# Patient Record
Sex: Female | Born: 1975 | Race: White | Hispanic: No | Marital: Married | Smoking: Never smoker
Health system: Southern US, Community
[De-identification: ages and names within clinical notes are randomized; demographics above are authoritative.]

## PROBLEM LIST (undated history)

## (undated) DIAGNOSIS — F419 Anxiety disorder, unspecified: Secondary | ICD-10-CM

## (undated) DIAGNOSIS — I10 Essential (primary) hypertension: Secondary | ICD-10-CM

## (undated) DIAGNOSIS — G4733 Obstructive sleep apnea (adult) (pediatric): Secondary | ICD-10-CM

## (undated) DIAGNOSIS — M1990 Unspecified osteoarthritis, unspecified site: Secondary | ICD-10-CM

## (undated) DIAGNOSIS — E119 Type 2 diabetes mellitus without complications: Secondary | ICD-10-CM

## (undated) DIAGNOSIS — K219 Gastro-esophageal reflux disease without esophagitis: Secondary | ICD-10-CM

## (undated) DIAGNOSIS — N920 Excessive and frequent menstruation with regular cycle: Secondary | ICD-10-CM

## (undated) DIAGNOSIS — Z9989 Dependence on other enabling machines and devices: Secondary | ICD-10-CM

## (undated) DIAGNOSIS — D509 Iron deficiency anemia, unspecified: Secondary | ICD-10-CM

## (undated) DIAGNOSIS — F32A Depression, unspecified: Secondary | ICD-10-CM

## (undated) DIAGNOSIS — J45909 Unspecified asthma, uncomplicated: Secondary | ICD-10-CM

## (undated) HISTORY — PX: CARPAL TUNNEL RELEASE: SHX101

## (undated) HISTORY — DX: Dependence on other enabling machines and devices: Z99.89

## (undated) HISTORY — PX: CHOLECYSTECTOMY: SHX55

## (undated) HISTORY — DX: Iron deficiency anemia, unspecified: D50.9

## (undated) HISTORY — PX: GASTROPLASTY DUODENAL SWITCH: SHX1699

## (undated) HISTORY — DX: Excessive and frequent menstruation with regular cycle: N92.0

## (undated) HISTORY — PX: BARIATRIC SURGERY: SHX1103

## (undated) HISTORY — DX: Obstructive sleep apnea (adult) (pediatric): G47.33

## (undated) HISTORY — PX: OTHER SURGICAL HISTORY: SHX169

## (undated) HISTORY — DX: Gastro-esophageal reflux disease without esophagitis: K21.9

## (undated) HISTORY — DX: Type 2 diabetes mellitus without complications: E11.9

---

## 2014-08-09 ENCOUNTER — Ambulatory Visit: Payer: Self-pay | Admitting: Podiatry

## 2014-09-09 ENCOUNTER — Other Ambulatory Visit: Payer: Self-pay | Admitting: Family Medicine

## 2014-09-09 DIAGNOSIS — M25561 Pain in right knee: Secondary | ICD-10-CM

## 2014-09-15 ENCOUNTER — Ambulatory Visit
Admission: RE | Admit: 2014-09-15 | Discharge: 2014-09-15 | Disposition: A | Discharge status: Home / Self Care | Payer: Commercial Indemnity | Source: Ambulatory Visit | Attending: Family Medicine | Admitting: Family Medicine

## 2014-09-15 DIAGNOSIS — M25461 Effusion, right knee: Secondary | ICD-10-CM | POA: Insufficient documentation

## 2014-09-15 DIAGNOSIS — M25561 Pain in right knee: Secondary | ICD-10-CM

## 2014-09-15 DIAGNOSIS — M76891 Other specified enthesopathies of right lower limb, excluding foot: Secondary | ICD-10-CM | POA: Insufficient documentation

## 2014-12-06 ENCOUNTER — Ambulatory Visit (INDEPENDENT_AMBULATORY_CARE_PROVIDER_SITE_OTHER): Payer: Commercial Indemnity

## 2014-12-06 ENCOUNTER — Ambulatory Visit (INDEPENDENT_AMBULATORY_CARE_PROVIDER_SITE_OTHER): Payer: Commercial Indemnity | Admitting: Podiatry

## 2014-12-06 VITALS — BP 137/73 | HR 75 | Resp 16

## 2014-12-06 DIAGNOSIS — M7661 Achilles tendinitis, right leg: Secondary | ICD-10-CM

## 2014-12-06 DIAGNOSIS — M722 Plantar fascial fibromatosis: Secondary | ICD-10-CM

## 2014-12-06 DIAGNOSIS — M7662 Achilles tendinitis, left leg: Secondary | ICD-10-CM | POA: Diagnosis not present

## 2014-12-06 DIAGNOSIS — M79673 Pain in unspecified foot: Secondary | ICD-10-CM

## 2014-12-06 MED ORDER — TRIAMCINOLONE ACETONIDE 10 MG/ML IJ SUSP
10.0000 mg | Freq: Once | INTRAMUSCULAR | Status: AC
  Administered 2014-12-06: 10 mg

## 2014-12-06 NOTE — Patient Instructions (Signed)

## 2014-12-06 NOTE — Progress Notes (Signed)
   Subjective:    Patient ID: Danielle Lin, female    DOB: [birth year], 39 y.o.   MRN: [id number]  HPI  Pt presents with bilateral heel pain that radiates upward toward achilles, pain is constant but worse after prolonged standing tried, inserts, injections, motrin when needed. Nothing seems to help  Review of Systems  All other systems reviewed and are negative.      Objective:   Physical Exam        Assessment & Plan:

## 2014-12-07 NOTE — Progress Notes (Signed)
Subjective:     Patient ID: Danielle Lin, female   DOB: 10-02-1975, 39 y.o.   MRN: [id number]  HPI patient presents with significant discomfort in both the posterior plantar aspects of both heels with the left posterior heel very tender in the plantar heel tender bilateral. She does have quite a bit of obesity and does appear to have significant leg lymphedema which puts stress against her heels. States it's been present for several years   Review of Systems  All other systems reviewed and are negative.      Objective:   Physical Exam  Constitutional: She is oriented to person, place, and time.  Cardiovascular: Intact distal pulses.   Musculoskeletal: Normal range of motion.  Neurological: She is oriented to person, place, and time.  Skin: Skin is warm.  Nursing note and vitals reviewed.  neurovascular status intact muscle strength adequate with range of motion within normal limits. Patient's found to have extreme lower leg and upper leg edema that's most likely lymphatic in nature and is noted to have quite a bit of discomfort in the posterior aspect of the heel left over right and the plantar aspect of the heel bilateral which is moderately tender. Patient does have good digital perfusion and is well oriented 3     Assessment:     Significant edema which may be part of which creates the stress on her posterior plantar heels with acute inflammation noted    Plan:     H&P and all conditions discussed. Today I did go ahead and injected the left plantar fascia 3 mg Kenalog 5 mg Xylocaine and went ahead and discussed long-term shockwave therapy as I do not recommend open surgery was necessary patient wants to undergo this and is scheduled for left posterior shockwave therapy and had injections of the heels of both.at this time

## 2014-12-16 ENCOUNTER — Ambulatory Visit (INDEPENDENT_AMBULATORY_CARE_PROVIDER_SITE_OTHER): Payer: Commercial Indemnity | Admitting: Podiatry

## 2014-12-16 DIAGNOSIS — M7662 Achilles tendinitis, left leg: Secondary | ICD-10-CM

## 2014-12-16 DIAGNOSIS — M7661 Achilles tendinitis, right leg: Secondary | ICD-10-CM

## 2014-12-16 DIAGNOSIS — M722 Plantar fascial fibromatosis: Secondary | ICD-10-CM

## 2014-12-16 NOTE — Progress Notes (Signed)
Subjective:     Patient ID: Danielle Lin, female   DOB: [birth year], 39 y.o.   MRN: [id number]  HPI patient presents with posterior heel pain left over right with obesity is complicating factors and states the bottom of the heel is doing better   Review of Systems     Objective:   Physical Exam Neurovascular status is intact with posterior lateral pain in the calcaneus at the insertion of the Achilles tendon    Assessment:     Achilles tendinitis left over right    Plan:     Shockwave therapy administered 3000 shocks at 4.2 intensity 17 frequency tolerated well

## 2014-12-23 ENCOUNTER — Ambulatory Visit (INDEPENDENT_AMBULATORY_CARE_PROVIDER_SITE_OTHER): Payer: Commercial Indemnity

## 2014-12-23 ENCOUNTER — Ambulatory Visit: Payer: Commercial Indemnity | Admitting: Podiatry

## 2014-12-23 DIAGNOSIS — M7662 Achilles tendinitis, left leg: Secondary | ICD-10-CM

## 2014-12-23 DIAGNOSIS — M722 Plantar fascial fibromatosis: Secondary | ICD-10-CM

## 2014-12-23 DIAGNOSIS — M7661 Achilles tendinitis, right leg: Secondary | ICD-10-CM

## 2014-12-23 NOTE — Progress Notes (Signed)
Patient ID: Danielle Lin, female   DOB: 10/29/1975, 39 y.o.   MRN: [id number] Pt presents with posterior heel pain left over right with obesity as complicating factor and states bottom of her heel is feeling better but the dorsal foot has started to hurt  Neurovascular status is intact with posterior lateral pain in the calcaneus at the insertion of the achilles tendon  Achilles tendonitis left over right  Shockwave therapy administered 3000 shocks at 4.6 intensity 17 frequency tolerated well  Advised to ice the top of her foot and keep retreat/follow up appt for next week

## 2014-12-29 ENCOUNTER — Ambulatory Visit (INDEPENDENT_AMBULATORY_CARE_PROVIDER_SITE_OTHER): Payer: Commercial Indemnity | Admitting: Podiatry

## 2014-12-29 ENCOUNTER — Encounter: Payer: Self-pay | Admitting: Podiatry

## 2014-12-29 DIAGNOSIS — M7662 Achilles tendinitis, left leg: Secondary | ICD-10-CM

## 2014-12-29 DIAGNOSIS — M7661 Achilles tendinitis, right leg: Secondary | ICD-10-CM

## 2014-12-29 DIAGNOSIS — M79673 Pain in unspecified foot: Secondary | ICD-10-CM

## 2014-12-29 NOTE — Progress Notes (Signed)
Subjective:     Patient ID: Danielle Lin, female   DOB: Aug 24, 1975, 39 y.o.   MRN: [id number]  HPI patient states I seem to be doing a little better with pain still noted   Review of Systems     Objective:   Physical Exam Neurovascular status intact with diminishment of discomfort in the posterior lateral aspect left heel insertion Achilles    Assessment:     Achilles tendinitis which is showing gradual improvement    Plan:     Shockwave administered 3000 shocks at 4.4 intensity tolerated well and reappoint one month

## 2015-01-19 ENCOUNTER — Other Ambulatory Visit: Payer: Self-pay | Admitting: Bariatrics

## 2015-01-20 ENCOUNTER — Other Ambulatory Visit: Payer: Self-pay | Admitting: Physician Assistant

## 2015-01-20 ENCOUNTER — Other Ambulatory Visit (HOSPITAL_COMMUNITY)
Admission: RE | Admit: 2015-01-20 | Discharge: 2015-01-20 | Disposition: A | Discharge status: Home / Self Care | Payer: Managed Care, Other (non HMO) | Source: Ambulatory Visit | Attending: Family Medicine | Admitting: Family Medicine

## 2015-01-20 DIAGNOSIS — Z124 Encounter for screening for malignant neoplasm of cervix: Secondary | ICD-10-CM | POA: Insufficient documentation

## 2015-01-24 LAB — CYTOLOGY - PAP

## 2015-01-26 ENCOUNTER — Ambulatory Visit (INDEPENDENT_AMBULATORY_CARE_PROVIDER_SITE_OTHER): Payer: Commercial Indemnity | Admitting: Podiatry

## 2015-01-26 ENCOUNTER — Encounter: Payer: Self-pay | Admitting: Podiatry

## 2015-01-26 DIAGNOSIS — M7661 Achilles tendinitis, right leg: Secondary | ICD-10-CM

## 2015-01-26 DIAGNOSIS — M7662 Achilles tendinitis, left leg: Secondary | ICD-10-CM

## 2015-01-26 NOTE — Progress Notes (Signed)
Subjective:     Patient ID: Danielle Lin, female   DOB: [birth year], 39 y.o.   MRN: [id number]  HPI patient states the heel seems to be a little better   Review of Systems     Objective:   Physical Exam Neurovascular status intact with diminished discomfort in the posterior lateral aspect left heel with continued discomfort and obesity is complicating factors    Assessment:     Achilles tendinitis left    Plan:     Shockwave administered 3000 shocks 4.5 intensity 17 frequency and reappoint to reevaluate

## 2015-02-02 ENCOUNTER — Other Ambulatory Visit: Payer: Self-pay | Admitting: Bariatrics

## 2015-02-07 ENCOUNTER — Ambulatory Visit
Admission: RE | Admit: 2015-02-07 | Discharge: 2015-02-07 | Disposition: A | Discharge status: Home / Self Care | Payer: Managed Care, Other (non HMO) | Source: Ambulatory Visit | Attending: Bariatrics | Admitting: Bariatrics

## 2015-02-07 DIAGNOSIS — Z01818 Encounter for other preprocedural examination: Secondary | ICD-10-CM | POA: Insufficient documentation

## 2015-02-07 DIAGNOSIS — Z01810 Encounter for preprocedural cardiovascular examination: Secondary | ICD-10-CM | POA: Diagnosis not present

## 2015-02-25 ENCOUNTER — Ambulatory Visit (INDEPENDENT_AMBULATORY_CARE_PROVIDER_SITE_OTHER): Payer: Commercial Indemnity | Admitting: Podiatry

## 2015-02-25 DIAGNOSIS — M722 Plantar fascial fibromatosis: Secondary | ICD-10-CM

## 2015-02-27 NOTE — Progress Notes (Signed)
Subjective:     Patient ID: Danielle Lin, female   DOB: [birth year], 39 y.o.   MRN: [id number]  HPI patient states I'm improved with discomfort still present   Review of Systems     Objective:   Physical Exam Neurovascular status intact with obese female with improvement around the left posterior heel that's present but better than it was    Assessment:     Doing better with laser therapy    Plan:     Shockwave administered 3000 shocks 17 frequency 4.2 intensity tolerated well and reappoint as needed

## 2015-04-28 ENCOUNTER — Ambulatory Visit: Payer: Commercial Indemnity | Admitting: Podiatry

## 2015-05-11 ENCOUNTER — Other Ambulatory Visit: Payer: Self-pay | Admitting: Bariatrics

## 2015-05-11 ENCOUNTER — Ambulatory Visit
Admission: RE | Admit: 2015-05-11 | Discharge: 2015-05-11 | Disposition: A | Discharge status: Home / Self Care | Payer: Managed Care, Other (non HMO) | Source: Ambulatory Visit | Attending: Bariatrics | Admitting: Bariatrics

## 2015-05-11 DIAGNOSIS — M25562 Pain in left knee: Secondary | ICD-10-CM | POA: Diagnosis not present

## 2015-05-11 DIAGNOSIS — M25561 Pain in right knee: Secondary | ICD-10-CM | POA: Insufficient documentation

## 2015-05-13 ENCOUNTER — Emergency Department
Admission: EM | Admit: 2015-05-13 | Discharge: 2015-05-14 | Disposition: A | Discharge status: Home / Self Care | Payer: Managed Care, Other (non HMO) | Attending: Emergency Medicine | Admitting: Emergency Medicine

## 2015-05-13 DIAGNOSIS — I10 Essential (primary) hypertension: Secondary | ICD-10-CM | POA: Diagnosis not present

## 2015-05-13 DIAGNOSIS — E119 Type 2 diabetes mellitus without complications: Secondary | ICD-10-CM | POA: Diagnosis not present

## 2015-05-13 DIAGNOSIS — Z79899 Other long term (current) drug therapy: Secondary | ICD-10-CM | POA: Insufficient documentation

## 2015-05-13 DIAGNOSIS — G8918 Other acute postprocedural pain: Secondary | ICD-10-CM | POA: Insufficient documentation

## 2015-05-13 DIAGNOSIS — R55 Syncope and collapse: Secondary | ICD-10-CM

## 2015-05-13 DIAGNOSIS — R1011 Right upper quadrant pain: Secondary | ICD-10-CM | POA: Insufficient documentation

## 2015-05-13 HISTORY — DX: Essential (primary) hypertension: I10

## 2015-05-13 LAB — CBC
HCT: 39.4 % (ref 35.0–47.0)
Hemoglobin: 12.9 g/dL (ref 12.0–16.0)
MCH: 30.4 pg (ref 26.0–34.0)
MCHC: 32.7 g/dL (ref 32.0–36.0)
MCV: 92.9 fL (ref 80.0–100.0)
PLATELETS: 230 10*3/uL (ref 150–440)
RBC: 4.24 MIL/uL (ref 3.80–5.20)
RDW: 14.9 % — ABNORMAL HIGH (ref 11.5–14.5)
WBC: 7 10*3/uL (ref 3.6–11.0)

## 2015-05-13 LAB — URINALYSIS COMPLETE WITH MICROSCOPIC (ARMC ONLY)
BILIRUBIN URINE: NEGATIVE
Bacteria, UA: NONE SEEN
Glucose, UA: NEGATIVE mg/dL
Hgb urine dipstick: NEGATIVE
KETONES UR: NEGATIVE mg/dL
Leukocytes, UA: NEGATIVE
Nitrite: NEGATIVE
PROTEIN: NEGATIVE mg/dL
Specific Gravity, Urine: 1.021 (ref 1.005–1.030)
pH: 6 (ref 5.0–8.0)

## 2015-05-13 LAB — BASIC METABOLIC PANEL
Anion gap: 6 (ref 5–15)
BUN: 8 mg/dL (ref 6–20)
CALCIUM: 9.3 mg/dL (ref 8.9–10.3)
CO2: 29 mmol/L (ref 22–32)
CREATININE: 0.59 mg/dL (ref 0.44–1.00)
Chloride: 105 mmol/L (ref 101–111)
GFR calc Af Amer: >60 mL/min (ref >60)
GFR calc non Af Amer: >60 mL/min (ref >60)
GLUCOSE: 108 mg/dL — AB (ref 65–99)
Potassium: 4.3 mmol/L (ref 3.5–5.1)
Sodium: 140 mmol/L (ref 135–145)

## 2015-05-13 MED ORDER — SODIUM CHLORIDE 0.9 % IV BOLUS (SEPSIS)
1000.0000 mL | Freq: Once | INTRAVENOUS | Status: AC
  Administered 2015-05-13: 1000 mL via INTRAVENOUS

## 2015-05-13 MED ORDER — OXYCODONE-ACETAMINOPHEN 5-325 MG PO TABS
1.0000 | ORAL_TABLET | Freq: Once | ORAL | Status: DC
  Filled 2015-05-13: qty 1

## 2015-05-13 MED ORDER — OXYCODONE HCL 5 MG/5ML PO SOLN
5.0000 mg | Freq: Once | ORAL | Status: AC
  Administered 2015-05-13: 5 mg via ORAL
  Filled 2015-05-13: qty 5

## 2015-05-13 NOTE — Discharge Instructions (Signed)
Syncope °Syncope means a person passes out (faints). The person usually wakes up in less than 5 minutes. It is important to seek medical care for syncope. °HOME CARE °· Have someone stay with you until you feel normal. °· Do not drive, use machines, or play sports until your doctor says it is okay. °· Keep all doctor visits as told. °· Lie down when you feel like you might pass out. Take deep breaths. Wait until you feel normal before standing up. °· Drink enough fluids to keep your pee (urine) clear or pale yellow. °· If you take blood pressure or heart medicine, get up slowly. Take several minutes to sit and then stand. °GET HELP RIGHT AWAY IF:  °· You have a severe headache. °· You have pain in the chest, belly (abdomen), or back. °· You are bleeding from the mouth or butt (rectum). °· You have black or tarry poop (stool). °· You have an irregular or very fast heartbeat. °· You have pain with breathing. °· You keep passing out, or you have shaking (seizures) when you pass out. °· You pass out when sitting or lying down. °· You feel confused. °· You have trouble walking. °· You have severe weakness. °· You have vision problems. °If you fainted, call for help (911 in [place] Do not drive yourself to the hospital. °  °This information is not intended to replace advice given to you by your health care provider. Make sure you discuss any questions you have with your health care provider. °  °Document Released: 10/03/2007 Document Revised: 08/31/2014 Document Reviewed: 06/15/2011 °Elsevier Interactive Patient Education ©2016 [employer] ° °

## 2015-05-13 NOTE — ED Provider Notes (Signed)
[facility] Emergency Department Provider Note  ____________________________________________  Time seen: Approximately [id number] PM  I have reviewed the triage vital signs and the nursing notes.   HISTORY  Chief Complaint Loss of Consciousness    HPI Danielle Lin is a 40 y.o. female with a recent history of a duodenal switch bariatric surgery who is presenting after syncopal episode 24 hours ago. The patient said that she was in the shower when she began to feel nauseous and lightheaded and then passed out. She denied any chest pain and was caught by her husband and did not sustain any trauma. She says that since her surgery 1 week ago that she has not been able to take her required 64 ounces of fluids per day and has been in contact with her surgeon. She said that she has also had persistent pain over her incision site especially on the right upper quadrant where she had her gallbladder taken out. She is not had any vomiting. Has not had any further episodes of syncope. Has never had an episode of syncope in the past.   Past Medical History  Diagnosis Date  . Hypertension   . Diabetes mellitus without complication ([place])     There are no active problems to display for this patient.   Past Surgical History  Procedure Laterality Date  . Cholecystectomy    . Carpal tunnel release Bilateral   . Gastroplasty duodenal switch      Current Outpatient Rx  Name  Route  Sig  Dispense  Refill  . benazepril-hydrochlorthiazide (LOTENSIN HCT) 10-12.5 MG per tablet   Oral   Take 1 tablet by mouth daily.         . citalopram (CELEXA) 40 MG tablet   Oral   Take 40 mg by mouth daily.         . ferrous sulfate 325 (65 FE) MG tablet   Oral   Take 325 mg by mouth daily with breakfast.         . omeprazole (PRILOSEC) 20 MG capsule   Oral   Take 20 mg by mouth daily.         . vitamin B-12 (CYANOCOBALAMIN) 100 MCG tablet   Oral   Take 100 mcg by mouth  daily.           Allergies Review of patient's allergies indicates no known allergies.  No family history on file.  Social History Social History  Substance Use Topics  . Smoking status: Never Smoker   . Smokeless tobacco: None  . Alcohol Use: No    Review of Systems Constitutional: No fever/chills Eyes: No visual changes. ENT: No sore throat. Cardiovascular: Denies chest pain. Respiratory: Denies shortness of breath. Gastrointestinal:  no vomiting.   No constipation. Genitourinary: Negative for dysuria. Musculoskeletal: Negative for back pain. Skin: Negative for rash. Neurological: Negative for headaches, focal weakness or numbness.  10-point ROS otherwise negative.  ____________________________________________   PHYSICAL EXAM:  VITAL SIGNS: ED Triage Vitals  Enc Vitals Group     BP 05/13/15 1753 138/71 mmHg     Pulse Rate 05/13/15 1753 58     Resp 05/13/15 1753 17     Temp 05/13/15 1753 98.5 F (36.9 C)     Temp Source 05/13/15 1753 Oral     SpO2 05/13/15 1753 99 %     Weight 05/13/15 1753 281 lb (127.461 kg)     Height 05/13/15 1753 4\' 11"  (1.499 m)     Head  Cir --      Peak Flow --      Pain Score 05/13/15 1753 10     Pain Loc --      Pain Edu? --      Excl. in [place]? --     Constitutional: Alert and oriented. Well appearing and in no acute distress. Eyes: Conjunctivae are normal. PERRL. EOMI. Head: Atraumatic. Nose: No congestion/rhinnorhea. Mouth/Throat: Mucous membranes are moist.  Oropharynx non-erythematous. Neck: No stridor.   Cardiovascular: Normal rate, regular rhythm. Grossly normal heart sounds.  Good peripheral circulation. Respiratory: Normal respiratory effort.  No retractions. Lungs CTAB. Gastrointestinal: Soft with mild tenderness to the right upper quadrant over her incision site which appears well-healing and without any surrounding induration, dehiscence, pus. No distention. No abdominal bruits. No CVA tenderness. Musculoskeletal:  No lower extremity tenderness nor edema.  No joint effusions. Neurologic:  Normal speech and language. No gross focal neurologic deficits are appreciated. No gait instability. Skin:  Skin is warm, dry and intact. No rash noted. Psychiatric: Mood and affect are normal. Speech and behavior are normal.  ____________________________________________   LABS (all labs ordered are listed, but only abnormal results are displayed)  Labs Reviewed  BASIC METABOLIC PANEL - Abnormal; Notable for the following:    Glucose, Bld 108 (*)    All other components within normal limits  CBC - Abnormal; Notable for the following:    RDW 14.9 (*)    All other components within normal limits  URINALYSIS COMPLETEWITH MICROSCOPIC ([facility] ONLY) - Abnormal; Notable for the following:    Color, Urine AMBER (*)    APPearance HAZY (*)    Squamous Epithelial / LPF 0-5 (*)    All other components within normal limits  CBG MONITORING, ED   ____________________________________________  EKG  ED ECG REPORT I, Doran Stabler, the attending physician, personally viewed and interpreted this ECG.   Date: 05/13/2015  EKG Time: 1811  Rate: 56  Rhythm: sinus bradycardia  Axis: Normal axis  Intervals:none  ST&T Change: No ST segment elevation or depression. No abnormal T-wave inversion.  ____________________________________________  RADIOLOGY   ____________________________________________   PROCEDURES   ____________________________________________   INITIAL IMPRESSION / ASSESSMENT AND PLAN / ED COURSE  Pertinent labs & imaging results that were available during my care of the patient were reviewed by me and considered in my medical decision making (see chart for details).  ----------------------------------------- 11:06 PM on 05/13/2015 -----------------------------------------  Patient feeling at baseline now without any lightheadedness. Pain improved after oxycodone on the right side of her  abdomen. Discussed in detail making sure to sit if she begins to feel lightheaded and especially if she is in the shower begins to feel lightheaded. She knows to try to meet her goals as far as her fluid and protein intake scope. She will call her surgeon for follow-up appointment to see within the next week. ____________________________________________   FINAL CLINICAL IMPRESSION(S) / ED DIAGNOSES  Vasovagal syncope.    Orbie Pyo, MD 05/13/15 [phone]

## 2015-05-13 NOTE — ED Notes (Signed)
Pt states she had duodenal switch 2 weeks ago and had a syncopal episode last night and was told to come to the ED for eval.. States she has not been able to take in as much fluid as she is suppose to.. Denies vomiting but has a lot of nausea and tenderness to the surgical site. States she was in the shower when this occurred with her husband there who assisted her to the floor.Marland Kitchen

## 2016-02-06 ENCOUNTER — Ambulatory Visit: Payer: Managed Care, Other (non HMO) | Attending: Family Medicine | Admitting: Occupational Therapy

## 2016-02-06 ENCOUNTER — Encounter: Payer: Self-pay | Admitting: Occupational Therapy

## 2016-02-06 VITALS — Ht 59.0 in

## 2016-02-06 DIAGNOSIS — I890 Lymphedema, not elsewhere classified: Secondary | ICD-10-CM | POA: Insufficient documentation

## 2016-02-13 ENCOUNTER — Ambulatory Visit: Payer: Managed Care, Other (non HMO) | Admitting: Occupational Therapy

## 2016-02-13 DIAGNOSIS — I890 Lymphedema, not elsewhere classified: Secondary | ICD-10-CM

## 2016-02-13 NOTE — Patient Instructions (Signed)

## 2016-02-15 ENCOUNTER — Ambulatory Visit: Payer: Managed Care, Other (non HMO) | Admitting: Occupational Therapy

## 2016-02-15 DIAGNOSIS — I890 Lymphedema, not elsewhere classified: Secondary | ICD-10-CM

## 2016-02-15 NOTE — Patient Instructions (Signed)
LE instructions and precautions as established- see initial eval.   

## 2016-02-15 NOTE — Therapy (Signed)
[place] MAIN [facility] SERVICES [place], [place], [address] Phone: [phone]   Fax:  [phone]  Occupational Therapy Evaluation  Patient Details  Name: Danielle Lin MRN: [id number] Date of Birth: 12/18/1975 No Data Recorded  Encounter Date: 02/06/2016    Past Medical History:  Diagnosis Date  . Hypertension     Past Surgical History:  Procedure Laterality Date  . CARPAL TUNNEL RELEASE Bilateral   . CHOLECYSTECTOMY    . GASTROPLASTY DUODENAL SWITCH      Vitals:   02/06/16 1521  Height: 4\' 11"  (1.499 m)           [facility] OT Assessment - 02/15/16 0001      Sensation   Additional Comments WNL     Coordination   Gross Motor Movements are Fluid and Coordinated Yes   Fine Motor Movements are Fluid and Coordinated Yes          LYMPHEDEMA/ONCOLOGY QUESTIONNAIRE - 02/15/16 1217      What other symptoms do you have   Are you Having Heaviness or Tightness Yes   Are you having Pain Yes   Are you having pitting edema No   Body Site BLE/ BLQ  ankles to buttocks   Is it Hard or Difficult finding clothes that fit Yes   Do you have infections No   [place]mer Sign Yes     Lymphedema Assessments   Lymphedema Assessments Lower extremities     Right Lower Extremity Lymphedema   Other BLE Comparative limb volumetrics TBA at first CDT treatment visit                OT Treatments/Exercises (OP) - 02/15/16 0001      Transfers   Transfers --  difficulty w/ car transfers 2/2 LE pain and swelling     ADLs   LB Dressing difficulty fitting LB clothing 2/2 LB swelling and body habitus associated w/ Lipo-lymphedema   Bathing Difficulty reaching feet to bathe and perform skin care due to body habitus associated w/ lipo-lymphedema   Functional Mobility Standing and walking limited by LE pain and swelling   ADL Comments Progressive discomfort and body disfigurement contributes to poor body image, self consciousness,  low self esteme,  and frustration with body shape despite bariatric surgery     Manual Therapy   Manual Therapy Edema management                    OT Long Term Goals - 02/15/16 1222      OT LONG TERM GOAL #1   Title Lymphedema (LE) management/ self-care: Pt able to apply multi layered, gradient compression wraps with min caregiver assistance using proper techniques within 2 weeks to achieve optimal limb volume reduction.   Baseline dependent for LE self care   Time 2   Period Weeks   Status New     OT LONG TERM GOAL #2   Title Lymphedema (LE) management/ self-care:  Pt to achieve at least 10% LLE limb volume reductions bilaterally during Intensive CDT to limit LE progression, decrease infection and falls risk, to reduce pain/, and to improve safe ambulation and functional mobility.   Baseline dependent for LE self care   Time 12   Period Weeks   Status New     OT LONG TERM GOAL #3   Title Lymphedema (LE) management/ self-care:  Pt >/= 85 % compliant with all daily, LE self-care protocols for home program w/ needed level  of caregiver assistance , including simple self-manual lymphatic drainage (MLD), skin care, lymphatic pumping the ex, skin care, and donning/ doffing compression wraps and garments o limit LE progression and further functional decline.     Baseline dependent for LE self care   Time 12   Period Weeks   Status New     OT LONG TERM GOAL #4   Title Lymphedema (LE) management/ self-care:  Pt to tolerate daily compression wraps, garments and devices in keeping w/ prescribed wear regime within 1 week of issue date to progress and retain clinical and functional gains and to limit LE progression.   Baseline dependent for LE self care   Time 12   Period Weeks   Status New     OT LONG TERM GOAL #5   Title Lymphedema (LE) self-care:  During Management Phase CDT Pt to sustain limb volume reductions achieved during Intensive Phase CDT within 5% utilizing LE  self-care protocols, appropriate compression garments/ devices, and needed level of caregiver assistance.   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 Painful BLE leg swelling and pain limits basic and instrumental ADLs performance, participation in productive and leisure activities, limits social participation. Associated body disfigurment and discomfort contributes to psycological distress, depression, and  low self esteme.Pt will benefit from skilled Occupational Therapy for Complete Decongestive Therapy (CDT) to decrease BLE/ BLQ swelling, to decrease doiscomfort , and to improve functional performance, and to limit progression  of this chronic   disease. Without skilled OT, Pt's condition will worsen and further functional decline is expected.   Rehab Potential Good   OT Frequency 3x / week   OT Duration 12 weeks   OT Treatment/Interventions Self-care/ADL training;Therapeutic exercise;Patient/family education;Manual Therapy;Manual lymph drainage;Other (comment);DME and/or AE instruction;Compression bandaging;Therapeutic activities  skin care   Plan Treat RLE first, then L in succession. Fit w/ custom daytime compression garments and HOS devices PRN. Consider ccl 2 Custom Elvarex capri panty hose ending at B1 and BLE ccl 2 A-D knee highs.  For HOS consider [employer]fort A-D- donated if possible      Patient will benefit from skilled therapeutic intervention in order to improve the following deficits and impairments:  Abnormal gait, Decreased skin integrity, Decreased activity tolerance, Decreased knowledge of use of DME, Impaired flexibility, Decreased balance, Decreased mobility, Difficulty walking, Obesity, Increased edema, Pain  Visit Diagnosis: Lymphedema, not elsewhere classified - Plan: Ot plan of  care cert/re-cert    Problem List There are no active problems to display for this patient.   Andrey Spearman, MS, OTR/L, [facility] 02/15/16 12:34 PM  [place] MAIN [facility] SERVICES [address] [place], [place], [address] Phone: [phone]   Fax:  [phone]  Name: Sirenity Gilsdorf MRN: [id number] Date of Birth: 1976/04/21

## 2016-02-15 NOTE — Therapy (Signed)
[place] MAIN [facility] SERVICES [address] [place], [place], [address] Phone: [phone]   Fax:  [phone]  Occupational Therapy Treatment  Patient Details  Name: Danielle Lin MRN: [id number] Date of Birth: March 31, 1976 No Data Recorded  Encounter Date: 02/13/2016    Past Medical History:  Diagnosis Date  . Hypertension     Past Surgical History:  Procedure Laterality Date  . CARPAL TUNNEL RELEASE Bilateral   . CHOLECYSTECTOMY    . GASTROPLASTY DUODENAL SWITCH      There were no vitals filed for this visit.      Subjective Assessment - 02/15/16 1514    Subjective  Pt presents for OT treatment visit 2 to address moderate, Stage II, BLE/BLQ Lipo-lymphedema. Pt is unaccompanied today.   Pertinent History SIPPS sx 04/25/15 w/ weight at 306 lbs. Current weight 212.6 lbs. HTN, Asthma, OSA ( uses CPAP) B CTR   Limitations BLE chronic leg pain and swelling, L>R, difficulty walking, decreased standing tolerance, decreased activity tolerance, difficulty w/ functional mobility/ transfers   Patient Stated Goals decrease leg swelling and pain and keep it from getting worse.   Currently in Pain? Yes  not rated numerically   Pain Location Leg   Pain Descriptors / Indicators Sore;Heaviness;Aching   Pain Type Chronic pain   Pain Onset More than a month ago            [facility] OT Assessment - 02/15/16 0001      Sensation   Additional Comments WNL     Coordination   Gross Motor Movements are Fluid and Coordinated Yes   Fine Motor Movements are Fluid and Coordinated Yes         LYMPHEDEMA/ONCOLOGY QUESTIONNAIRE - 02/15/16 1312      Right Lower Extremity Lymphedema   Other RLE A-D ( below the knee) limb volume=6960.73 ml   Other RLE A-G ( ankle to groin) limb volume= 9018.93 ml     Left Lower Extremity Lymphedema   Other LLE A-D ( below the knee) limb volume=7684.94 ml   Other LLE A-G ( ankle to groin) limb volume= 9493.46 ml   Other A-D Limb volume differential -= 9.4%, L>R;  A-G LVD= 5.0%                 OT Treatments/Exercises (OP) - 02/15/16 0001      Transfers   Transfers --  difficulty w/ car transfers 2/2 LE pain and swelling     ADLs   LB Dressing difficulty fitting LB clothing 2/2 LB swelling and body habitus associated w/ Lipo-lymphedema   Bathing Difficulty reaching feet to bathe and perform skin care due to body habitus associated w/ lipo-lymphedema   Functional Mobility Standing and walking limited by LE pain and swelling   ADL Comments Progressive discomfort and body disfigurement contributes to poor body image, self consciousness, low self esteme,  and frustration with body shape despite bariatric surgery     Manual Therapy   Manual Therapy Edema management                OT Education - 02/15/16 1517    Education provided Yes   Education Details Emphasis of Pt edu for LE self care focused on gradient compression wrapping from ankle to groin   Person(s) Educated Patient   Methods Explanation;Demonstration;Tactile cues;Verbal cues;Handout   Comprehension Verbalized understanding             OT Long Term Goals - 02/15/16 1222  OT LONG TERM GOAL #1   Title Lymphedema (LE) management/ self-care: Pt able to apply multi layered, gradient compression wraps with min caregiver assistance using proper techniques within 2 weeks to achieve optimal limb volume reduction.   Baseline dependent for LE self care   Time 2   Period Weeks   Status New     OT LONG TERM GOAL #2   Title Lymphedema (LE) management/ self-care:  Pt to achieve at least 10% LLE limb volume reductions bilaterally during Intensive CDT to limit LE progression, decrease infection and falls risk, to reduce pain/, and to improve safe ambulation and functional mobility.   Baseline dependent for LE self care   Time 12   Period Weeks   Status New     OT LONG TERM GOAL #3   Title Lymphedema (LE) management/  self-care:  Pt >/= 85 % compliant with all daily, LE self-care protocols for home program w/ needed level of caregiver assistance , including simple self-manual lymphatic drainage (MLD), skin care, lymphatic pumping the ex, skin care, and donning/ doffing compression wraps and garments o limit LE progression and further functional decline.     Baseline dependent for LE self care   Time 12   Period Weeks   Status New     OT LONG TERM GOAL #4   Title Lymphedema (LE) management/ self-care:  Pt to tolerate daily compression wraps, garments and devices in keeping w/ prescribed wear regime within 1 week of issue date to progress and retain clinical and functional gains and to limit LE progression.   Baseline dependent for LE self care   Time 12   Period Weeks   Status New     OT LONG TERM GOAL #5   Title Lymphedema (LE) self-care:  During Management Phase CDT Pt to sustain limb volume reductions achieved during Intensive Phase CDT within 5% utilizing LE self-care protocols, appropriate compression garments/ devices, and needed level of caregiver assistance.   Baseline dependent for LE self care   Time 12   Period Weeks   Status New               Plan - 02/15/16 1330    Clinical Impression Statement Pt tolerated compression wraps for 24 + hours, despite her report of increased leg pain in wraps when on her feet.    Rehab Potential Good   OT Frequency 3x / week   OT Duration 12 weeks   OT Treatment/Interventions Self-care/ADL training;Therapeutic exercise;Patient/family education;Manual Therapy;Manual lymph drainage;Other (comment);DME and/or AE instruction;Compression bandaging;Therapeutic activities  skin care      Patient will benefit from skilled therapeutic intervention in order to improve the following deficits and impairments:  Abnormal gait, Decreased skin integrity, Decreased activity tolerance, Decreased knowledge of use of DME, Impaired flexibility, Decreased balance,  Decreased mobility, Difficulty walking, Obesity, Increased edema, Pain  Visit Diagnosis: Lymphedema, not elsewhere classified    Problem List There are no active problems to display for this patient.  Andrey Spearman, MS, OTR/L, [facility] 02/15/16 3:43 PM  [place] MAIN [facility] SERVICES [address] [place], [place], [address] Phone: [phone]   Fax:  [phone]  Name: Danielle Lin MRN: [id number] Date of Birth: 1975/05/20

## 2016-02-15 NOTE — Therapy (Signed)
[place] MAIN [facility] SERVICES [address] [place], [place], [address] Phone: [phone]   Fax:  [phone]  Occupational Therapy Treatment  Patient Details  Name: Danielle Lin MRN: [id number] Date of Birth: 1976/01/21 No Data Recorded  Encounter Date: 02/15/2016      OT End of Session - 02/15/16 1548    Visit Number 3   Number of Visits 36   Date for OT Re-Evaluation 05/06/16   OT Start Time 0800   OT Stop Time 0900   OT Time Calculation (min) 60 min   Activity Tolerance Patient tolerated treatment well;No increased pain   Behavior During Therapy WFL for tasks assessed/performed      Past Medical History:  Diagnosis Date  . Hypertension     Past Surgical History:  Procedure Laterality Date  . CARPAL TUNNEL RELEASE Bilateral   . CHOLECYSTECTOMY    . GASTROPLASTY DUODENAL SWITCH      There were no vitals filed for this visit.      Subjective Assessment - 02/15/16 1544    Subjective  Pt presents for OT treatment visit 2 to address moderate, Stage II, BLE/BLQ Lipo-lymphedema. Pt is unaccompanied today. Pt has no new complaints.   Pertinent History SIPPS sx 04/25/15 w/ weight at 306 lbs. Current weight 212.6 lbs. HTN, Asthma, OSA ( uses CPAP) B CTR   Limitations BLE chronic leg pain and swelling, L>R, difficulty walking, decreased standing tolerance, decreased activity tolerance, difficulty w/ functional mobility/ transfers   Patient Stated Goals decrease leg swelling and pain and keep it from getting worse.   Currently in Pain? No/denies   Pain Onset More than a month ago            [facility] OT Assessment - 02/15/16 0001      Sensation   Additional Comments WNL     Coordination   Gross Motor Movements are Fluid and Coordinated Yes   Fine Motor Movements are Fluid and Coordinated Yes         LYMPHEDEMA/ONCOLOGY QUESTIONNAIRE - 02/15/16 1312      Right Lower Extremity Lymphedema   Other RLE A-D ( below the  knee) limb volume=6960.73 ml   Other RLE A-G ( ankle to groin) limb volume= 9018.93 ml     Left Lower Extremity Lymphedema   Other LLE A-D ( below the knee) limb volume=7684.94 ml   Other LLE A-G ( ankle to groin) limb volume= 9493.46 ml   Other A-D Limb volume differential -= 9.4%, L>R;  A-G LVD= 5.0%                 OT Treatments/Exercises (OP) - 02/15/16 1201      Manual Therapy   Manual therapy comments introductory MLD to RLE today   Compression Bandaging LLE gradient compression wraps applied from base of toes to below knee as follows:                OT Education - 02/15/16 1547    Education provided Yes   Education Details Intro level edu for simple self MLD. Cont edu for compression wrapping.   Person(s) Educated Patient   Methods Explanation;Demonstration;Tactile cues;Verbal cues;Handout   Comprehension Verbalized understanding;Need further instruction             OT Long Term Goals - 02/15/16 1222      OT LONG TERM GOAL #1   Title Lymphedema (LE) management/ self-care: Pt able to apply multi layered, gradient compression wraps with min  caregiver assistance using proper techniques within 2 weeks to achieve optimal limb volume reduction.   Baseline dependent for LE self care   Time 2   Period Weeks   Status New     OT LONG TERM GOAL #2   Title Lymphedema (LE) management/ self-care:  Pt to achieve at least 10% LLE limb volume reductions bilaterally during Intensive CDT to limit LE progression, decrease infection and falls risk, to reduce pain/, and to improve safe ambulation and functional mobility.   Baseline dependent for LE self care   Time 12   Period Weeks   Status New     OT LONG TERM GOAL #3   Title Lymphedema (LE) management/ self-care:  Pt >/= 85 % compliant with all daily, LE self-care protocols for home program w/ needed level of caregiver assistance , including simple self-manual lymphatic drainage (MLD), skin care, lymphatic  pumping the ex, skin care, and donning/ doffing compression wraps and garments o limit LE progression and further functional decline.     Baseline dependent for LE self care   Time 12   Period Weeks   Status New     OT LONG TERM GOAL #4   Title Lymphedema (LE) management/ self-care:  Pt to tolerate daily compression wraps, garments and devices in keeping w/ prescribed wear regime within 1 week of issue date to progress and retain clinical and functional gains and to limit LE progression.   Baseline dependent for LE self care   Time 12   Period Weeks   Status New     OT LONG TERM GOAL #5   Title Lymphedema (LE) self-care:  During Management Phase CDT Pt to sustain limb volume reductions achieved during Intensive Phase CDT within 5% utilizing LE self-care protocols, appropriate compression garments/ devices, and needed level of caregiver assistance.   Baseline dependent for LE self care   Time 12   Period Weeks   Status New               Plan - 02/15/16 1330    Clinical Impression Statement Pt tolerated compression wraps for 24 + hours, despite her report of increased leg pain in wraps when on her feet. Pt tolerated MLD today and seemed less anxious about compression wraps by end of session. No visible change in limb volume or tissue density  observed today.   Rehab Potential Good   OT Frequency 3x / week   OT Duration 12 weeks   OT Treatment/Interventions Self-care/ADL training;Therapeutic exercise;Patient/family education;Manual Therapy;Manual lymph drainage;Other (comment);DME and/or AE instruction;Compression bandaging;Therapeutic activities  skin care      Patient will benefit from skilled therapeutic intervention in order to improve the following deficits and impairments:  Abnormal gait, Decreased skin integrity, Decreased activity tolerance, Decreased knowledge of use of DME, Impaired flexibility, Decreased balance, Decreased mobility, Difficulty walking, Obesity,  Increased edema, Pain  Visit Diagnosis: Lymphedema, not elsewhere classified    Problem List There are no active problems to display for this patient.   Andrey Spearman, MS, OTR/L, [facility] 02/15/16 3:51 PM   [place] MAIN [facility] SERVICES [address] [place], [place], [address] Phone: [phone]   Fax:  [phone]  Name: Tiffanie Wentling MRN: [id number] Date of Birth: August 01, 1975

## 2016-02-20 ENCOUNTER — Ambulatory Visit: Payer: Managed Care, Other (non HMO) | Admitting: Occupational Therapy

## 2016-02-20 DIAGNOSIS — I890 Lymphedema, not elsewhere classified: Secondary | ICD-10-CM | POA: Diagnosis not present

## 2016-02-20 NOTE — Therapy (Signed)
[place] MAIN [facility] SERVICES [address] [place], [place], [address] Phone: [phone]   Fax:  [phone]  Occupational Therapy Treatment  Patient Details  Name: Danielle Lin MRN: [id number] Date of Birth: February 16, 1976 No Data Recorded  Encounter Date: 02/20/2016      OT End of Session - 02/20/16 1659    Visit Number 4   Number of Visits 36   Date for OT Re-Evaluation 05/06/16   OT Start Time 1102   OT Stop Time 1202   OT Time Calculation (min) 60 min   Activity Tolerance Patient tolerated treatment well;No increased pain   Behavior During Therapy WFL for tasks assessed/performed      Past Medical History:  Diagnosis Date  . Hypertension     Past Surgical History:  Procedure Laterality Date  . CARPAL TUNNEL RELEASE Bilateral   . CHOLECYSTECTOMY    . GASTROPLASTY DUODENAL SWITCH      There were no vitals filed for this visit.      Subjective Assessment - 02/20/16 1653    Subjective  Pt presents for OT treatment visit 3 to address moderate, Stage II, BLE/BLQ Lipo-lymphedema. Pt is unaccompanied today. Pt presents without compression wraps in place. She tells me that she did not wrap legs  over the weekend because she was feeling unwell with a cold and layed around all weekend.   Patient is accompained by: Family member   Pertinent History SIPPS sx 04/25/15 w/ weight at 306 lbs. Current weight 212.6 lbs. HTN, Asthma, OSA ( uses CPAP) B CTR   Limitations BLE chronic leg pain and swelling, L>R, difficulty walking, decreased standing tolerance, decreased activity tolerance, difficulty w/ functional mobility/ transfers   Patient Stated Goals decrease leg swelling and pain and keep it from getting worse.   Currently in Pain? Yes  no change since inititial eval. not rated numerically today.   Pain Onset More than a month ago                      OT Treatments/Exercises (OP) - 02/20/16 0001      ADLs   ADL  Education Given Yes     Manual Therapy   Manual Therapy Edema management;Manual Lymphatic Drainage (MLD);Compression Bandaging   Edema Management skin care to RLE during MLD using low ph cold pressed, skin grade castor oil.   Manual Lymphatic Drainage (MLD) Manual lymph drainage (MLD) to RLE in supine utilizing functional inguinal lymph nodes and deep abdominal lymphatics as is customary for non-cancer related lower extremity LE, including bilateral "short neck" sequence, J strokes to sub and supraclavicular LN, deep abdominal pathways, functional inguinal LN, lower extremity proximal to distal w/ emphasis on medial knee bottleneck and politeal LN. Performed fibrosis technique to B maleoli and distal  legs to address fatty fibrosis. Good tolerance.   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 Discussed progress towards goals.   Person(s) Educated Patient   Methods Explanation;Demonstration;Tactile cues;Verbal cues   Comprehension Verbalized understanding;Need further instruction  OT Long Term Goals - 02/15/16 1222      OT LONG TERM GOAL #1   Title Lymphedema (LE) management/ self-care: Pt able to apply multi layered, gradient compression wraps with min caregiver assistance using proper techniques within 2 weeks to achieve optimal limb volume reduction.   Baseline dependent for LE self care   Time 2   Period Weeks   Status New     OT LONG TERM GOAL #2   Title Lymphedema (LE) management/ self-care:  Pt to achieve at least 10% LLE limb volume reductions bilaterally during Intensive  CDT to limit LE progression, decrease infection and falls risk, to reduce pain/, and to improve safe ambulation and functional mobility.   Baseline dependent for LE self care   Time 12   Period Weeks   Status New     OT LONG TERM GOAL #3   Title Lymphedema (LE) management/ self-care:  Pt >/= 85 % compliant with all daily, LE self-care protocols for home program w/ needed level of caregiver assistance , including simple self-manual lymphatic drainage (MLD), skin care, lymphatic pumping the ex, skin care, and donning/ doffing compression wraps and garments o limit LE progression and further functional decline.     Baseline dependent for LE self care   Time 12   Period Weeks   Status New     OT LONG TERM GOAL #4   Title Lymphedema (LE) management/ self-care:  Pt to tolerate daily compression wraps, garments and devices in keeping w/ prescribed wear regime within 1 week of issue date to progress and retain clinical and functional gains and to limit LE progression.   Baseline dependent for LE self care   Time 12   Period Weeks   Status New     OT LONG TERM GOAL #5   Title Lymphedema (LE) self-care:  During Management Phase CDT Pt to sustain limb volume reductions achieved during Intensive Phase CDT within 5% utilizing LE self-care protocols, appropriate compression garments/ devices, and needed level of caregiver assistance.   Baseline dependent for LE self care   Time 12   Period Weeks   Status New               Plan - 02/20/16 1659    Clinical Impression Statement Pt becoming more accustomed to thigh length compression wraps, despite not utilizing them since last seen on Friday, 10/20. Pt reporting analgesic effect from MLD dueing Rx. Skin condition and swelling reduction appears unchanged since last visit. Cont to emphasise learning all aspects of LE self care each session to ensure optical clinical outcome and best long term self management possible to limit profgression.   Rehab  Potential Good   OT Frequency 3x / week   OT Duration 12 weeks   OT Treatment/Interventions Self-care/ADL training;Therapeutic exercise;Patient/family education;Manual Therapy;Manual lymph drainage;Other (comment);DME and/or AE instruction;Compression bandaging;Therapeutic activities  skin care      Patient will benefit from skilled therapeutic intervention in order to improve the following deficits and impairments:  Abnormal gait, Decreased skin integrity, Decreased activity tolerance, Decreased knowledge of use of DME, Impaired flexibility, Decreased balance, Decreased mobility, Difficulty walking, Obesity, Increased edema, Pain  Visit Diagnosis: Lymphedema, not elsewhere classified    Problem List There are no active problems to display for this patient.   Andrey Spearman, MS, OTR/L, [facility] 02/20/16 5:02 PM  [place] MAIN [facility] SERVICES [address] [place], [place], [address] Phone: [phone]   Fax:  [phone]  Name: Delorice Lodwick MRN: [id number] Date of Birth: 17-Jan-1976

## 2016-02-27 ENCOUNTER — Ambulatory Visit: Payer: Managed Care, Other (non HMO) | Admitting: Occupational Therapy

## 2016-02-27 DIAGNOSIS — I890 Lymphedema, not elsewhere classified: Secondary | ICD-10-CM | POA: Diagnosis not present

## 2016-02-27 NOTE — Therapy (Signed)
[place] MAIN [facility] SERVICES [address] [place], [place], [address] Phone: [phone]   Fax:  [phone]  Occupational Therapy Treatment  Patient Details  Name: Danielle Lin MRN: [id number] Date of Birth: 1975-08-23 No Data Recorded  Encounter Date: 02/27/2016      OT End of Session - 02/27/16 1211    Visit Number 5   Number of Visits 36   Date for OT Re-Evaluation 05/06/16   OT Start Time 1110   OT Stop Time 1205   OT Time Calculation (min) 55 min   Activity Tolerance Patient tolerated treatment well;No increased pain   Behavior During Therapy WFL for tasks assessed/performed      Past Medical History:  Diagnosis Date  . Hypertension     Past Surgical History:  Procedure Laterality Date  . CARPAL TUNNEL RELEASE Bilateral   . CHOLECYSTECTOMY    . GASTROPLASTY DUODENAL SWITCH      There were no vitals filed for this visit.      Subjective Assessment - 02/27/16 1205    Subjective  Pt presents for OT treatment visit 4  to address moderate, Stage II, BLE/BLQ Lipo-lymphedema. Pt is unaccompanied today. Pt presents without compression wraps in place. She tells me that she wore wraps 1 time only since last seen.   Patient is accompained by: Family member   Pertinent History SIPPS sx 04/25/15 w/ weight at 306 lbs. Current weight 212.6 lbs. HTN, Asthma, OSA ( uses CPAP) B CTR   Limitations BLE chronic leg pain and swelling, L>R, difficulty walking, decreased standing tolerance, decreased activity tolerance, difficulty w/ functional mobility/ transfers   Patient Stated Goals decrease leg swelling and pain and keep it from getting worse.   Currently in Pain? Yes  not rated numerically- no change from eval   Pain Location Leg   Pain Orientation Right;Left   Pain Descriptors / Indicators Aching   Pain Type Chronic pain   Pain Onset More than a month ago                      OT Treatments/Exercises (OP) -  02/27/16 0001      ADLs   ADL Education Given Yes     Manual Therapy   Manual Therapy Edema management;Manual Lymphatic Drainage (MLD);Compression Bandaging   Edema Management skin care to RLE during MLD using low ph cold pressed, skin grade castor oil.   Manual Lymphatic Drainage (MLD) Manual lymph drainage (MLD) to RLE in supine utilizing functional inguinal lymph nodes and deep abdominal lymphatics as is customary for non-cancer related lower extremity LE, including bilateral "short neck" sequence, J strokes to sub and supraclavicular LN, deep abdominal pathways, functional inguinal LN, lower extremity proximal to distal w/ emphasis on medial knee bottleneck and politeal LN. Performed fibrosis technique to B maleoli and distal  legs to address fatty fibrosis. Good tolerance.                OT Education - 02/27/16 1208    Education provided Yes   Education Details Emphasis of LE self-care training today on selection and fitting process of appropriate compression garments and devices, including including rational, options, and specification ecommendations in this case.             OT Long Term Goals - 02/15/16 1222      OT LONG TERM GOAL #1   Title Lymphedema (LE) management/ self-care: Pt able to apply multi layered, gradient compression wraps  with min caregiver assistance using proper techniques within 2 weeks to achieve optimal limb volume reduction.   Baseline dependent for LE self care   Time 2   Period Weeks   Status New     OT LONG TERM GOAL #2   Title Lymphedema (LE) management/ self-care:  Pt to achieve at least 10% LLE limb volume reductions bilaterally during Intensive CDT to limit LE progression, decrease infection and falls risk, to reduce pain/, and to improve safe ambulation and functional mobility.   Baseline dependent for LE self care   Time 12   Period Weeks   Status New     OT LONG TERM GOAL #3   Title Lymphedema (LE) management/ self-care:  Pt >/=  85 % compliant with all daily, LE self-care protocols for home program w/ needed level of caregiver assistance , including simple self-manual lymphatic drainage (MLD), skin care, lymphatic pumping the ex, skin care, and donning/ doffing compression wraps and garments o limit LE progression and further functional decline.     Baseline dependent for LE self care   Time 12   Period Weeks   Status New     OT LONG TERM GOAL #4   Title Lymphedema (LE) management/ self-care:  Pt to tolerate daily compression wraps, garments and devices in keeping w/ prescribed wear regime within 1 week of issue date to progress and retain clinical and functional gains and to limit LE progression.   Baseline dependent for LE self care   Time 12   Period Weeks   Status New     OT LONG TERM GOAL #5   Title Lymphedema (LE) self-care:  During Management Phase CDT Pt to sustain limb volume reductions achieved during Intensive Phase CDT within 5% utilizing LE self-care protocols, appropriate compression garments/ devices, and needed level of caregiver assistance.   Baseline dependent for LE self care   Time 12   Period Weeks   Status New               Plan - 02/27/16 1212    Clinical Impression Statement Pt not utilizing compression wraps during visit intervals as directed. Pt has difficulty wrapping herself, and she explains that her spouse, who assists her, worked away from home all weekend. Pt continues to c/o mild leg pain intermittently- essentially unchanged since initial eval. No visible reduction in leg swelling or palpable change in tissue density observed today. Discussed garment options and recommendations throughout MLD today. Pt agrees w/ plan to get "ball park estimate" from DME vendor.   Rehab Potential Good   OT Frequency 3x / week   OT Duration 12 weeks   OT Treatment/Interventions Self-care/ADL training;Therapeutic exercise;Patient/family education;Manual Therapy;Manual lymph drainage;Other  (comment);DME and/or AE instruction;Compression bandaging;Therapeutic activities  skin care      Patient will benefit from skilled therapeutic intervention in order to improve the following deficits and impairments:  Abnormal gait, Decreased skin integrity, Decreased activity tolerance, Decreased knowledge of use of DME, Impaired flexibility, Decreased balance, Decreased mobility, Difficulty walking, Obesity, Increased edema, Pain  Visit Diagnosis: Lymphedema, not elsewhere classified    Problem List There are no active problems to display for this patient.   Andrey Spearman, MS, OTR/L, [facility] 02/27/16 12:16 PM  [place] MAIN [facility] SERVICES [address] [place], [place], [address] Phone: [phone]   Fax:  [phone]  Name: Danielle Lin MRN: [id number] Date of Birth: [birth year]

## 2016-02-27 NOTE — Patient Instructions (Signed)
LE instructions and precautions as established- see initial eval.   

## 2016-02-29 ENCOUNTER — Ambulatory Visit: Payer: Managed Care, Other (non HMO) | Attending: Physician Assistant | Admitting: Occupational Therapy

## 2016-02-29 ENCOUNTER — Encounter: Payer: Managed Care, Other (non HMO) | Admitting: Occupational Therapy

## 2016-02-29 DIAGNOSIS — I890 Lymphedema, not elsewhere classified: Secondary | ICD-10-CM | POA: Diagnosis present

## 2016-02-29 NOTE — Patient Instructions (Signed)
LE instructions and precautions as established- see initial eval.   

## 2016-02-29 NOTE — Therapy (Signed)
[place] MAIN [facility] SERVICES [address] [place], [place], [address] Phone: [phone]   Fax:  [phone]  Occupational Therapy Treatment  Patient Details  Name: Danielle Lin MRN: [id number] Date of Birth: 1975/08/03 No Data Recorded  Encounter Date: 02/29/2016      OT End of Session - 02/29/16 1233    Visit Number 6   Number of Visits 36   Date for OT Re-Evaluation 05/06/16   OT Start Time 0804   OT Stop Time 0906   OT Time Calculation (min) 62 min   Activity Tolerance Patient tolerated treatment well;No increased pain   Behavior During Therapy WFL for tasks assessed/performed      Past Medical History:  Diagnosis Date  . Hypertension     Past Surgical History:  Procedure Laterality Date  . CARPAL TUNNEL RELEASE Bilateral   . CHOLECYSTECTOMY    . GASTROPLASTY DUODENAL SWITCH      There were no vitals filed for this visit.      Subjective Assessment - 02/29/16 1228    Subjective  Pt presents for OT treatment visit 5 to address moderate, Stage II, BLE/BLQ Lipo-lymphedema. Pt is unaccompanied today. Pt presents without compression wraps in place. Pt states she feels like CDT is helping her.   Patient is accompained by: Family member   Pertinent History SIPPS sx 04/25/15 w/ weight at 306 lbs. Current weight 212.6 lbs. HTN, Asthma, OSA ( uses CPAP) B CTR   Limitations BLE chronic leg pain and swelling, L>R, difficulty walking, decreased standing tolerance, decreased activity tolerance, difficulty w/ functional mobility/ transfers   Patient Stated Goals decrease leg swelling and pain and keep it from getting worse.   Currently in Pain? No/denies   Pain Onset More than a month ago                      OT Treatments/Exercises (OP) - 02/29/16 0001      ADLs   ADL Education Given Yes     Manual Therapy   Manual Therapy Edema management;Manual Lymphatic Drainage (MLD);Compression Bandaging   Edema  Management skin care to RLE during MLD using low ph cold pressed, skin grade castor oil.   Manual Lymphatic Drainage (MLD) Manual lymph drainage (MLD) to RLE in supine utilizing functional inguinal lymph nodes and deep abdominal lymphatics as is customary for non-cancer related lower extremity LE, including bilateral "short neck" sequence, J strokes to sub and supraclavicular LN, deep abdominal pathways, functional inguinal LN, lower extremity proximal to distal w/ emphasis on medial knee bottleneck and politeal LN. Performed fibrosis technique to B maleoli and distal  legs to address fatty fibrosis. Good tolerance.   Compression Bandaging RLE gradient compression wraps applied from base of toes to below knee as follows:                OT Education - 02/29/16 1232    Education provided Yes   Education Details Emphasis of skilled self care training on simple-self MLD today. Trained Pt on rational, anatomy and physiology, and BLE non-cancer related MLD.   Person(s) Educated Patient   Methods Explanation;Demonstration;Tactile cues;Verbal cues;Handout             OT Long Term Goals - 02/15/16 1222      OT LONG TERM GOAL #1   Title Lymphedema (LE) management/ self-care: Pt able to apply multi layered, gradient compression wraps with min caregiver assistance using proper techniques within 2 weeks to achieve  optimal limb volume reduction.   Baseline dependent for LE self care   Time 2   Period Weeks   Status New     OT LONG TERM GOAL #2   Title Lymphedema (LE) management/ self-care:  Pt to achieve at least 10% LLE limb volume reductions bilaterally during Intensive CDT to limit LE progression, decrease infection and falls risk, to reduce pain/, and to improve safe ambulation and functional mobility.   Baseline dependent for LE self care   Time 12   Period Weeks   Status New     OT LONG TERM GOAL #3   Title Lymphedema (LE) management/ self-care:  Pt >/= 85 % compliant with all  daily, LE self-care protocols for home program w/ needed level of caregiver assistance , including simple self-manual lymphatic drainage (MLD), skin care, lymphatic pumping the ex, skin care, and donning/ doffing compression wraps and garments o limit LE progression and further functional decline.     Baseline dependent for LE self care   Time 12   Period Weeks   Status New     OT LONG TERM GOAL #4   Title Lymphedema (LE) management/ self-care:  Pt to tolerate daily compression wraps, garments and devices in keeping w/ prescribed wear regime within 1 week of issue date to progress and retain clinical and functional gains and to limit LE progression.   Baseline dependent for LE self care   Time 12   Period Weeks   Status New     OT LONG TERM GOAL #5   Title Lymphedema (LE) self-care:  During Management Phase CDT Pt to sustain limb volume reductions achieved during Intensive Phase CDT within 5% utilizing LE self-care protocols, appropriate compression garments/ devices, and needed level of caregiver assistance.   Baseline dependent for LE self care   Time 12   Period Weeks   Status New               Plan - 02/29/16 1620    Clinical Impression Statement Pt reports she wrapped one time during OT Rx interval. We reviewed importance of 23/7 compression to achieve optimal limb volume reduction, but this is difficult for her to do on her own as her spouse is not always available to assist. Pt tolerated MLD well today without difficulty. Limb volume at distal   leg appears to be slightly decreased in volume by visual assessment. Congestion in R lower leg is definitely decreased by palpation. Pt interested in obtaining compression garments by end of the year. OT to email vendor to request assistance in this case due to complexity of    measurements for lipo-lymphedema containment.   Rehab Potential Good   OT Frequency 3x / week   OT Duration 12 weeks   OT Treatment/Interventions Self-care/ADL  training;Therapeutic exercise;Patient/family education;Manual Therapy;Manual lymph drainage;Other (comment);DME and/or AE instruction;Compression bandaging;Therapeutic activities  skin care      Patient will benefit from skilled therapeutic intervention in order to improve the following deficits and impairments:  Abnormal gait, Decreased skin integrity, Decreased activity tolerance, Decreased knowledge of use of DME, Impaired flexibility, Decreased balance, Decreased mobility, Difficulty walking, Obesity, Increased edema, Pain  Visit Diagnosis: Lymphedema, not elsewhere classified    Problem List There are no active problems to display for this patient.   Andrey Spearman, MS, OTR/L, [facility] 02/29/16 4:25 PM   [place] MAIN [facility] SERVICES [address] [place], [place], [address] Phone: [phone]   Fax:  [phone]  Name: Danielle Lin MRN: [id number] Date of Birth: [birth year]

## 2016-03-02 ENCOUNTER — Ambulatory Visit: Payer: Managed Care, Other (non HMO) | Admitting: Occupational Therapy

## 2016-03-02 DIAGNOSIS — I890 Lymphedema, not elsewhere classified: Secondary | ICD-10-CM

## 2016-03-02 NOTE — Patient Instructions (Signed)
LE instructions and precautions as established- see initial eval.   

## 2016-03-02 NOTE — Therapy (Signed)
[place] MAIN [facility] SERVICES [address] [place], [place], [address] Phone: [phone]   Fax:  [phone]  Occupational Therapy Treatment  Patient Details  Name: Danielle Lin MRN: [id number] Date of Birth: 1975-05-20 No Data Recorded  Encounter Date: 03/02/2016      OT End of Session - 03/02/16 1508    Visit Number 7   Number of Visits 36   Date for OT Re-Evaluation 05/06/16   OT Start Time 1106   OT Stop Time 1216   OT Time Calculation (min) 70 min   Activity Tolerance Patient tolerated treatment well;No increased pain   Behavior During Therapy WFL for tasks assessed/performed      Past Medical History:  Diagnosis Date  . Hypertension     Past Surgical History:  Procedure Laterality Date  . CARPAL TUNNEL RELEASE Bilateral   . CHOLECYSTECTOMY    . GASTROPLASTY DUODENAL SWITCH      There were no vitals filed for this visit.      Subjective Assessment - 03/02/16 1402    Subjective  Pt presents for OT treatment visit 6 to address moderate, Stage II, BLE/BLQ Lipo-lymphedema. Pt is unaccompanied today. Pt presents without compression wraps in place. Pt has no new complaints. She is planning to explore liposuction. She is pleased w/ volumetric reduction achieved in thery so far.   Patient is accompained by: Family member   Pertinent History SIPPS sx 04/25/15 w/ weight at 306 lbs. Current weight 212.6 lbs. HTN, Asthma, OSA ( uses CPAP) B CTR   Limitations BLE chronic leg pain and swelling, L>R, difficulty walking, decreased standing tolerance, decreased activity tolerance, difficulty w/ functional mobility/ transfers   Patient Stated Goals decrease leg swelling and pain and keep it from getting worse.   Currently in Pain? No/denies   Pain Onset More than a month ago             LYMPHEDEMA/ONCOLOGY QUESTIONNAIRE - 03/02/16 1404      Right Lower Extremity Lymphedema   Other RLE A-D ( ankle tobelow knee) limb volume=  8675.60 ml. A-D Limb volume is decreased by 4.05% since last measured on 02/13/16   Other RLE A-G ( ankle to groin) limb volume= 15354.27 ml. A-D Limb volume is decreased by 3.91% since last measured on 02/13/16                 OT Treatments/Exercises (OP) - 03/02/16 0001      ADLs   ADL Education Given Yes     Manual Therapy   Manual Therapy Edema management;Manual Lymphatic Drainage (MLD);Compression Bandaging   Edema Management skin care to RLE during MLD using low ph cold pressed, skin grade castor oil.   Manual Lymphatic Drainage (MLD) Manual lymph drainage (MLD) to RLE in supine utilizing functional inguinal lymph nodes and deep abdominal lymphatics as is customary for non-cancer related lower extremity LE, including bilateral "short neck" sequence, J strokes to sub and supraclavicular LN, deep abdominal pathways, functional inguinal LN, lower extremity proximal to distal w/ emphasis on medial knee bottleneck and politeal LN. Performed fibrosis technique to B maleoli and distal  legs to address fatty fibrosis. Good tolerance.   Compression Bandaging RLE gradient compression wraps applied from base of toes to below knee as follows:                OT Education - 03/02/16 1401    Education provided Yes   Education Details Con tinued Sports administrator  And LE ADL training throughout visit for lymphedema self care, including compression wrapping, compression garment and device wear/care, lymphatic pumping ther ex, simple self-MLD, and skin care. Discussed progress towards goals.   Person(s) Educated Patient   Methods Explanation;Demonstration;Handout   Comprehension Verbalized understanding;Need further instruction             OT Long Term Goals - 02/15/16 1222      OT LONG TERM GOAL #1   Title Lymphedema (LE) management/ self-care: Pt able to apply multi layered, gradient compression wraps with min caregiver assistance using proper techniques within  2 weeks to achieve optimal limb volume reduction.   Baseline dependent for LE self care   Time 2   Period Weeks   Status New     OT LONG TERM GOAL #2   Title Lymphedema (LE) management/ self-care:  Pt to achieve at least 10% LLE limb volume reductions bilaterally during Intensive CDT to limit LE progression, decrease infection and falls risk, to reduce pain/, and to improve safe ambulation and functional mobility.   Baseline dependent for LE self care   Time 12   Period Weeks   Status New     OT LONG TERM GOAL #3   Title Lymphedema (LE) management/ self-care:  Pt >/= 85 % compliant with all daily, LE self-care protocols for home program w/ needed level of caregiver assistance , including simple self-manual lymphatic drainage (MLD), skin care, lymphatic pumping the ex, skin care, and donning/ doffing compression wraps and garments o limit LE progression and further functional decline.     Baseline dependent for LE self care   Time 12   Period Weeks   Status New     OT LONG TERM GOAL #4   Title Lymphedema (LE) management/ self-care:  Pt to tolerate daily compression wraps, garments and devices in keeping w/ prescribed wear regime within 1 week of issue date to progress and retain clinical and functional gains and to limit LE progression.   Baseline dependent for LE self care   Time 12   Period Weeks   Status New     OT LONG TERM GOAL #5   Title Lymphedema (LE) self-care:  During Management Phase CDT Pt to sustain limb volume reductions achieved during Intensive Phase CDT within 5% utilizing LE self-care protocols, appropriate compression garments/ devices, and needed level of caregiver assistance.   Baseline dependent for LE self care   Time 12   Period Weeks   Status New               Plan - 03/02/16 1509    Clinical Impression Statement RLE comparative limb volumetrics reveals A-D ( ankle to below knee) limb volume is decreased by 4.05% since initially measured on  02-13-2016, and A-G volume (ankle to groin) is decreased overall by 3.91%. Both A-D and knee to groin secrtions are decreased, which indicated lymphatic movement out of leg into inguinal           pathways.Pt is increasing tolerance to compression wraps. She is learning simple self-MLD on par with goal expectation. Cont as per POC.   Rehab Potential Good   OT Frequency 3x / week   OT Duration 12 weeks   OT Treatment/Interventions Self-care/ADL training;Therapeutic exercise;Patient/family education;Manual Therapy;Manual lymph drainage;Other (comment);DME and/or AE instruction;Compression bandaging;Therapeutic activities  skin care      Patient will benefit from skilled therapeutic intervention in order to improve the following deficits and impairments:  Abnormal gait, Decreased skin integrity, Decreased  activity tolerance, Decreased knowledge of use of DME, Impaired flexibility, Decreased balance, Decreased mobility, Difficulty walking, Obesity, Increased edema, Pain  Visit Diagnosis: Lymphedema, not elsewhere classified    Problem List There are no active problems to display for this patient.  Andrey Spearman, MS, OTR/L, [facility] 03/02/16 3:14 PM   [place] MAIN [facility] SERVICES [address] [place], [place], [address] Phone: [phone]   Fax:  [phone]  Name: Danielle Lin MRN: [id number] Date of Birth: 25-Jan-1976

## 2016-03-05 ENCOUNTER — Ambulatory Visit: Payer: Managed Care, Other (non HMO) | Admitting: Occupational Therapy

## 2016-03-05 DIAGNOSIS — I890 Lymphedema, not elsewhere classified: Secondary | ICD-10-CM

## 2016-03-05 NOTE — Patient Instructions (Signed)
LE instructions and precautions as established- see initial eval.   

## 2016-03-05 NOTE — Therapy (Signed)
[place] MAIN [facility] SERVICES [address] [place], [place], [address] Phone: [phone]   Fax:  [phone]  Occupational Therapy Treatment  Patient Details  Name: Danielle Lin MRN: [id number] Date of Birth: 11/05/1975 No Data Recorded  Encounter Date: 03/05/2016      OT End of Session - 03/05/16 1229    Visit Number 8   Number of Visits 36   Date for OT Re-Evaluation 05/06/16   OT Start Time 0805   OT Stop Time 0903   OT Time Calculation (min) 58 min   Activity Tolerance Patient tolerated treatment well;No increased pain   Behavior During Therapy WFL for tasks assessed/performed      Past Medical History:  Diagnosis Date  . Hypertension     Past Surgical History:  Procedure Laterality Date  . CARPAL TUNNEL RELEASE Bilateral   . CHOLECYSTECTOMY    . GASTROPLASTY DUODENAL SWITCH      There were no vitals filed for this visit.      Subjective Assessment - 03/05/16 1226    Subjective  Pt presents for OT treatment visit 7 to address moderate, Stage II, BLE/BLQ Lipo-lymphedema. Pt is unaccompanied today. Pt tells me she didnt use RLE compression wraps over the weekend because her L leg hurt.   Patient is accompained by: Family member   Pertinent History SIPPS sx 04/25/15 w/ weight at 306 lbs. Current weight 212.6 lbs. HTN, Asthma, OSA ( uses CPAP) B CTR   Limitations BLE chronic leg pain and swelling, L>R, difficulty walking, decreased standing tolerance, decreased activity tolerance, difficulty w/ functional mobility/ transfers   Patient Stated Goals decrease leg swelling and pain and keep it from getting worse.   Currently in Pain? No/denies   Pain Onset More than a month ago                      OT Treatments/Exercises (OP) - 03/05/16 0001      ADLs   ADL Education Given Yes     Manual Therapy   Manual Therapy Edema management;Manual Lymphatic Drainage (MLD);Compression Bandaging   Edema Management  skin care to RLE during MLD using low ph cold pressed, skin grade castor oil.   Manual Lymphatic Drainage (MLD) Manual lymph drainage (MLD) to RLE in supine utilizing functional inguinal lymph nodes and deep abdominal lymphatics as is customary for non-cancer related lower extremity LE, including bilateral "short neck" sequence, J strokes to sub and supraclavicular LN, deep abdominal pathways, functional inguinal LN, lower extremity proximal to distal w/ emphasis on medial knee bottleneck and politeal LN. Performed fibrosis technique to B maleoli and distal  legs to address fatty fibrosis. Good tolerance.   Compression Bandaging RLE gradient compression wraps applied from base of toes to below knee as follows:                OT Education - 03/05/16 1228    Education provided Yes   Education Details Con tinued skilled Pt/caregiver Education  And LE ADL training throughout visit for lymphedema self care, including compression wrapping, compression garment and device wear/care, lymphatic pumping ther ex, simple self-MLD, and skin care. Discussed progress towards goals.   Person(s) Educated Patient   Methods Explanation   Comprehension Verbalized understanding             OT Long Term Goals - 02/15/16 1222      OT LONG TERM GOAL #1   Title Lymphedema (LE) management/ self-care: Pt able  to apply multi layered, gradient compression wraps with min caregiver assistance using proper techniques within 2 weeks to achieve optimal limb volume reduction.   Baseline dependent for LE self care   Time 2   Period Weeks   Status New     OT LONG TERM GOAL #2   Title Lymphedema (LE) management/ self-care:  Pt to achieve at least 10% LLE limb volume reductions bilaterally during Intensive CDT to limit LE progression, decrease infection and falls risk, to reduce pain/, and to improve safe ambulation and functional mobility.   Baseline dependent for LE self care   Time 12   Period Weeks   Status  New     OT LONG TERM GOAL #3   Title Lymphedema (LE) management/ self-care:  Pt >/= 85 % compliant with all daily, LE self-care protocols for home program w/ needed level of caregiver assistance , including simple self-manual lymphatic drainage (MLD), skin care, lymphatic pumping the ex, skin care, and donning/ doffing compression wraps and garments o limit LE progression and further functional decline.     Baseline dependent for LE self care   Time 12   Period Weeks   Status New     OT LONG TERM GOAL #4   Title Lymphedema (LE) management/ self-care:  Pt to tolerate daily compression wraps, garments and devices in keeping w/ prescribed wear regime within 1 week of issue date to progress and retain clinical and functional gains and to limit LE progression.   Baseline dependent for LE self care   Time 12   Period Weeks   Status New     OT LONG TERM GOAL #5   Title Lymphedema (LE) self-care:  During Management Phase CDT Pt to sustain limb volume reductions achieved during Intensive Phase CDT within 5% utilizing LE self-care protocols, appropriate compression garments/ devices, and needed level of caregiver assistance.   Baseline dependent for LE self care   Time 12   Period Weeks   Status New               Plan - 03/05/16 1229    Clinical Impression Statement Pt has not  habituated compression wrapping to date, so dramatic limb volume reduction is not expected in this case if this pattern continues. Pt will benefit from fitting w/ compression garments ASAP instead. OT working on finding in-network DME vedor who can measure for and fit compression garments by the end of the year so Pt is able to access her insurance benefits. Regular DME vendor is not in network w/ [employer].   Rehab Potential Good   OT Frequency 3x / week   OT Duration 12 weeks   OT Treatment/Interventions Self-care/ADL training;Therapeutic exercise;Patient/family education;Manual Therapy;Manual lymph drainage;Other  (comment);DME and/or AE instruction;Compression bandaging;Therapeutic activities  skin care      Patient will benefit from skilled therapeutic intervention in order to improve the following deficits and impairments:  Decreased skin integrity, Decreased activity tolerance, Decreased knowledge of use of DME, Impaired flexibility, Decreased balance, Decreased mobility, Difficulty walking, Obesity, Increased edema, Pain  Visit Diagnosis: Lymphedema, not elsewhere classified    Problem List There are no active problems to display for this patient.   Andrey Spearman, MS, OTR/L, [facility] 03/05/16 12:34 PM  [place] MAIN [facility] SERVICES [address] [place], [place], [address] Phone: [phone]   Fax:  [phone]  Name: Danielle Lin MRN: [id number] Date of Birth: [birth year]

## 2016-03-07 ENCOUNTER — Ambulatory Visit: Payer: Managed Care, Other (non HMO) | Admitting: Occupational Therapy

## 2016-03-07 DIAGNOSIS — I890 Lymphedema, not elsewhere classified: Secondary | ICD-10-CM | POA: Diagnosis not present

## 2016-03-07 NOTE — Therapy (Signed)
[facility] SERVICES [address] [place], [place], [address] Phone: [phone]   Fax:  [phone]  Occupational Therapy Treatment  Patient Details  Name: Danielle Lin MRN: [id number] Date of Birth: 1975-08-20 No Data Recorded  Encounter Date: 03/07/2016      OT End of Session - 03/07/16 1109    Visit Number 9   Number of Visits 36   Date for OT Re-Evaluation 05/06/16   OT Start Time 0804   OT Stop Time [id number]   OT Time Calculation (min) 53 min   Activity Tolerance Patient tolerated treatment well;No increased pain   Behavior During Therapy WFL for tasks assessed/performed      Past Medical History:  Diagnosis Date  . Hypertension     Past Surgical History:  Procedure Laterality Date  . CARPAL TUNNEL RELEASE Bilateral   . CHOLECYSTECTOMY    . GASTROPLASTY DUODENAL SWITCH      There were no vitals filed for this visit.      Subjective Assessment - 03/07/16 1106    Subjective  Pt presents without compression wraps in place as directed for OT treatment visit 9 to address moderate, Stage II, BLE/BLQ Lipo-lymphedema. Pt is unaccompanied today. Pt and OT discuss garment measring and fitting process throughout session so Pt has realistic expectations and understands remake process to refine fit for optimal outcome.   Patient is accompained by: Family member   Pertinent History SIPPS sx 04/25/15 w/ weight at 306 lbs. Current weight 212.6 lbs. HTN, Asthma, OSA ( uses CPAP) B CTR   Limitations BLE chronic leg pain and swelling, L>R, difficulty walking, decreased standing tolerance, decreased activity tolerance, difficulty w/ functional mobility/ transfers   Patient Stated Goals decrease leg swelling and pain and keep it from getting worse.   Currently in Pain? No/denies   Pain Onset More than a month ago                              OT Education - 03/07/16 1109    Education provided Yes   Education  Details Con tinued Sports administrator  And LE ADL training throughout visit for lymphedema self care, including compression wrapping, compression garment and device wear/care, lymphatic pumping ther ex, simple self-MLD, and skin care. Discussed progress towards goals.   Person(s) Educated Patient   Methods Explanation   Comprehension Verbalized understanding             OT Long Term Goals - 02/15/16 1222      OT LONG TERM GOAL #1   Title Lymphedema (LE) management/ self-care: Pt able to apply multi layered, gradient compression wraps with min caregiver assistance using proper techniques within 2 weeks to achieve optimal limb volume reduction.   Baseline dependent for LE self care   Time 2   Period Weeks   Status New     OT LONG TERM GOAL #2   Title Lymphedema (LE) management/ self-care:  Pt to achieve at least 10% LLE limb volume reductions bilaterally during Intensive CDT to limit LE progression, decrease infection and falls risk, to reduce pain/, and to improve safe ambulation and functional mobility.   Baseline dependent for LE self care   Time 12   Period Weeks   Status New     OT LONG TERM GOAL #3   Title Lymphedema (LE) management/ self-care:  Pt >/= 85 % compliant with all daily, LE self-care protocols  for home program w/ needed level of caregiver assistance , including simple self-manual lymphatic drainage (MLD), skin care, lymphatic pumping the ex, skin care, and donning/ doffing compression wraps and garments o limit LE progression and further functional decline.     Baseline dependent for LE self care   Time 12   Period Weeks   Status New     OT LONG TERM GOAL #4   Title Lymphedema (LE) management/ self-care:  Pt to tolerate daily compression wraps, garments and devices in keeping w/ prescribed wear regime within 1 week of issue date to progress and retain clinical and functional gains and to limit LE progression.   Baseline dependent for LE self care    Time 12   Period Weeks   Status New     OT LONG TERM GOAL #5   Title Lymphedema (LE) self-care:  During Management Phase CDT Pt to sustain limb volume reductions achieved during Intensive Phase CDT within 5% utilizing LE self-care protocols, appropriate compression garments/ devices, and needed level of caregiver assistance.   Baseline dependent for LE self care   Time 12   Period Weeks   Status New               Plan - 03/07/16 1343    Clinical Impression Statement RLE limb volume below the knee continues to decrease slowly by visual assessment. Skin fold at R ankle is decreased and distal swelling is obviously decreased since commencing Rx. Tissue density at these landmarks is alsy decreased by palpation. Whjen last measured limb volume above the knee was also decreased, but this is a more subtle change and difficult to see and feel at this juncture. Pt tolerated MLD without difficulty. Continues to have intermittent pain in legs 2/2 lipo-lymphedema constellation of symptoms, and Pt continues to have difficulty with LE self care compliance during visit intervals.    Rehab Potential Good   OT Frequency 3x / week   OT Duration 12 weeks   OT Treatment/Interventions Self-care/ADL training;Therapeutic exercise;Patient/family education;Manual Therapy;Manual lymph drainage;Other (comment);DME and/or AE instruction;Compression bandaging;Therapeutic activities  skin care      Patient will benefit from skilled therapeutic intervention in order to improve the following deficits and impairments:  Decreased skin integrity, Decreased activity tolerance, Decreased knowledge of use of DME, Impaired flexibility, Decreased balance, Decreased mobility, Difficulty walking, Obesity, Increased edema, Pain  Visit Diagnosis: Lymphedema, not elsewhere classified    Problem List There are no active problems to display for this patient.   Andrey Spearman, MS, OTR/L, [facility] 03/07/16 1:48 PM  [place] MAIN [facility] SERVICES [address] [place], [place], [address] Phone: [phone]   Fax:  [phone]  Name: Danielle Lin MRN: [id number] Date of Birth: 1976/03/30

## 2016-03-07 NOTE — Patient Instructions (Signed)
LE instructions and precautions as established- see initial eval.   

## 2016-03-09 ENCOUNTER — Ambulatory Visit: Payer: Managed Care, Other (non HMO) | Admitting: Occupational Therapy

## 2016-03-09 DIAGNOSIS — I890 Lymphedema, not elsewhere classified: Secondary | ICD-10-CM | POA: Diagnosis not present

## 2016-03-09 NOTE — Therapy (Signed)
[place] MAIN [facility] SERVICES [address] [place], [place], [address] Phone: [phone]   Fax:  [phone]  Occupational Therapy Treatment & Progress Note  Patient Details  Name: Danielle Lin MRN: [id number] Date of Birth: [birth year] No Data Recorded  Encounter Date: 03/09/2016      OT End of Session - 03/09/16 0811    Visit Number 10   Number of Visits 36   Date for OT Re-Evaluation 05/06/16   OT Start Time 0800   OT Stop Time 0908   OT Time Calculation (min) 68 min   Activity Tolerance Patient tolerated treatment well;No increased pain   Behavior During Therapy WFL for tasks assessed/performed      Past Medical History:  Diagnosis Date  . Hypertension     Past Surgical History:  Procedure Laterality Date  . CARPAL TUNNEL RELEASE Bilateral   . CHOLECYSTECTOMY    . GASTROPLASTY DUODENAL SWITCH      There were no vitals filed for this visit.      Subjective Assessment - 03/09/16 1036    Subjective  Pt presents without compression wraps in place as directed for OT treatment visit 10 to address moderate, Stage II, BLE/BLQ Lipo-lymphedema. Pt is unaccompanied today. No new complaints.   Patient is accompained by: Family member   Pertinent History SIPPS sx 04/25/15 w/ weight at 306 lbs. Current weight 212.6 lbs. HTN, Asthma, OSA ( uses CPAP) B CTR   Limitations BLE chronic leg pain and swelling, L>R, difficulty walking, decreased standing tolerance, decreased activity tolerance, difficulty w/ functional mobility/ transfers   Patient Stated Goals decrease leg swelling and pain and keep it from getting worse.   Currently in Pain? No/denies   Pain Onset More than a month ago                      OT Treatments/Exercises (OP) - 03/09/16 0001      ADLs   ADL Education Given Yes     Manual Therapy   Manual Therapy Edema management;Manual Lymphatic Drainage (MLD);Compression Bandaging   Edema Management skin  care to RLE during MLD using low ph cold pressed, skin grade castor oil.   Manual Lymphatic Drainage (MLD) Manual lymph drainage (MLD) to RLE in supine utilizing functional inguinal lymph nodes and deep abdominal lymphatics as is customary for non-cancer related lower extremity LE, including bilateral "short neck" sequence, J strokes to sub and supraclavicular LN, deep abdominal pathways, functional inguinal LN, lower extremity proximal to distal w/ emphasis on medial knee bottleneck and politeal LN. Performed fibrosis technique to B maleoli and distal  legs to address fatty fibrosis. Good tolerance.   Compression Bandaging RLE gradient compression wraps applied from base of toes to below knee as follows:                OT Education - 03/09/16 1038    Education provided Yes   Education Details Emphasis of Pt education/ LE  self care training today on clinical benefits and possible contraindications for sequential pneumatic compression pumps.  Discussed recommendations for a Flexitouch system as  appropriate and effective adjunct to successful LE self management over time. Educated Pt on how Flexi stimulates the lymphatic system proximally to distal to remove edema in a similar way to MLD. Pt verbalized understanding of benefits, precautions and contraindications, and care and use routines.    Person(s) Educated Patient   Methods Explanation;Handout   Comprehension Verbalized understanding;Need further instruction  OT Long Term Goals - 03/09/16 1051      OT LONG TERM GOAL #1   Title Lymphedema (LE) management/ self-care: Pt able to apply multi layered, gradient compression wraps with min caregiver assistance using proper techniques within 2 weeks to achieve optimal limb volume reduction.   Baseline dependent for LE self care   Time 2   Period Weeks   Status Not Met     OT LONG TERM GOAL #2   Title Lymphedema (LE) management/ self-care:  Pt to achieve at least 10% LLE  limb volume reductions bilaterally during Intensive CDT to limit LE progression, decrease infection and falls risk, to reduce pain/, and to improve safe ambulation and functional mobility.   Baseline dependent for LE self care   Time 12   Period Weeks   Status Partially Met     OT LONG TERM GOAL #3   Title Lymphedema (LE) management/ self-care:  Pt >/= 85 % compliant with all daily, LE self-care protocols for home program w/ needed level of caregiver assistance , including simple self-manual lymphatic drainage (MLD), skin care, lymphatic pumping the ex, skin care, and donning/ doffing compression wraps and garments o limit LE progression and further functional decline.     Baseline dependent for LE self care   Time 12   Period Weeks   Status Not Met     OT LONG TERM GOAL #4   Title Lymphedema (LE) management/ self-care:  Pt to tolerate daily compression wraps, garments and devices in keeping w/ prescribed wear regime within 1 week of issue date to progress and retain clinical and functional gains and to limit LE progression.   Baseline dependent for LE self care   Time 12   Period Weeks   Status Partially Met     OT LONG TERM GOAL #5   Title Lymphedema (LE) self-care:  During Management Phase CDT Pt to sustain limb volume reductions achieved during Intensive Phase CDT within 5% utilizing LE self-care protocols, appropriate compression garments/ devices, and needed level of caregiver assistance.   Baseline dependent for LE self care   Time 12   Period Weeks   Status On-going               Plan - 03/09/16 1047    Clinical Impression Statement RLE/ RLQ continues to respond to CDT. Limb volume is decreasing slowly, which is expected as Lipo-lymphedema is typically slow to respond. Pt relying primarily on OT to wrap after Rx sessions and she is not wrapping herself at home. Skin is well cared for and she is learning MLD sequences. We  will  do a trial w/ Flexitouch system on Monday  and  continue to explore garments when we are able to locaTE A VENDOR IN nETWORK W/ [employer].   Rehab Potential Good   OT Frequency 3x / week   OT Duration 12 weeks   OT Treatment/Interventions Self-care/ADL training;Therapeutic exercise;Patient/family education;Manual Therapy;Manual lymph drainage;Other (comment);DME and/or AE instruction;Compression bandaging;Therapeutic activities  skin care      Patient will benefit from skilled therapeutic intervention in order to improve the following deficits and impairments:  Decreased skin integrity, Decreased activity tolerance, Decreased knowledge of use of DME, Impaired flexibility, Decreased balance, Decreased mobility, Difficulty walking, Obesity, Increased edema, Pain  Visit Diagnosis: Lymphedema, not elsewhere classified    Problem List There are no active problems to display for this patient.   Andrey Spearman, MS, OTR/L, [facility] 03/09/16 10:53 AM  [place]  [place], [place], [address] Phone: [phone]   Fax:  [phone]  Name: Danielle Lin MRN: [id number] Date of Birth: [birth year]

## 2016-03-09 NOTE — Patient Instructions (Signed)
LE instructions and precautions as established- see initial eval.   

## 2016-03-12 ENCOUNTER — Ambulatory Visit: Payer: Managed Care, Other (non HMO) | Admitting: Occupational Therapy

## 2016-03-12 DIAGNOSIS — I890 Lymphedema, not elsewhere classified: Secondary | ICD-10-CM

## 2016-03-14 ENCOUNTER — Ambulatory Visit: Payer: Managed Care, Other (non HMO) | Admitting: Occupational Therapy

## 2016-03-14 DIAGNOSIS — I890 Lymphedema, not elsewhere classified: Secondary | ICD-10-CM

## 2016-03-16 ENCOUNTER — Ambulatory Visit: Payer: Managed Care, Other (non HMO) | Admitting: Occupational Therapy

## 2016-03-16 DIAGNOSIS — I890 Lymphedema, not elsewhere classified: Secondary | ICD-10-CM

## 2016-03-19 ENCOUNTER — Ambulatory Visit: Payer: Managed Care, Other (non HMO) | Admitting: Occupational Therapy

## 2016-03-19 DIAGNOSIS — I890 Lymphedema, not elsewhere classified: Secondary | ICD-10-CM | POA: Diagnosis not present

## 2016-03-19 NOTE — Therapy (Signed)
[place] MAIN [facility] SERVICES [address] [place], [place], [address] Phone: [phone]   Fax:  [phone]  Occupational Therapy Treatment  Patient Details  Name: Danielle Lin MRN: [id number] Date of Birth: Sep 15, 1975 No Data Recorded  Encounter Date: 03/14/2016    Past Medical History:  Diagnosis Date  . Hypertension     Past Surgical History:  Procedure Laterality Date  . CARPAL TUNNEL RELEASE Bilateral   . CHOLECYSTECTOMY    . GASTROPLASTY DUODENAL SWITCH      There were no vitals filed for this visit.      Subjective Assessment - 03/19/16 0855    Subjective  Pt presents for OT visit 12 to address moderate, Stage II, BLE/BLQ Lipo-lymphedema. Pt doing well today. No new complaints.   Patient is accompained by: Family member   Pertinent History SIPPS sx 04/25/15 w/ weight at 306 lbs. Current weight 212.6 lbs. HTN, Asthma, OSA ( uses CPAP) B CTR   Limitations BLE chronic leg pain and swelling, L>R, difficulty walking, decreased standing tolerance, decreased activity tolerance, difficulty w/ functional mobility/ transfers   Patient Stated Goals decrease leg swelling and pain and keep it from getting worse.   Pain Onset More than a month ago                      OT Treatments/Exercises (OP) - 03/19/16 0001      Manual Therapy   Manual therapy comments Sequential pneumatic compression device trial in clinic to LLE. Before and after circumferential anatomical BLE and trunkal measurements   Compression Bandaging RLE gradient compression wraps applied from base of toes to below knee as follows:                OT Education - 03/19/16 1444    Education provided Yes   Education Details Con tinued Equities trader Education  And LE ADL training throughout visit for lymphedema self care, including compression wrapping, compression garment and device wear/care, lymphatic pumping ther ex, simple self-MLD,  and skin care. Discussed progress towards goals.   Person(s) Educated Patient   Methods Explanation   Comprehension Verbalized understanding             OT Long Term Goals - 03/09/16 1051      OT LONG TERM GOAL #1   Title Lymphedema (LE) management/ self-care: Pt able to apply multi layered, gradient compression wraps with min caregiver assistance using proper techniques within 2 weeks to achieve optimal limb volume reduction.   Baseline dependent for LE self care   Time 2   Period Weeks   Status Not Met     OT LONG TERM GOAL #2   Title Lymphedema (LE) management/ self-care:  Pt to achieve at least 10% LLE limb volume reductions bilaterally during Intensive CDT to limit LE progression, decrease infection and falls risk, to reduce pain/, and to improve safe ambulation and functional mobility.   Baseline dependent for LE self care   Time 12   Period Weeks   Status Partially Met     OT LONG TERM GOAL #3   Title Lymphedema (LE) management/ self-care:  Pt >/= 85 % compliant with all daily, LE self-care protocols for home program w/ needed level of caregiver assistance , including simple self-manual lymphatic drainage (MLD), skin care, lymphatic pumping the ex, skin care, and donning/ doffing compression wraps and garments o limit LE progression and further functional decline.     Baseline dependent for  LE self care   Time 12   Period Weeks   Status Not Met     OT LONG TERM GOAL #4   Title Lymphedema (LE) management/ self-care:  Pt to tolerate daily compression wraps, garments and devices in keeping w/ prescribed wear regime within 1 week of issue date to progress and retain clinical and functional gains and to limit LE progression.   Baseline dependent for LE self care   Time 12   Period Weeks   Status Partially Met     OT LONG TERM GOAL #5   Title Lymphedema (LE) self-care:  During Management Phase CDT Pt to sustain limb volume reductions achieved during Intensive Phase CDT  within 5% utilizing LE self-care protocols, appropriate compression garments/ devices, and needed level of caregiver assistance.   Baseline dependent for LE self care   Time 12   Period Weeks   Status On-going               Plan - 03/19/16 1447    Clinical Impression Statement Pt reports decreased pain after Flexitouch sequetial pneumatic trial last visit. Pt continues to demonstrait progress towards clinical gains in terms of volume decrease in decreased tissue density below the knee on the R. Pt does not perform home program componenets, including compression wrapping and simple self MLD as instructed. Spouse has been educated to assist, but he is often unavailable.    Rehab Potential Good   OT Frequency 3x / week   OT Duration 12 weeks   OT Treatment/Interventions Self-care/ADL training;Therapeutic exercise;Patient/family education;Manual Therapy;Manual lymph drainage;Other (comment);DME and/or AE instruction;Compression bandaging;Therapeutic activities  skin care      Patient will benefit from skilled therapeutic intervention in order to improve the following deficits and impairments:  Decreased skin integrity, Decreased activity tolerance, Decreased knowledge of use of DME, Impaired flexibility, Decreased balance, Decreased mobility, Difficulty walking, Obesity, Increased edema, Pain, Abnormal gait  Visit Diagnosis: Lymphedema, not elsewhere classified    Problem List There are no active problems to display for this patient.   Andrey Spearman, MS, OTR/L, [facility] 03/19/16 3:03 PM  [place] MAIN [facility] SERVICES [address] [place], [place], [address] Phone: [phone]   Fax:  [phone]  Name: Danielle Lin MRN: [id number] Date of Birth: [birth year]

## 2016-03-19 NOTE — Patient Instructions (Signed)
LE instructions and precautions as established- see initial eval.   

## 2016-03-19 NOTE — Therapy (Signed)
[place] [facility] MAIN [facility] SERVICES [address] [place], [place], [address] Phone: [phone]   Fax:  [phone]  Occupational Therapy Treatment  Patient Details  Name: Danielle Lin MRN: [id number] Date of Birth: August 09, 1975 No Data Recorded  Encounter Date: 03/16/2016    Past Medical History:  Diagnosis Date  . Hypertension     Past Surgical History:  Procedure Laterality Date  . CARPAL TUNNEL RELEASE Bilateral   . CHOLECYSTECTOMY    . GASTROPLASTY DUODENAL SWITCH      There were no vitals filed for this visit.      Subjective Assessment - 03/19/16 0855    Subjective  Pt presents for OT visit 12 to address moderate, Stage II, BLE/BLQ Lipo-lymphedema. Pt doing well today. No new complaints.   Patient is accompained by: Family member   Pertinent History SIPPS sx 04/25/15 w/ weight at 306 lbs. Current weight 212.6 lbs. HTN, Asthma, OSA ( uses CPAP) B CTR   Limitations BLE chronic leg pain and swelling, L>R, difficulty walking, decreased standing tolerance, decreased activity tolerance, difficulty w/ functional mobility/ transfers   Patient Stated Goals decrease leg swelling and pain and keep it from getting worse.   Pain Onset More than a month ago                      OT Treatments/Exercises (OP) - 03/19/16 0001      Manual Therapy   Manual therapy comments Sequential pneumatic compression device trial in clinic to LLE. Before and after circumferential anatomical BLE and trunkal measurements   Compression Bandaging RLE gradient compression wraps applied from base of toes to below knee as follows:                OT Education - 03/19/16 1444    Education provided Yes   Education Details Con tinued Artist Education  And LE ADL training throughout visit for lymphedema self care, including compression wrapping, compression garment and device wear/care, lymphatic pumping ther ex, simple self-MLD,  and skin care. Discussed progress towards goals.   Person(s) Educated Patient   Methods Explanation   Comprehension Verbalized understanding             OT Long Term Goals - 03/09/16 1051      OT LONG TERM GOAL #1   Title Lymphedema (LE) management/ self-care: Pt able to apply multi layered, gradient compression wraps with min caregiver assistance using proper techniques within 2 weeks to achieve optimal limb volume reduction.   Baseline dependent for LE self care   Time 2   Period Weeks   Status Not Met     OT LONG TERM GOAL #2   Title Lymphedema (LE) management/ self-care:  Pt to achieve at least 10% LLE limb volume reductions bilaterally during Intensive CDT to limit LE progression, decrease infection and falls risk, to reduce pain/, and to improve safe ambulation and functional mobility.   Baseline dependent for LE self care   Time 12   Period Weeks   Status Partially Met     OT LONG TERM GOAL #3   Title Lymphedema (LE) management/ self-care:  Pt >/= 85 % compliant with all daily, LE self-care protocols for home program w/ needed level of caregiver assistance , including simple self-manual lymphatic drainage (MLD), skin care, lymphatic pumping the ex, skin care, and donning/ doffing compression wraps and garments o limit LE progression and further functional decline.     Baseline dependent for  LE self care   Time 12   Period Weeks   Status Not Met     OT LONG TERM GOAL #4   Title Lymphedema (LE) management/ self-care:  Pt to tolerate daily compression wraps, garments and devices in keeping w/ prescribed wear regime within 1 week of issue date to progress and retain clinical and functional gains and to limit LE progression.   Baseline dependent for LE self care   Time 12   Period Weeks   Status Partially Met     OT LONG TERM GOAL #5   Title Lymphedema (LE) self-care:  During Management Phase CDT Pt to sustain limb volume reductions achieved during Intensive Phase CDT  within 5% utilizing LE self-care protocols, appropriate compression garments/ devices, and needed level of caregiver assistance.   Baseline dependent for LE self care   Time 12   Period Weeks   Status On-going               Plan - 03/19/16 1447    Clinical Impression Statement Pt reports decreased pain after Flexitouch sequetial pneumatic trial last visit. Pt continues to demonstrait progress towards clinical gains in terms of volume decrease in decreased tissue density below the knee on the R. Pt does not perform home program componenets, including compression wrapping and simple self MLD as instructed. Spouse has been educated to assist, but he is often unavailable.    Rehab Potential Good   OT Frequency 3x / week   OT Duration 12 weeks   OT Treatment/Interventions Self-care/ADL training;Therapeutic exercise;Patient/family education;Manual Therapy;Manual lymph drainage;Other (comment);DME and/or AE instruction;Compression bandaging;Therapeutic activities  skin care      Patient will benefit from skilled therapeutic intervention in order to improve the following deficits and impairments:  Decreased skin integrity, Decreased activity tolerance, Decreased knowledge of use of DME, Impaired flexibility, Decreased balance, Decreased mobility, Difficulty walking, Obesity, Increased edema, Pain  Visit Diagnosis: Lymphedema, not elsewhere classified    Problem List There are no active problems to display for this patient.   Andrey Spearman, MS, OTR/L, [facility] 03/19/16 3:15 PM  [place] MAIN [facility] SERVICES [address] [place], [place], [address] Phone: [phone]   Fax:  [phone]  Name: Danielle Lin MRN: [id number] Date of Birth: 28-Mar-1976

## 2016-03-19 NOTE — Therapy (Signed)
[place] [facility] MAIN [facility] SERVICES [address] [place], [place], [address] Phone: [phone]   Fax:  [phone]  Occupational Therapy Treatment  Patient Details  Name: Danielle Lin MRN: [id number] Date of Birth: 12-08-1975 No Data Recorded  Encounter Date: 03/19/2016      OT End of Session - 03/19/16 1543    Visit Number 15   Number of Visits 36   Date for OT Re-Evaluation 05/06/16   OT Start Time 0805   OT Stop Time 0910   OT Time Calculation (min) 65 min   Activity Tolerance Patient tolerated treatment well;No increased pain   Behavior During Therapy WFL for tasks assessed/performed      Past Medical History:  Diagnosis Date  . Hypertension     Past Surgical History:  Procedure Laterality Date  . CARPAL TUNNEL RELEASE Bilateral   . CHOLECYSTECTOMY    . GASTROPLASTY DUODENAL SWITCH      There were no vitals filed for this visit.      Subjective Assessment - 03/19/16 1544    Subjective  Pt presents for OT visit 15 to address moderate, Stage II, BLE/BLQ Lipo-lymphedema. Pt reports that she spoke with insurance company and her Flexi System has been approved and is shipping. Pt is very pleased and thinks the device will assist her with LE self management.   Patient is accompained by: Family member   Pertinent History SIPPS sx 04/25/15 w/ weight at 306 lbs. Current weight 212.6 lbs. HTN, Asthma, OSA ( uses CPAP) B CTR   Limitations BLE chronic leg pain and swelling, L>R, difficulty walking, decreased standing tolerance, decreased activity tolerance, difficulty w/ functional mobility/ transfers   Patient Stated Goals decrease leg swelling and pain and keep it from getting worse.   Currently in Pain? No/denies   Pain Onset More than a month ago                      OT Treatments/Exercises (OP) - 03/19/16 1546      Manual Therapy   Manual Therapy Edema management;Compression Bandaging;Manual Lymphatic Drainage  (MLD)   Edema Management skin care to RLE as established   Manual Lymphatic Drainage (MLD) MLD to RLE as established   Compression Bandaging RLE gradient compression wraps applied from base of toes to below knee as follows:                OT Education - 03/19/16 1547    Education provided Yes   Education Details Cont LE self care edu   Person(s) Educated Patient   Methods Explanation   Comprehension Verbalized understanding             OT Long Term Goals - 03/09/16 1051      OT LONG TERM GOAL #1   Title Lymphedema (LE) management/ self-care: Pt able to apply multi layered, gradient compression wraps with min caregiver assistance using proper techniques within 2 weeks to achieve optimal limb volume reduction.   Baseline dependent for LE self care   Time 2   Period Weeks   Status Not Met     OT LONG TERM GOAL #2   Title Lymphedema (LE) management/ self-care:  Pt to achieve at least 10% LLE limb volume reductions bilaterally during Intensive CDT to limit LE progression, decrease infection and falls risk, to reduce pain/, and to improve safe ambulation and functional mobility.   Baseline dependent for LE self care   Time 12   Period  Weeks   Status Partially Met     OT LONG TERM GOAL #3   Title Lymphedema (LE) management/ self-care:  Pt >/= 85 % compliant with all daily, LE self-care protocols for home program w/ needed level of caregiver assistance , including simple self-manual lymphatic drainage (MLD), skin care, lymphatic pumping the ex, skin care, and donning/ doffing compression wraps and garments o limit LE progression and further functional decline.     Baseline dependent for LE self care   Time 12   Period Weeks   Status Not Met     OT LONG TERM GOAL #4   Title Lymphedema (LE) management/ self-care:  Pt to tolerate daily compression wraps, garments and devices in keeping w/ prescribed wear regime within 1 week of issue date to progress and retain clinical and  functional gains and to limit LE progression.   Baseline dependent for LE self care   Time 12   Period Weeks   Status Partially Met     OT LONG TERM GOAL #5   Title Lymphedema (LE) self-care:  During Management Phase CDT Pt to sustain limb volume reductions achieved during Intensive Phase CDT within 5% utilizing LE self-care protocols, appropriate compression garments/ devices, and needed level of caregiver assistance.   Baseline dependent for LE self care   Time 12   Period Weeks   Status On-going               Plan - 03/19/16 1550    Clinical Impression Statement Pt tolerating all aspects of LE care well in cclinic. DME vendor at [facility] agrees to complete measurement for compression garments. They acknowledge that Pt would like garment order completed before end of year so she is able to take advantage of insurance benefits. Info faxed to vendor. Pt awaiting delivery of pump by report. She remains basically non compliant w/ compression wrapping between visits. Cont as per POC.   Rehab Potential Good   OT Frequency 3x / week   OT Duration 12 weeks   OT Treatment/Interventions Self-care/ADL training;Therapeutic exercise;Patient/family education;Manual Therapy;Manual lymph drainage;Other (comment);DME and/or AE instruction;Compression bandaging;Therapeutic activities  skin care      Patient will benefit from skilled therapeutic intervention in order to improve the following deficits and impairments:  Decreased skin integrity, Decreased activity tolerance, Decreased knowledge of use of DME, Impaired flexibility, Decreased balance, Decreased mobility, Difficulty walking, Obesity, Increased edema, Pain, Abnormal gait  Visit Diagnosis: Lymphedema, not elsewhere classified    Problem List There are no active problems to display for this patient.   Andrey Spearman, MS, OTR/L, [facility] 03/19/16 4:14 PM   [place] MAIN [facility]  SERVICES [address] [place], [place], [address] Phone: [phone]   Fax:  [phone]  Name: Danielle Lin MRN: [id number] Date of Birth: Feb 26, 1976

## 2016-03-19 NOTE — Therapy (Addendum)
[place] MAIN [facility] SERVICES [address] [place], [place], [address] Phone: [phone]   Fax:  [phone]  Occupational Therapy Treatment  Patient Details  Name: Danielle Lin MRN: [id number] Date of Birth: Sep 23, 1975 No Data Recorded  Encounter Date: 03/12/2016    Past Medical History:  Diagnosis Date  . Hypertension     Past Surgical History:  Procedure Laterality Date  . CARPAL TUNNEL RELEASE Bilateral   . CHOLECYSTECTOMY    . GASTROPLASTY DUODENAL SWITCH      There were no vitals filed for this visit.      Subjective Assessment - 03/19/16 0855    Subjective  Pt presents for OT visit 12 to address moderate, Stage II, BLE/BLQ Lipo-lymphedema. Pt doing well today. No new complaints.   Patient is accompained by: Family member   Pertinent History SIPPS sx 04/25/15 w/ weight at 306 lbs. Current weight 212.6 lbs. HTN, Asthma, OSA ( uses CPAP) B CTR   Limitations BLE chronic leg pain and swelling, L>R, difficulty walking, decreased standing tolerance, decreased activity tolerance, difficulty w/ functional mobility/ transfers   Patient Stated Goals decrease leg swelling and pain and keep it from getting worse.   Pain Onset More than a month ago                                   OT Long Term Goals - 03/09/16 1051      OT LONG TERM GOAL #1   Title Lymphedema (LE) management/ self-care: Pt able to apply multi layered, gradient compression wraps with min caregiver assistance using proper techniques within 2 weeks to achieve optimal limb volume reduction.   Baseline dependent for LE self care   Time 2   Period Weeks   Status Not Met     OT LONG TERM GOAL #2   Title Lymphedema (LE) management/ self-care:  Pt to achieve at least 10% LLE limb volume reductions bilaterally during Intensive CDT to limit LE progression, decrease infection and falls risk, to reduce pain/, and to improve safe ambulation and  functional mobility.   Baseline dependent for LE self care   Time 12   Period Weeks   Status Partially Met     OT LONG TERM GOAL #3   Title Lymphedema (LE) management/ self-care:  Pt >/= 85 % compliant with all daily, LE self-care protocols for home program w/ needed level of caregiver assistance , including simple self-manual lymphatic drainage (MLD), skin care, lymphatic pumping the ex, skin care, and donning/ doffing compression wraps and garments o limit LE progression and further functional decline.     Baseline dependent for LE self care   Time 12   Period Weeks   Status Not Met     OT LONG TERM GOAL #4   Title Lymphedema (LE) management/ self-care:  Pt to tolerate daily compression wraps, garments and devices in keeping w/ prescribed wear regime within 1 week of issue date to progress and retain clinical and functional gains and to limit LE progression.   Baseline dependent for LE self care   Time 12   Period Weeks   Status Partially Met     OT LONG TERM GOAL #5   Title Lymphedema (LE) self-care:  During Management Phase CDT Pt to sustain limb volume reductions achieved during Intensive Phase CDT within 5% utilizing LE self-care protocols, appropriate compression garments/ devices, and needed level of caregiver  assistance.   Baseline dependent for LE self care   Time 12   Period Weeks   Status On-going             Patient will benefit from skilled therapeutic intervention in order to improve the following deficits and impairments:  Decreased skin integrity, Decreased activity tolerance, Decreased knowledge of use of DME, Impaired flexibility, Decreased balance, Decreased mobility, Difficulty walking, Obesity, Increased edema, Pain  Visit Diagnosis: Lymphedema, not elsewhere classified    Problem List There are no active problems to display for this patient.   Andrey Spearman, MS, OTR/L, [facility] 03/19/16 2:27 PM  [place]  MAIN [facility] SERVICES [address] [place], [place], [address] Phone: [phone]   Fax:  [phone]  Name: Danielle Lin MRN: [id number] Date of Birth: July 28, 1975

## 2016-03-21 ENCOUNTER — Ambulatory Visit: Payer: Managed Care, Other (non HMO) | Admitting: Occupational Therapy

## 2016-03-21 DIAGNOSIS — I890 Lymphedema, not elsewhere classified: Secondary | ICD-10-CM

## 2016-03-21 NOTE — Therapy (Signed)
[place] MAIN [facility] SERVICES [address] [place], [place], [address] Phone: [phone]   Fax:  [phone]  Occupational Therapy Treatment  Patient Details  Name: Danielle Lin MRN: [id number] Date of Birth: 12/28/1975 No Data Recorded  Encounter Date: 03/21/2016      OT End of Session - 03/21/16 1002    Visit Number 16   Number of Visits 36   Date for OT Re-Evaluation 05/06/16   OT Start Time 0800   OT Stop Time 0900   OT Time Calculation (min) 60 min      Past Medical History:  Diagnosis Date  . Hypertension     Past Surgical History:  Procedure Laterality Date  . CARPAL TUNNEL RELEASE Bilateral   . CHOLECYSTECTOMY    . GASTROPLASTY DUODENAL SWITCH      There were no vitals filed for this visit.      Subjective Assessment - 03/21/16 0959    Subjective  Pt presents for OT visit 16 to address moderate, Stage II, BLE/BLQ Lipo-lymphedema. Pt states she feels more swollen today after starting her period last night.   Patient is accompained by: Family member   Pertinent History SIPPS sx 04/25/15 w/ weight at 306 lbs. Current weight 212.6 lbs. HTN, Asthma, OSA ( uses CPAP) B CTR   Limitations BLE chronic leg pain and swelling, L>R, difficulty walking, decreased standing tolerance, decreased activity tolerance, difficulty w/ functional mobility/ transfers   Patient Stated Goals decrease leg swelling and pain and keep it from getting worse.   Currently in Pain? No/denies   Pain Onset More than a month ago                      OT Treatments/Exercises (OP) - 03/21/16 0001      ADLs   ADL Education Given Yes     Manual Therapy   Manual Therapy Edema management;Compression Bandaging;Manual Lymphatic Drainage (MLD)   Edema Management skin care to RLE as established   Manual Lymphatic Drainage (MLD) MLD to RLE as established   Compression Bandaging RLE gradient compression wraps applied from base of toes to  below knee as follows:                OT Education - 03/21/16 1001    Education provided Yes   Education Details Emphasis of E self-care edu today on custom compression measurement and fitting process.   Person(s) Educated Patient   Methods Explanation   Comprehension Verbalized understanding;Need further instruction             OT Long Term Goals - 03/09/16 1051      OT LONG TERM GOAL #1   Title Lymphedema (LE) management/ self-care: Pt able to apply multi layered, gradient compression wraps with min caregiver assistance using proper techniques within 2 weeks to achieve optimal limb volume reduction.   Baseline dependent for LE self care   Time 2   Period Weeks   Status Not Met     OT LONG TERM GOAL #2   Title Lymphedema (LE) management/ self-care:  Pt to achieve at least 10% LLE limb volume reductions bilaterally during Intensive CDT to limit LE progression, decrease infection and falls risk, to reduce pain/, and to improve safe ambulation and functional mobility.   Baseline dependent for LE self care   Time 12   Period Weeks   Status Partially Met     OT LONG TERM GOAL #3   Title  Lymphedema (LE) management/ self-care:  Pt >/= 85 % compliant with all daily, LE self-care protocols for home program w/ needed level of caregiver assistance , including simple self-manual lymphatic drainage (MLD), skin care, lymphatic pumping the ex, skin care, and donning/ doffing compression wraps and garments o limit LE progression and further functional decline.     Baseline dependent for LE self care   Time 12   Period Weeks   Status Not Met     OT LONG TERM GOAL #4   Title Lymphedema (LE) management/ self-care:  Pt to tolerate daily compression wraps, garments and devices in keeping w/ prescribed wear regime within 1 week of issue date to progress and retain clinical and functional gains and to limit LE progression.   Baseline dependent for LE self care   Time 12   Period Weeks    Status Partially Met     OT LONG TERM GOAL #5   Title Lymphedema (LE) self-care:  During Management Phase CDT Pt to sustain limb volume reductions achieved during Intensive Phase CDT within 5% utilizing LE self-care protocols, appropriate compression garments/ devices, and needed level of caregiver assistance.   Baseline dependent for LE self care   Time 12   Period Weeks   Status On-going               Plan - 03/21/16 1002    Clinical Impression Statement RLE is slightly more swollen below the knee and a slight bit more dense to palpation today, but overall clinical gains continue to progress towards goals. Vendor contacted and IT consultant sent. Pt agrees w/ plan to commence LLE knee high wraps next week towards garment fitting goals.   Rehab Potential Good   OT Frequency 3x / week   OT Duration 12 weeks   OT Treatment/Interventions Self-care/ADL training;Therapeutic exercise;Patient/family education;Manual Therapy;Manual lymph drainage;Other (comment);DME and/or AE instruction;Compression bandaging;Therapeutic activities  skin care      Patient will benefit from skilled therapeutic intervention in order to improve the following deficits and impairments:  Decreased skin integrity, Decreased activity tolerance, Decreased knowledge of use of DME, Impaired flexibility, Decreased balance, Decreased mobility, Difficulty walking, Obesity, Increased edema, Pain, Abnormal gait  Visit Diagnosis: Lymphedema, not elsewhere classified    Problem List There are no active problems to display for this patient.   Andrey Spearman, MS, OTR/L, [facility] 03/21/16 10:05 AM  [place] MAIN [facility] SERVICES [place], [place], [address] Phone: [phone]   Fax:  [phone]  Name: Danielle Lin MRN: [id number] Date of Birth: 1975/06/22

## 2016-03-21 NOTE — Patient Instructions (Signed)
LE instructions and precautions as established- see initial eval.   

## 2016-03-26 ENCOUNTER — Ambulatory Visit: Payer: Managed Care, Other (non HMO) | Admitting: Occupational Therapy

## 2016-03-26 DIAGNOSIS — I890 Lymphedema, not elsewhere classified: Secondary | ICD-10-CM | POA: Diagnosis not present

## 2016-03-26 NOTE — Therapy (Signed)
[place] MAIN [facility] SERVICES [address] [place], [place], [address] Phone: [phone]   Fax:  [phone]  Occupational Therapy Treatment  Patient Details  Name: Danielle Lin MRN: [id number] Date of Birth: [birth year] No Data Recorded  Encounter Date: 03/26/2016      OT End of Session - 03/26/16 0915    Visit Number 17   Number of Visits 36   Date for OT Re-Evaluation 05/06/16   OT Start Time 0800   OT Stop Time 0907   OT Time Calculation (min) 67 min   Activity Tolerance Patient tolerated treatment well;No increased pain   Behavior During Therapy WFL for tasks assessed/performed      Past Medical History:  Diagnosis Date  . Hypertension     Past Surgical History:  Procedure Laterality Date  . CARPAL TUNNEL RELEASE Bilateral   . CHOLECYSTECTOMY    . GASTROPLASTY DUODENAL SWITCH      There were no vitals filed for this visit.      Subjective Assessment - 03/26/16 0910    Subjective  Pt presents for OT visit 17 to address moderate, Stage 3-4, BLE/BLQ Lipo-lymphedema. Pt states Fldexitouc system ( sequential pneumatic compression device) was delivered over the weekend. "I need to call the vendor to set up  the training." Also , began discussion re HOS device options .    Patient is accompained by: Family member   Pertinent History SIPPS sx 04/25/15 w/ weight at 306 lbs. Current weight 212.6 lbs. HTN, Asthma, OSA ( uses CPAP) B CTR   Limitations BLE chronic leg pain and swelling, L>R, difficulty walking, decreased standing tolerance, decreased activity tolerance, difficulty w/ functional mobility/ transfers   Patient Stated Goals decrease leg swelling and pain and keep it from getting worse.   Currently in Pain? No/denies   Pain Onset More than a month ago                      OT Treatments/Exercises (OP) - 03/26/16 0001      ADLs   ADL Education Given Yes     Manual Therapy   Manual Therapy Edema  management;Compression Bandaging;Manual Lymphatic Drainage (MLD)   Edema Management skin care to LLE as established   Manual Lymphatic Drainage (MLD) MLD to LLE as established   Compression Bandaging LLE gradient compression wraps applied from base of toes to below knee as follows:                OT Education - 03/26/16 0914    Education provided Yes   Education Details Began Pt ADL training for LE  HOS lymphedema devices- explained rational and various options.   Person(s) Educated Patient   Methods Explanation   Comprehension Verbalized understanding             OT Long Term Goals - 03/09/16 1051      OT LONG TERM GOAL #1   Title Lymphedema (LE) management/ self-care: Pt able to apply multi layered, gradient compression wraps with min caregiver assistance using proper techniques within 2 weeks to achieve optimal limb volume reduction.   Baseline dependent for LE self care   Time 2   Period Weeks   Status Not Met     OT LONG TERM GOAL #2   Title Lymphedema (LE) management/ self-care:  Pt to achieve at least 10% LLE limb volume reductions bilaterally during Intensive CDT to limit LE progression, decrease infection and falls risk, to reduce  pain/, and to improve safe ambulation and functional mobility.   Baseline dependent for LE self care   Time 12   Period Weeks   Status Partially Met     OT LONG TERM GOAL #3   Title Lymphedema (LE) management/ self-care:  Pt >/= 85 % compliant with all daily, LE self-care protocols for home program w/ needed level of caregiver assistance , including simple self-manual lymphatic drainage (MLD), skin care, lymphatic pumping the ex, skin care, and donning/ doffing compression wraps and garments o limit LE progression and further functional decline.     Baseline dependent for LE self care   Time 12   Period Weeks   Status Not Met     OT LONG TERM GOAL #4   Title Lymphedema (LE) management/ self-care:  Pt to tolerate daily  compression wraps, garments and devices in keeping w/ prescribed wear regime within 1 week of issue date to progress and retain clinical and functional gains and to limit LE progression.   Baseline dependent for LE self care   Time 12   Period Weeks   Status Partially Met     OT LONG TERM GOAL #5   Title Lymphedema (LE) self-care:  During Management Phase CDT Pt to sustain limb volume reductions achieved during Intensive Phase CDT within 5% utilizing LE self-care protocols, appropriate compression garments/ devices, and needed level of caregiver assistance.   Baseline dependent for LE self care   Time 12   Period Weeks   Status On-going               Plan - 03/26/16 0916    Clinical Impression Statement Commenced CDT to LLE today with MLD, skin care and compression wraps from ankle to groin using gradient techniques established on RLE initially. No wraps to RLE today to ensure safe ambulation and functional mobility. Will assess RLE next visit and resume wrapping if increased swelling is noted.   Rehab Potential Good   OT Frequency 3x / week   OT Duration 12 weeks   OT Treatment/Interventions Self-care/ADL training;Therapeutic exercise;Patient/family education;Manual Therapy;Manual lymph drainage;Other (comment);DME and/or AE instruction;Compression bandaging;Therapeutic activities  skin care      Patient will benefit from skilled therapeutic intervention in order to improve the following deficits and impairments:  Decreased skin integrity, Decreased activity tolerance, Decreased knowledge of use of DME, Impaired flexibility, Decreased balance, Decreased mobility, Difficulty walking, Obesity, Increased edema, Pain, Abnormal gait  Visit Diagnosis: Lymphedema, not elsewhere classified    Problem List There are no active problems to display for this patient.   Andrey Spearman, MS, OTR/L, [facility] 03/26/16 9:19 AM  [place] MAIN [facility]  SERVICES [place], [place], [address] Phone: [phone]   Fax:  [phone]  Name: Sallyann Kinnaird MRN: [id number] Date of Birth: 1975-06-08

## 2016-03-26 NOTE — Patient Instructions (Signed)
LE instructions and precautions as established- see initial eval.   

## 2016-03-28 ENCOUNTER — Ambulatory Visit: Payer: Managed Care, Other (non HMO) | Admitting: Occupational Therapy

## 2016-03-28 DIAGNOSIS — I890 Lymphedema, not elsewhere classified: Secondary | ICD-10-CM

## 2016-03-28 NOTE — Patient Instructions (Signed)
LE instructions and precautions as established- see initial eval.   

## 2016-03-28 NOTE — Therapy (Signed)
[place] MAIN [facility] SERVICES [address] [place], [place], [address] Phone: [phone]   Fax:  [phone]  Occupational Therapy Treatment  Patient Details  Name: Alishba [place] MRN: [id number] Date of Birth: 1975-10-18 No Data Recorded  Encounter Date: 03/28/2016      OT End of Session - 03/28/16 1125    Visit Number 18   Number of Visits 36   Date for OT Re-Evaluation 05/06/16   OT Start Time 0802   OT Stop Time 0907   OT Time Calculation (min) 65 min   Activity Tolerance Patient tolerated treatment well;No increased pain   Behavior During Therapy WFL for tasks assessed/performed      Past Medical History:  Diagnosis Date  . Hypertension     Past Surgical History:  Procedure Laterality Date  . CARPAL TUNNEL RELEASE Bilateral   . CHOLECYSTECTOMY    . GASTROPLASTY DUODENAL SWITCH      There were no vitals filed for this visit.      Subjective Assessment - 03/28/16 1121    Subjective  Pt presents for OT visit 18 to address moderate, Stage 3-4, BLE/BLQ Lipo-lymphedema. Pt reports Flexitouch trainer came to her home yesterday and assisted her with set up and opperation of the device. "I used it for the first time for an hour. My leg definitely feels softer."   Patient is accompained by: Family member   Pertinent History SIPPS sx 04/25/15 w/ weight at 306 lbs. Current weight 212.6 lbs. HTN, Asthma, OSA ( uses CPAP) B CTR   Limitations BLE chronic leg pain and swelling, L>R, difficulty walking, decreased standing tolerance, decreased activity tolerance, difficulty w/ functional mobility/ transfers   Patient Stated Goals decrease leg swelling and pain and keep it from getting worse.   Currently in Pain? No/denies   Pain Onset More than a month ago                      OT Treatments/Exercises (OP) - 03/28/16 0001      ADLs   ADL Education Given Yes     Manual Therapy   Manual Therapy Edema  management;Compression Bandaging;Manual Lymphatic Drainage (MLD)   Edema Management skin care to LLE as established   Manual Lymphatic Drainage (MLD) MLD to LLE as established   Compression Bandaging RLE wrapped from foot to below knee w/ 3 layer circumferential gradient wrap. LLE gradient compression wraps applied from base of toes to below knee as follows:                OT Education - 03/28/16 1123    Education provided Yes   Education Details Pt edu emphasis today on HOS devices- pros and cons of several options and discussion re practical considerations and compliance in addition to clinical   benefits. Pt and therpist together selected mid thigh length Jobst Relax mid thigh length. [facility] emailed.   Person(s) Educated Patient   Methods Explanation;Demonstration   Comprehension Verbalized understanding;Returned demonstration;Need further instruction             OT Long Term Goals - 03/09/16 1051      OT LONG TERM GOAL #1   Title Lymphedema (LE) management/ self-care: Pt able to apply multi layered, gradient compression wraps with min caregiver assistance using proper techniques within 2 weeks to achieve optimal limb volume reduction.   Baseline dependent for LE self care   Time 2   Period Weeks   Status Not Met  OT LONG TERM GOAL #2   Title Lymphedema (LE) management/ self-care:  Pt to achieve at least 10% LLE limb volume reductions bilaterally during Intensive CDT to limit LE progression, decrease infection and falls risk, to reduce pain/, and to improve safe ambulation and functional mobility.   Baseline dependent for LE self care   Time 12   Period Weeks   Status Partially Met     OT LONG TERM GOAL #3   Title Lymphedema (LE) management/ self-care:  Pt >/= 85 % compliant with all daily, LE self-care protocols for home program w/ needed level of caregiver assistance , including simple self-manual lymphatic drainage (MLD), skin care, lymphatic pumping the ex,  skin care, and donning/ doffing compression wraps and garments o limit LE progression and further functional decline.     Baseline dependent for LE self care   Time 12   Period Weeks   Status Not Met     OT LONG TERM GOAL #4   Title Lymphedema (LE) management/ self-care:  Pt to tolerate daily compression wraps, garments and devices in keeping w/ prescribed wear regime within 1 week of issue date to progress and retain clinical and functional gains and to limit LE progression.   Baseline dependent for LE self care   Time 12   Period Weeks   Status Partially Met     OT LONG TERM GOAL #5   Title Lymphedema (LE) self-care:  During Management Phase CDT Pt to sustain limb volume reductions achieved during Intensive Phase CDT within 5% utilizing LE self-care protocols, appropriate compression garments/ devices, and needed level of caregiver assistance.   Baseline dependent for LE self care   Time 12   Period Weeks   Status On-going               Plan - 03/28/16 1126    Clinical Impression Statement Emphasis of visit today on    Rehab Potential Good   OT Frequency 3x / week   OT Duration 12 weeks   OT Treatment/Interventions Self-care/ADL training;Therapeutic exercise;Patient/family education;Manual Therapy;Manual lymph drainage;Other (comment);DME and/or AE instruction;Compression bandaging;Therapeutic activities  skin care   Plan Emphasis of visit today on manual therapy and Pt edu for HOS compression. LLE is palpably less dense below the knee since last visit. Pt has slight rash on posterior leg and thigh, which patient tells me is not atypical. Pt has received Flexi touch system and home training is complete. She is satisfied /with device thus far and feels it has helped her LLE already. Emailed DME vendor again re compression garment reccommendations as they have not responded to 11/20 email. Cont as per POC.      Patient will benefit from skilled therapeutic intervention in  order to improve the following deficits and impairments:  Decreased skin integrity, Decreased activity tolerance, Decreased knowledge of use of DME, Impaired flexibility, Decreased balance, Decreased mobility, Difficulty walking, Obesity, Increased edema, Pain, Abnormal gait  Visit Diagnosis: Lymphedema, not elsewhere classified    Problem List There are no active problems to display for this patient.   Andrey Spearman, MS, OTR/L, [facility] 03/28/16 11:30 AM  [place] MAIN [facility] SERVICES [address] [place], [place], [address] Phone: [phone]   Fax:  [phone]  Name: Carlisha Wisler MRN: [id number] Date of Birth: [birth year]

## 2016-03-30 ENCOUNTER — Ambulatory Visit: Payer: Managed Care, Other (non HMO) | Attending: Family Medicine | Admitting: Occupational Therapy

## 2016-03-30 DIAGNOSIS — I890 Lymphedema, not elsewhere classified: Secondary | ICD-10-CM | POA: Diagnosis present

## 2016-03-30 NOTE — Therapy (Signed)
[place] MAIN [facility] SERVICES [address] [place], [place], [address] Phone: [phone]   Fax:  [phone]  Occupational Therapy Treatment  Patient Details  Name: Danielle Lin MRN: [id number] Date of Birth: [birth year] No Data Recorded  Encounter Date: 03/30/2016      OT End of Session - 03/30/16 1208    Visit Number 19   Number of Visits 36   Date for OT Re-Evaluation 05/06/16   OT Start Time 0803   OT Stop Time 0904   OT Time Calculation (min) 61 min   Activity Tolerance Patient tolerated treatment well;No increased pain   Behavior During Therapy WFL for tasks assessed/performed      Past Medical History:  Diagnosis Date  . Hypertension     Past Surgical History:  Procedure Laterality Date  . CARPAL TUNNEL RELEASE Bilateral   . CHOLECYSTECTOMY    . GASTROPLASTY DUODENAL SWITCH      There were no vitals filed for this visit.      Subjective Assessment - 03/30/16 1203    Subjective  Pt presents for OT visit 19 to address moderate, Stage 3-4, BLE/BLQ Lipo-lymphedema. Pt presents with mild itchy rash in b;lotches on posteriaor LLE. "It was a lot worse before I put my cream on it. I get so many rashes on my legs."   Patient is accompained by: Family member   Pertinent History SIPPS sx 04/25/15 w/ weight at 306 lbs. Current weight 212.6 lbs. HTN, Asthma, OSA ( uses CPAP) B CTR   Limitations BLE chronic leg pain and swelling, L>R, difficulty walking, decreased standing tolerance, decreased activity tolerance, difficulty w/ functional mobility/ transfers   Patient Stated Goals decrease leg swelling and pain and keep it from getting worse.   Currently in Pain? No/denies   Pain Onset More than a month ago                      OT Treatments/Exercises (OP) - 03/30/16 0001      ADLs   ADL Education Given Yes     Manual Therapy   Manual Therapy Edema management;Manual Lymphatic Drainage (MLD);Compression  Bandaging   Edema Management skin care to RLE as established   Manual Lymphatic Drainage (MLD) MLD to BLE as established w/ emphasis to RLE troday.   Compression Bandaging RLE wraped from ankle to groin as established. No wraps to LLE until rash resolves.                OT Education - 03/30/16 1204    Education provided Yes   Education Details Emphasis of LE self-care edu today on cellulitis etiology, treatment and precautions.    Person(s) Educated Patient   Methods Explanation   Comprehension Verbalized understanding;Need further instruction             OT Long Term Goals - 03/09/16 1051      OT LONG TERM GOAL #1   Title Lymphedema (LE) management/ self-care: Pt able to apply multi layered, gradient compression wraps with min caregiver assistance using proper techniques within 2 weeks to achieve optimal limb volume reduction.   Baseline dependent for LE self care   Time 2   Period Weeks   Status Not Met     OT LONG TERM GOAL #2   Title Lymphedema (LE) management/ self-care:  Pt to achieve at least 10% LLE limb volume reductions bilaterally during Intensive CDT to limit LE progression, decrease infection and falls risk, to  reduce pain/, and to improve safe ambulation and functional mobility.   Baseline dependent for LE self care   Time 12   Period Weeks   Status Partially Met     OT LONG TERM GOAL #3   Title Lymphedema (LE) management/ self-care:  Pt >/= 85 % compliant with all daily, LE self-care protocols for home program w/ needed level of caregiver assistance , including simple self-manual lymphatic drainage (MLD), skin care, lymphatic pumping the ex, skin care, and donning/ doffing compression wraps and garments o limit LE progression and further functional decline.     Baseline dependent for LE self care   Time 12   Period Weeks   Status Not Met     OT LONG TERM GOAL #4   Title Lymphedema (LE) management/ self-care:  Pt to tolerate daily compression wraps,  garments and devices in keeping w/ prescribed wear regime within 1 week of issue date to progress and retain clinical and functional gains and to limit LE progression.   Baseline dependent for LE self care   Time 12   Period Weeks   Status Partially Met     OT LONG TERM GOAL #5   Title Lymphedema (LE) self-care:  During Management Phase CDT Pt to sustain limb volume reductions achieved during Intensive Phase CDT within 5% utilizing LE self-care protocols, appropriate compression garments/ devices, and needed level of caregiver assistance.   Baseline dependent for LE self care   Time 12   Period Weeks   Status On-going               Plan - 03/30/16 1210    Clinical Impression Statement Pt with rash on posterior LLE from ankle to groin in blotch configuration. No signs or symptoms of of infection, but reviewed cellulitis etilogy, precautions, and treatment recommendations. Pt  instructed Pt to monitor skin condition carefully during visit interval and call MD prn. No wraps to LLE today and minimal MLD 2/2 rash.  Email from DME vendor yesterday confirming that they are working on Pt's garments. Cont as per POC.    Rehab Potential Good   OT Frequency 3x / week   OT Duration 12 weeks   OT Treatment/Interventions Self-care/ADL training;Therapeutic exercise;Patient/family education;Manual Therapy;Manual lymph drainage;Other (comment);DME and/or AE instruction;Compression bandaging;Therapeutic activities  skin care      Patient will benefit from skilled therapeutic intervention in order to improve the following deficits and impairments:  Decreased skin integrity, Decreased activity tolerance, Decreased knowledge of use of DME, Impaired flexibility, Decreased balance, Decreased mobility, Difficulty walking, Obesity, Increased edema, Pain, Abnormal gait  Visit Diagnosis: Lymphedema, not elsewhere classified    Problem List There are no active problems to display for this  patient.   Andrey Spearman, MS, OTR/L, [facility] 03/30/16 12:13 PM   [place] MAIN [facility] SERVICES [address] [place], [place], [address] Phone: [phone]   Fax:  [phone]  Name: Whitnie Deleon MRN: [id number] Date of Birth: 1975/06/27

## 2016-04-02 ENCOUNTER — Ambulatory Visit: Payer: Managed Care, Other (non HMO) | Admitting: Occupational Therapy

## 2016-04-02 DIAGNOSIS — I890 Lymphedema, not elsewhere classified: Secondary | ICD-10-CM | POA: Diagnosis not present

## 2016-04-02 NOTE — Therapy (Signed)
[facility] MAIN REHAB SERVICES [address] , [place], [address] Phone: [phone]   Fax:  [phone]  Occupational Therapy Treatment  Patient Details  Name: Danielle Lin MRN: [id number] Date of Birth: 02/07/1976 No Data Recorded  Encounter Date: 04/02/2016      OT End of Session - 04/02/16 0903    Visit Number 20   Number of Visits 36   Date for OT Re-Evaluation 05/06/16   OT Start Time 0800   OT Stop Time 0903   OT Time Calculation (min) 63 min      Past Medical History:  Diagnosis Date  . Hypertension     Past Surgical History:  Procedure Laterality Date  . CARPAL TUNNEL RELEASE Bilateral   . CHOLECYSTECTOMY    . GASTROPLASTY DUODENAL SWITCH      There were no vitals filed for this visit.      Subjective Assessment - 04/02/16 0900    Subjective  Pt presents for OT visit 20 to address moderate, Stage 3-4, BLE/BLQ Lipo-lymphedema. LLE rash noted last 2 visits is nearly resolved.   Patient is accompained by: Family member   Pertinent History SIPPS sx 04/25/15 w/ weight at 306 lbs. Current weight 212.6 lbs. HTN, Asthma, OSA ( uses CPAP) B CTR   Limitations BLE chronic leg pain and swelling, L>R, difficulty walking, decreased standing tolerance, decreased activity tolerance, difficulty w/ functional mobility/ transfers   Patient Stated Goals decrease leg swelling and pain and keep it from getting worse.   Currently in Pain? No/denies   Pain Onset More than a month ago                      OT Treatments/Exercises (OP) - 04/02/16 0001      ADLs   ADL Education Given Yes     Manual Therapy   Manual Therapy Edema management;Manual Lymphatic Drainage (MLD);Compression Bandaging   Edema Management skin care to LLE as established   Manual Lymphatic Drainage (MLD) MLD to LLE as established.   Compression Bandaging RLE wraped from ankle to groin as established. No wraps to LLE until rash resolves.                 OT Education - 04/02/16 0903    Education provided Yes   Education Details Cont LE self care training   Person(s) Educated Patient   Methods Explanation;Demonstration   Comprehension Verbalized understanding             OT Long Term Goals - 03/09/16 1051      OT LONG TERM GOAL #1   Title Lymphedema (LE) management/ self-care: Pt able to apply multi layered, gradient compression wraps with min caregiver assistance using proper techniques within 2 weeks to achieve optimal limb volume reduction.   Baseline dependent for LE self care   Time 2   Period Weeks   Status Not Met     OT LONG TERM GOAL #2   Title Lymphedema (LE) management/ self-care:  Pt to achieve at least 10% LLE limb volume reductions bilaterally during Intensive CDT to limit LE progression, decrease infection and falls risk, to reduce pain/, and to improve safe ambulation and functional mobility.   Baseline dependent for LE self care   Time 12   Period Weeks   Status Partially Met     OT LONG TERM GOAL #3   Title Lymphedema (LE) management/ self-care:  Pt >/= 85 % compliant with all daily,   LE self-care protocols for home program w/ needed level of caregiver assistance , including simple self-manual lymphatic drainage (MLD), skin care, lymphatic pumping the ex, skin care, and donning/ doffing compression wraps and garments o limit LE progression and further functional decline.     Baseline dependent for LE self care   Time 12   Period Weeks   Status Not Met     OT LONG TERM GOAL #4   Title Lymphedema (LE) management/ self-care:  Pt to tolerate daily compression wraps, garments and devices in keeping w/ prescribed wear regime within 1 week of issue date to progress and retain clinical and functional gains and to limit LE progression.   Baseline dependent for LE self care   Time 12   Period Weeks   Status Partially Met     OT LONG TERM GOAL #5   Title Lymphedema (LE) self-care:  During  Management Phase CDT Pt to sustain limb volume reductions achieved during Intensive Phase CDT within 5% utilizing LE self-care protocols, appropriate compression garments/ devices, and needed level of caregiver assistance.   Baseline dependent for LE self care   Time 12   Period Weeks   Status On-going               Plan - 04/02/16 1008    Clinical Impression Statement LLE rash has nearly resolved during visit interval. LLE appears minimally decreased in limb volume/swelling and tissue density since commencing CDT. Pt continues to perform diligent skin care and she is utilizing pump during visit intervals, but remains non-compliant w/ compression wrapping at home. Vendor is working on [employer] for garments and HOS device. Cont as per POC. Fit garments ASAP.   Rehab Potential Good   OT Frequency 3x / week   OT Duration 12 weeks   OT Treatment/Interventions Self-care/ADL training;Therapeutic exercise;Patient/family education;Manual Therapy;Manual lymph drainage;Other (comment);DME and/or AE instruction;Compression bandaging;Therapeutic activities  skin care      Patient will benefit from skilled therapeutic intervention in order to improve the following deficits and impairments:  Decreased skin integrity, Decreased activity tolerance, Decreased knowledge of use of DME, Impaired flexibility, Decreased balance, Decreased mobility, Difficulty walking, Obesity, Increased edema, Pain, Abnormal gait  Visit Diagnosis: Lymphedema, not elsewhere classified    Problem List There are no active problems to display for this patient.   Andrey Spearman, MS, OTR/L, [facility] 04/02/16 10:12 AM   [place] MAIN [facility] SERVICES [address] [place], [place], [address] Phone: [phone]   Fax:  [phone]  Name: Danielle Lin MRN: [id number] Date of Birth: February 09, 1976

## 2016-04-02 NOTE — Patient Instructions (Signed)
LE instructions and precautions as established- see initial eval.   

## 2016-04-04 ENCOUNTER — Ambulatory Visit: Payer: Managed Care, Other (non HMO) | Admitting: Occupational Therapy

## 2016-04-04 DIAGNOSIS — I890 Lymphedema, not elsewhere classified: Secondary | ICD-10-CM

## 2016-04-04 NOTE — Therapy (Signed)
[place] MAIN [facility] SERVICES [address] [place], [place], [address] Phone: [phone]   Fax:  [phone]  Occupational Therapy Treatment  Patient Details  Name: Roben Tatsch MRN: [id number] Date of Birth: 16-Sep-1975 No Data Recorded  Encounter Date: 04/04/2016      OT End of Session - 04/04/16 1124    Visit Number 21   Number of Visits 36   Date for OT Re-Evaluation 05/06/16   OT Start Time 0805   OT Stop Time 0900   OT Time Calculation (min) 55 min      Past Medical History:  Diagnosis Date  . Hypertension     Past Surgical History:  Procedure Laterality Date  . CARPAL TUNNEL RELEASE Bilateral   . CHOLECYSTECTOMY    . GASTROPLASTY DUODENAL SWITCH      There were no vitals filed for this visit.      Subjective Assessment - 04/04/16 1120    Subjective  Pt presents for OT visit 21 to address moderate, Stage 3-4, BLE/BLQ Lipo-lymphedema. Pt tells me that she has not yet called her insurance company to check visit limit. Pt reports that she has appointment today at 3 PM with DME provider for compression garment and HODS device measurements.   Patient is accompained by: Family member   Pertinent History SIPPS sx 04/25/15 w/ weight at 306 lbs. Current weight 212.6 lbs. HTN, Asthma, OSA ( uses CPAP) B CTR   Limitations BLE chronic leg pain and swelling, L>R, difficulty walking, decreased standing tolerance, decreased activity tolerance, difficulty w/ functional mobility/ transfers   Patient Stated Goals decrease leg swelling and pain and keep it from getting worse.   Currently in Pain? No/denies   Pain Onset More than a month ago                      OT Treatments/Exercises (OP) - 04/04/16 0001      ADLs   ADL Education Given Yes     Manual Therapy   Manual Therapy Edema management;Manual Lymphatic Drainage (MLD);Compression Bandaging   Edema Management skin care to LLE as established   Manual Lymphatic  Drainage (MLD) MLD to LLE as established.   Compression Bandaging RLE wraped from ankle to groin as established. No wraps to LLE until rash resolves.                OT Education - 04/04/16 1121    Education provided Yes   Education Details Cont LE self care training throughout session with emphasis on rational for garment recommendations and specifications and on assessing / troubleshooting fit and and function. Completed Pt edu for simple self MLD.   Person(s) Educated Patient   Methods Explanation   Comprehension Verbalized understanding             OT Long Term Goals - 03/09/16 1051      OT LONG TERM GOAL #1   Title Lymphedema (LE) management/ self-care: Pt able to apply multi layered, gradient compression wraps with min caregiver assistance using proper techniques within 2 weeks to achieve optimal limb volume reduction.   Baseline dependent for LE self care   Time 2   Period Weeks   Status Not Met     OT LONG TERM GOAL #2   Title Lymphedema (LE) management/ self-care:  Pt to achieve at least 10% LLE limb volume reductions bilaterally during Intensive CDT to limit LE progression, decrease infection and falls risk, to reduce pain/,  and to improve safe ambulation and functional mobility.   Baseline dependent for LE self care   Time 12   Period Weeks   Status Partially Met     OT LONG TERM GOAL #3   Title Lymphedema (LE) management/ self-care:  Pt >/= 85 % compliant with all daily, LE self-care protocols for home program w/ needed level of caregiver assistance , including simple self-manual lymphatic drainage (MLD), skin care, lymphatic pumping the ex, skin care, and donning/ doffing compression wraps and garments o limit LE progression and further functional decline.     Baseline dependent for LE self care   Time 12   Period Weeks   Status Not Met     OT LONG TERM GOAL #4   Title Lymphedema (LE) management/ self-care:  Pt to tolerate daily compression wraps,  garments and devices in keeping w/ prescribed wear regime within 1 week of issue date to progress and retain clinical and functional gains and to limit LE progression.   Baseline dependent for LE self care   Time 12   Period Weeks   Status Partially Met     OT LONG TERM GOAL #5   Title Lymphedema (LE) self-care:  During Management Phase CDT Pt to sustain limb volume reductions achieved during Intensive Phase CDT within 5% utilizing LE self-care protocols, appropriate compression garments/ devices, and needed level of caregiver assistance.   Baseline dependent for LE self care   Time 12   Period Weeks   Status On-going               Plan - 04/04/16 1125    Clinical Impression Statement Pt continues to tolerate all aspects of OT for CDT. Limb volume reductions remain slow but steady, despite minimal compliance with self wrapping between visits. Pt is utilizing sequential pump at home as perscribed, Pt is fully engaged during treatment sessions and  states leg pain/ discomfort is reduced overall. Cont as per POC. Check garment fit ASAP.   Rehab Potential Good   OT Frequency 3x / week   OT Duration 12 weeks   OT Treatment/Interventions Self-care/ADL training;Therapeutic exercise;Patient/family education;Manual Therapy;Manual lymph drainage;Other (comment);DME and/or AE instruction;Compression bandaging;Therapeutic activities  skin care      Patient will benefit from skilled therapeutic intervention in order to improve the following deficits and impairments:  Decreased skin integrity, Decreased activity tolerance, Decreased knowledge of use of DME, Impaired flexibility, Decreased balance, Decreased mobility, Difficulty walking, Obesity, Increased edema, Pain, Abnormal gait  Visit Diagnosis: Lymphedema, not elsewhere classified    Problem List There are no active problems to display for this patient.  Andrey Spearman, MS, OTR/L, [facility] 04/04/16 11:30 AM   [place] MAIN [facility] SERVICES [address] [place], [place], [address] Phone: [phone]   Fax:  [phone]  Name: Daylee Delahoz MRN: [id number] Date of Birth: July 22, 1975

## 2016-04-04 NOTE — Patient Instructions (Signed)
LE instructions and precautions as established- see initial eval.   

## 2016-04-06 ENCOUNTER — Ambulatory Visit: Payer: Managed Care, Other (non HMO) | Admitting: Occupational Therapy

## 2016-04-06 DIAGNOSIS — I890 Lymphedema, not elsewhere classified: Secondary | ICD-10-CM | POA: Diagnosis not present

## 2016-04-06 NOTE — Therapy (Signed)
[place] MAIN [facility] SERVICES [address] [place], [place], [address] Phone: [phone]   Fax:  [phone]  Occupational Therapy Treatment  Patient Details  Name: Danielle Lin MRN: [id number] Date of Birth: 17-Mar-1976 No Data Recorded  Encounter Date: 04/06/2016      OT End of Session - 04/06/16 1443    Visit Number 22   Number of Visits 36   Date for OT Re-Evaluation 05/06/16   OT Start Time 0800   OT Stop Time 0917   OT Time Calculation (min) 77 min      Past Medical History:  Diagnosis Date  . Hypertension     Past Surgical History:  Procedure Laterality Date  . CARPAL TUNNEL RELEASE Bilateral   . CHOLECYSTECTOMY    . GASTROPLASTY DUODENAL SWITCH      There were no vitals filed for this visit.      Subjective Assessment - 04/06/16 1442    Subjective  Pt presents for OT visit 22 to address moderate, Stage 3-4, BLE/BLQ Lipo-lymphedema. Pt tells me that DME vendor completed custom compression garment and HOS device measurements yesterday.   Patient is accompained by: Family member   Pertinent History SIPPS sx 04/25/15 w/ weight at 306 lbs. Current weight 212.6 lbs. HTN, Asthma, OSA ( uses CPAP) B CTR   Limitations BLE chronic leg pain and swelling, L>R, difficulty walking, decreased standing tolerance, decreased activity tolerance, difficulty w/ functional mobility/ transfers   Patient Stated Goals decrease leg swelling and pain and keep it from getting worse.   Currently in Pain? No/denies   Pain Onset More than a month ago             LYMPHEDEMA/ONCOLOGY QUESTIONNAIRE - 04/06/16 1444      Right Lower Extremity Lymphedema   Other RLE A-D =6775.38 ml. A-D increased 4.9% since last measured on 03/16/16. Decreased by 2.66 1% overall (since 10/16 )   Other RLE A-G =16059.03 ml. A-G Increased 6.54% since last measured on 03/16/16. A-G Overall Increase measurews 0.5% since initially measured on 02/13/16. )     Left  Lower Extremity Lymphedema   Other LLE A-D ( below the knee) limb volume=7275.42 ml, which is decreased by 5.33% since initially measured.   Other LLE A-G ( ankle to groin) limb volume= 16758.71 ml, which is decreased by 2.44% since initially measured on 02/13/16.                 OT Treatments/Exercises (OP) - 04/06/16 0001      ADLs   ADL Education Given Yes     Manual Therapy   Manual Therapy Edema management;Manual Lymphatic Drainage (MLD);Compression Bandaging   Edema Management skin care to LLE as established   Manual Lymphatic Drainage (MLD) MLD to LLE as established.   Compression Bandaging BLE wraped from ankle to groin as established.                 OT Education - 04/06/16 1443    Education provided Yes   Education Details Con tinued Equities trader Education  And LE ADL training throughout visit for lymphedema self care, including compression wrapping, compression garment and device wear/care, lymphatic pumping ther ex, simple self-MLD, and skin care. Discussed progress towards goals.   Person(s) Educated Patient   Methods Explanation;Demonstration   Comprehension Verbalized understanding;Need further instruction             OT Long Term Goals - 03/09/16 1051  OT LONG TERM GOAL #1   Title Lymphedema (LE) management/ self-care: Pt able to apply multi layered, gradient compression wraps with min caregiver assistance using proper techniques within 2 weeks to achieve optimal limb volume reduction.   Baseline dependent for LE self care   Time 2   Period Weeks   Status Not Met     OT LONG TERM GOAL #2   Title Lymphedema (LE) management/ self-care:  Pt to achieve at least 10% LLE limb volume reductions bilaterally during Intensive CDT to limit LE progression, decrease infection and falls risk, to reduce pain/, and to improve safe ambulation and functional mobility.   Baseline dependent for LE self care   Time 12   Period Weeks   Status  Partially Met     OT LONG TERM GOAL #3   Title Lymphedema (LE) management/ self-care:  Pt >/= 85 % compliant with all daily, LE self-care protocols for home program w/ needed level of caregiver assistance , including simple self-manual lymphatic drainage (MLD), skin care, lymphatic pumping the ex, skin care, and donning/ doffing compression wraps and garments o limit LE progression and further functional decline.     Baseline dependent for LE self care   Time 12   Period Weeks   Status Not Met     OT LONG TERM GOAL #4   Title Lymphedema (LE) management/ self-care:  Pt to tolerate daily compression wraps, garments and devices in keeping w/ prescribed wear regime within 1 week of issue date to progress and retain clinical and functional gains and to limit LE progression.   Baseline dependent for LE self care   Time 12   Period Weeks   Status Partially Met     OT LONG TERM GOAL #5   Title Lymphedema (LE) self-care:  During Management Phase CDT Pt to sustain limb volume reductions achieved during Intensive Phase CDT within 5% utilizing LE self-care protocols, appropriate compression garments/ devices, and needed level of caregiver assistance.   Baseline dependent for LE self care   Time 12   Period Weeks   Status On-going               Plan - 04/06/16 1453    Clinical Impression Statement BLE comparartive limb volumetrics revel increases in RLE iat both A-D and A-G  measurements. Pt agrees w/ encouragement to improve compliance with compression wraps between visits. LLE is decreased slightly. Once custom compression garments and devices are fitted   properly, we'll decrease treatment   frequency to follow up  and PRN as Pt transitions into Management Phase of CDT.   Rehab Potential Good   OT Frequency 3x / week   OT Duration 12 weeks   OT Treatment/Interventions Self-care/ADL training;Therapeutic exercise;Patient/family education;Manual Therapy;Manual lymph drainage;Other  (comment);DME and/or AE instruction;Compression bandaging;Therapeutic activities  skin care      Patient will benefit from skilled therapeutic intervention in order to improve the following deficits and impairments:  Decreased skin integrity, Decreased activity tolerance, Decreased knowledge of use of DME, Impaired flexibility, Decreased balance, Decreased mobility, Difficulty walking, Obesity, Increased edema, Pain, Abnormal gait  Visit Diagnosis: Lymphedema, not elsewhere classified    Problem List There are no active problems to display for this patient.  Danielle Spearman, MS, OTR/L, [facility] 04/06/16 2:56 PM  [place] MAIN [facility] SERVICES [address] [place], [place], [address] Phone: [phone]   Fax:  [phone]  Name: Danielle Lin MRN: [id number] Date of Birth: 1976-01-04

## 2016-04-09 ENCOUNTER — Ambulatory Visit: Payer: Managed Care, Other (non HMO) | Admitting: Occupational Therapy

## 2016-04-09 DIAGNOSIS — I890 Lymphedema, not elsewhere classified: Secondary | ICD-10-CM | POA: Diagnosis not present

## 2016-04-09 NOTE — Therapy (Signed)
[place] MAIN [facility] SERVICES [address] [place], [place], [address] Phone: [phone]   Fax:  [phone]  Occupational Therapy Treatment  Patient Details  Name: Danielle Lin MRN: [id number] Date of Birth: 01-09-1976 No Data Recorded  Encounter Date: 04/09/2016      OT End of Session - 04/09/16 0847    Visit Number 23   Number of Visits 36   Date for OT Re-Evaluation 05/06/16   OT Start Time 0805   OT Stop Time 0906   OT Time Calculation (min) 61 min      Past Medical History:  Diagnosis Date  . Hypertension     Past Surgical History:  Procedure Laterality Date  . CARPAL TUNNEL RELEASE Bilateral   . CHOLECYSTECTOMY    . GASTROPLASTY DUODENAL SWITCH      There were no vitals filed for this visit.      Subjective Assessment - 04/09/16 0855    Subjective  Pt presents for OT visit 23 to address moderate, Stage 3-4, BLE/BLQ Lipo-lymphedema. Pt attempted compression wrapping al weekend w/ good tolerance.    Patient is accompained by: Family member   Pertinent History SIPPS sx 04/25/15 w/ weight at 306 lbs. Current weight 212.6 lbs. HTN, Asthma, OSA ( uses CPAP) B CTR   Limitations BLE chronic leg pain and swelling, L>R, difficulty walking, decreased standing tolerance, decreased activity tolerance, difficulty w/ functional mobility/ transfers   Patient Stated Goals decrease leg swelling and pain and keep it from getting worse.   Currently in Pain? No/denies   Pain Onset More than a month ago                      OT Treatments/Exercises (OP) - 04/09/16 0001      ADLs   ADL Education Given Yes     Manual Therapy   Manual Therapy Edema management;Manual Lymphatic Drainage (MLD);Compression Bandaging   Edema Management skin care to LLE as established   Manual Lymphatic Drainage (MLD) MLD to LLE as established.   Compression Bandaging BLE wraped from ankle to groin as established.                  OT Education - 04/09/16 0904    Education provided Yes   Education Details Con tinued skilled Pt/caregiver Education  And LE ADL training throughout visit for lymphedema self care, including compression wrapping, compression garment and device wear/care, lymphatic pumping ther ex, simple self-MLD, and skin care. Discussed progress towards goals.   Person(s) Educated Patient   Methods Explanation   Comprehension Need further instruction             OT Long Term Goals - 03/09/16 1051      OT LONG TERM GOAL #1   Title Lymphedema (LE) management/ self-care: Pt able to apply multi layered, gradient compression wraps with min caregiver assistance using proper techniques within 2 weeks to achieve optimal limb volume reduction.   Baseline dependent for LE self care   Time 2   Period Weeks   Status Not Met     OT LONG TERM GOAL #2   Title Lymphedema (LE) management/ self-care:  Pt to achieve at least 10% LLE limb volume reductions bilaterally during Intensive CDT to limit LE progression, decrease infection and falls risk, to reduce pain/, and to improve safe ambulation and functional mobility.   Baseline dependent for LE self care   Time 12   Period Weeks  Status Partially Met     OT LONG TERM GOAL #3   Title Lymphedema (LE) management/ self-care:  Pt >/= 85 % compliant with all daily, LE self-care protocols for home program w/ needed level of caregiver assistance , including simple self-manual lymphatic drainage (MLD), skin care, lymphatic pumping the ex, skin care, and donning/ doffing compression wraps and garments o limit LE progression and further functional decline.     Baseline dependent for LE self care   Time 12   Period Weeks   Status Not Met     OT LONG TERM GOAL #4   Title Lymphedema (LE) management/ self-care:  Pt to tolerate daily compression wraps, garments and devices in keeping w/ prescribed wear regime within 1 week of issue date to progress and retain clinical and  functional gains and to limit LE progression.   Baseline dependent for LE self care   Time 12   Period Weeks   Status Partially Met     OT LONG TERM GOAL #5   Title Lymphedema (LE) self-care:  During Management Phase CDT Pt to sustain limb volume reductions achieved during Intensive Phase CDT within 5% utilizing LE self-care protocols, appropriate compression garments/ devices, and needed level of caregiver assistance.   Baseline dependent for LE self care   Time 12   Period Weeks   Status On-going               Plan - 04/09/16 1220    Clinical Impression Statement Pt demonstrates Improved compliance w/ LE self care home program as evidenced by applying compression wraps independently and wearing them full time over the weekend visit interval. Swelling reduction remains slow, as expected with Lipo-lymphedema, but Pt continues to build improved LE self care habits for long term LE management . Cont as per POC until custom day and HOS compressions garments/ devices are fitted.   Rehab Potential Good   OT Frequency 3x / week   OT Duration 12 weeks   OT Treatment/Interventions Self-care/ADL training;Therapeutic exercise;Patient/family education;Manual Therapy;Manual lymph drainage;Other (comment);DME and/or AE instruction;Compression bandaging;Therapeutic activities  skin care      Patient will benefit from skilled therapeutic intervention in order to improve the following deficits and impairments:  Decreased skin integrity, Decreased activity tolerance, Decreased knowledge of use of DME, Impaired flexibility, Decreased balance, Decreased mobility, Difficulty walking, Obesity, Increased edema, Pain, Abnormal gait  Visit Diagnosis: Lymphedema, not elsewhere classified    Problem List There are no active problems to display for this patient.   Andrey Spearman, MS, OTR/L, [facility] 04/09/16 12:25 PM  [place] MAIN [facility] SERVICES [address] [place], [place], [address] Phone: [phone]   Fax:  [phone]  Name: Danielle Lin MRN: [id number] Date of Birth: 28-Jun-1975

## 2016-04-09 NOTE — Patient Instructions (Signed)
LE instructions and precautions as established- see initial eval.   

## 2016-04-11 ENCOUNTER — Ambulatory Visit: Payer: Managed Care, Other (non HMO) | Admitting: Occupational Therapy

## 2016-04-11 DIAGNOSIS — I890 Lymphedema, not elsewhere classified: Secondary | ICD-10-CM | POA: Diagnosis not present

## 2016-04-11 NOTE — Therapy (Signed)
[place] MAIN [facility] SERVICES [address] [place], [place], [address] Phone: [phone]   Fax:  [phone]  Occupational Therapy Treatment  Patient Details  Name: Danielle Lin MRN: [id number] Date of Birth: 07-05-1975 No Data Recorded  Encounter Date: 04/11/2016      OT End of Session - 04/11/16 0907    Visit Number 24   Number of Visits 36   Date for OT Re-Evaluation 05/06/16   OT Start Time 0805   OT Stop Time 0907   OT Time Calculation (min) 62 min      Past Medical History:  Diagnosis Date  . Hypertension     Past Surgical History:  Procedure Laterality Date  . CARPAL TUNNEL RELEASE Bilateral   . CHOLECYSTECTOMY    . GASTROPLASTY DUODENAL SWITCH      There were no vitals filed for this visit.      Subjective Assessment - 04/11/16 0905    Subjective  Pt presents for OT visit 24 to address moderate, Stage 3-4, BLE/BLQ Lipo-lymphedema. Pt has no new complaints. She feels she is managing well.   Patient is accompained by: Family member   Pertinent History SIPPS sx 04/25/15 w/ weight at 306 lbs. Current weight 212.6 lbs. HTN, Asthma, OSA ( uses CPAP) B CTR   Limitations BLE chronic leg pain and swelling, L>R, difficulty walking, decreased standing tolerance, decreased activity tolerance, difficulty w/ functional mobility/ transfers   Patient Stated Goals decrease leg swelling and pain and keep it from getting worse.   Currently in Pain? No/denies   Pain Onset More than a month ago                              OT Education - 04/11/16 0906    Education provided Yes   Education Details Con tinued skilled Pt/caregiver Education  And LE ADL training throughout visit for lymphedema self care, including compression wrapping, compression garment and device wear/care, lymphatic pumping ther ex, simple self-MLD, and skin care. Discussed progress towards goals.   Person(s) Educated Patient   Methods  Explanation   Comprehension Verbalized understanding             OT Long Term Goals - 03/09/16 1051      OT LONG TERM GOAL #1   Title Lymphedema (LE) management/ self-care: Pt able to apply multi layered, gradient compression wraps with min caregiver assistance using proper techniques within 2 weeks to achieve optimal limb volume reduction.   Baseline dependent for LE self care   Time 2   Period Weeks   Status Not Met     OT LONG TERM GOAL #2   Title Lymphedema (LE) management/ self-care:  Pt to achieve at least 10% LLE limb volume reductions bilaterally during Intensive CDT to limit LE progression, decrease infection and falls risk, to reduce pain/, and to improve safe ambulation and functional mobility.   Baseline dependent for LE self care   Time 12   Period Weeks   Status Partially Met     OT LONG TERM GOAL #3   Title Lymphedema (LE) management/ self-care:  Pt >/= 85 % compliant with all daily, LE self-care protocols for home program w/ needed level of caregiver assistance , including simple self-manual lymphatic drainage (MLD), skin care, lymphatic pumping the ex, skin care, and donning/ doffing compression wraps and garments o limit LE progression and further functional decline.     Baseline  dependent for LE self care   Time 12   Period Weeks   Status Not Met     OT LONG TERM GOAL #4   Title Lymphedema (LE) management/ self-care:  Pt to tolerate daily compression wraps, garments and devices in keeping w/ prescribed wear regime within 1 week of issue date to progress and retain clinical and functional gains and to limit LE progression.   Baseline dependent for LE self care   Time 12   Period Weeks   Status Partially Met     OT LONG TERM GOAL #5   Title Lymphedema (LE) self-care:  During Management Phase CDT Pt to sustain limb volume reductions achieved during Intensive Phase CDT within 5% utilizing LE self-care protocols, appropriate compression garments/ devices, and  needed level of caregiver assistance.   Baseline dependent for LE self care   Time 12   Period Weeks   Status On-going               Plan - 04/11/16 1226    Clinical Impression Statement Pt tolerated LLE MLD, skin care and compression wrapping (BLE) without difficulty today. Tissue density continues to reduce bilaterally , but w/ L. Limb volume appears to be approaching clinical plateau. Cont as per POC. Cont to encourage compression between visits.   Rehab Potential Good   OT Frequency 3x / week   OT Duration 12 weeks   OT Treatment/Interventions Self-care/ADL training;Therapeutic exercise;Patient/family education;Manual Therapy;Manual lymph drainage;Other (comment);DME and/or AE instruction;Compression bandaging;Therapeutic activities  skin care      Patient will benefit from skilled therapeutic intervention in order to improve the following deficits and impairments:  Decreased skin integrity, Decreased activity tolerance, Decreased knowledge of use of DME, Impaired flexibility, Decreased balance, Decreased mobility, Difficulty walking, Obesity, Increased edema, Pain, Abnormal gait  Visit Diagnosis: Lymphedema, not elsewhere classified    Problem List There are no active problems to display for this patient.   .[place] MAIN [facility] SERVICES [address] [place], [place], [address] Phone: [phone]   Fax:  [phone]  Name: Danielle Lin MRN: [id number] Date of Birth: 01-17-1976

## 2016-04-13 ENCOUNTER — Ambulatory Visit: Payer: Managed Care, Other (non HMO) | Admitting: Occupational Therapy

## 2016-04-13 DIAGNOSIS — I890 Lymphedema, not elsewhere classified: Secondary | ICD-10-CM

## 2016-04-13 NOTE — Patient Instructions (Signed)
LE instructions and precautions as established- see initial eval.   

## 2016-04-13 NOTE — Therapy (Signed)
[place] MAIN [facility] SERVICES [address] [place], [place], [address] Phone: [phone]   Fax:  [phone]  Occupational Therapy Treatment  Patient Details  Name: Danielle Lin MRN: [id number] Date of Birth: 11/16/1975 No Data Recorded  Encounter Date: 04/13/2016      OT End of Session - 04/13/16 0850    Visit Number (P)  25   Number of Visits (P)  36   Date for OT Re-Evaluation (P)  05/06/16      Past Medical History:  Diagnosis Date  . Hypertension     Past Surgical History:  Procedure Laterality Date  . CARPAL TUNNEL RELEASE Bilateral   . CHOLECYSTECTOMY    . GASTROPLASTY DUODENAL SWITCH      There were no vitals filed for this visit.      Subjective Assessment - 04/13/16 0809    Subjective  Pt presents for OT visit 24 to address moderate, Stage 3-4, BLE/BLQ Lipo-lymphedema. Pt has no new complaints. DME vendor for flexitouch is meeting [place] today to doagnose device as Pt reports some chambers are not filling properly.   Patient is accompained by: Family member   Pertinent History SIPPS sx 04/25/15 w/ weight at 306 lbs. Current weight 212.6 lbs. HTN, Asthma, OSA ( uses CPAP) B CTR   Limitations BLE chronic leg pain and swelling, L>R, difficulty walking, decreased standing tolerance, decreased activity tolerance, difficulty w/ functional mobility/ transfers   Patient Stated Goals decrease leg swelling and pain and keep it from getting worse.   Currently in Pain? No/denies   Pain Onset More than a month ago                      OT Treatments/Exercises (OP) - 04/13/16 0001      ADLs   ADL Education Given Yes     Manual Therapy   Manual Therapy Edema management;Manual Lymphatic Drainage (MLD);Compression Bandaging   Edema Management skin care to LLE as established   Manual Lymphatic Drainage (MLD) MLD to LLE as established.   Compression Bandaging BLE wraped from ankle to groin as established.                  OT Education - 04/13/16 0810    Education provided (P)  Yes   Education Details (P)  Cont ADL training for LE self care throughout session   Person(s) Educated (P)  Patient   Methods (P)  Explanation   Comprehension (P)  Verbalized understanding             OT Long Term Goals - 03/09/16 1051      OT LONG TERM GOAL #1   Title Lymphedema (LE) management/ self-care: Pt able to apply multi layered, gradient compression wraps with min caregiver assistance using proper techniques within 2 weeks to achieve optimal limb volume reduction.   Baseline dependent for LE self care   Time 2   Period Weeks   Status Not Met     OT LONG TERM GOAL #2   Title Lymphedema (LE) management/ self-care:  Pt to achieve at least 10% LLE limb volume reductions bilaterally during Intensive CDT to limit LE progression, decrease infection and falls risk, to reduce pain/, and to improve safe ambulation and functional mobility.   Baseline dependent for LE self care   Time 12   Period Weeks   Status Partially Met     OT LONG TERM GOAL #3   Title Lymphedema (LE) management/  self-care:  Pt >/= 85 % compliant with all daily, LE self-care protocols for home program w/ needed level of caregiver assistance , including simple self-manual lymphatic drainage (MLD), skin care, lymphatic pumping the ex, skin care, and donning/ doffing compression wraps and garments o limit LE progression and further functional decline.     Baseline dependent for LE self care   Time 12   Period Weeks   Status Not Met     OT LONG TERM GOAL #4   Title Lymphedema (LE) management/ self-care:  Pt to tolerate daily compression wraps, garments and devices in keeping w/ prescribed wear regime within 1 week of issue date to progress and retain clinical and functional gains and to limit LE progression.   Baseline dependent for LE self care   Time 12   Period Weeks   Status Partially Met     OT LONG TERM GOAL #5   Title  Lymphedema (LE) self-care:  During Management Phase CDT Pt to sustain limb volume reductions achieved during Intensive Phase CDT within 5% utilizing LE self-care protocols, appropriate compression garments/ devices, and needed level of caregiver assistance.   Baseline dependent for LE self care   Time 12   Period Weeks   Status On-going               Plan - 04/13/16 0910    Clinical Impression Statement Pt continues to make progress towards all OT goals. Limb volumes are decreasing slowly in the LLE. RLE continues to respond. Pt tolerating treatment well. Cont as per POC.   Rehab Potential Good   OT Frequency 3x / week   OT Duration 12 weeks   OT Treatment/Interventions Self-care/ADL training;Therapeutic exercise;Patient/family education;Manual Therapy;Manual lymph drainage;Other (comment);DME and/or AE instruction;Compression bandaging;Therapeutic activities  skin care      Patient will benefit from skilled therapeutic intervention in order to improve the following deficits and impairments:  Decreased skin integrity, Decreased activity tolerance, Decreased knowledge of use of DME, Impaired flexibility, Decreased balance, Decreased mobility, Difficulty walking, Obesity, Increased edema, Pain, Abnormal gait  Visit Diagnosis: Lymphedema, not elsewhere classified    Problem List There are no active problems to display for this patient.    Andrey Spearman, MS, OTR/L, [facility] 04/13/16 9:17 AM  [place] MAIN [facility] SERVICES [place], [place], [address] Phone: [phone]   Fax:  [phone]  Name: Danielle Lin MRN: [id number] Date of Birth: 04-May-1975

## 2016-04-16 ENCOUNTER — Ambulatory Visit: Payer: Managed Care, Other (non HMO) | Admitting: Occupational Therapy

## 2016-04-16 DIAGNOSIS — I890 Lymphedema, not elsewhere classified: Secondary | ICD-10-CM

## 2016-04-16 NOTE — Therapy (Signed)
[place] MAIN [facility] SERVICES [address] [place], [place], [address] Phone: [phone]   Fax:  [phone]  Occupational Therapy Treatment  Patient Details  Name: Danielle Lin MRN: [id number] Date of Birth: 1975-06-15 No Data Recorded  Encounter Date: 04/16/2016      OT End of Session - 04/16/16 1041    Visit Number 26   Number of Visits 36   Date for OT Re-Evaluation 05/06/16   OT Start Time 0802   OT Stop Time 0905   OT Time Calculation (min) 63 min      Past Medical History:  Diagnosis Date  . Hypertension     Past Surgical History:  Procedure Laterality Date  . CARPAL TUNNEL RELEASE Bilateral   . CHOLECYSTECTOMY    . GASTROPLASTY DUODENAL SWITCH      There were no vitals filed for this visit.      Subjective Assessment - 04/16/16 0808    Subjective  Pt presents for OT visit 25 to address moderate, Stage 3-4, BLE/BLQ Lipo-lymphedema. Pt reports she wrapped both legs to the knee over the weekend.   Patient is accompained by: Family member   Pertinent History SIPPS sx 04/25/15 w/ weight at 306 lbs. Current weight 212.6 lbs. HTN, Asthma, OSA ( uses CPAP) B CTR   Limitations BLE chronic leg pain and swelling, L>R, difficulty walking, decreased standing tolerance, decreased activity tolerance, difficulty w/ functional mobility/ transfers   Patient Stated Goals decrease leg swelling and pain and keep it from getting worse.   Currently in Pain? No/denies   Pain Onset More than a month ago                      OT Treatments/Exercises (OP) - 04/16/16 0001      ADLs   ADL Education Given Yes     Manual Therapy   Manual Therapy Edema management;Manual Lymphatic Drainage (MLD);Compression Bandaging   Edema Management skin care to LLE as established   Manual Lymphatic Drainage (MLD) MLD to LLE as established.   Compression Bandaging BLE wraped from ankle to groin as established.                  OT Education - 04/16/16 0850    Education provided Yes   Education Details Cont LE self care edu throughout session   Person(s) Educated Patient   Methods Explanation   Comprehension Verbalized understanding             OT Long Term Goals - 03/09/16 1051      OT LONG TERM GOAL #1   Title Lymphedema (LE) management/ self-care: Pt able to apply multi layered, gradient compression wraps with min caregiver assistance using proper techniques within 2 weeks to achieve optimal limb volume reduction.   Baseline dependent for LE self care   Time 2   Period Weeks   Status Not Met     OT LONG TERM GOAL #2   Title Lymphedema (LE) management/ self-care:  Pt to achieve at least 10% LLE limb volume reductions bilaterally during Intensive CDT to limit LE progression, decrease infection and falls risk, to reduce pain/, and to improve safe ambulation and functional mobility.   Baseline dependent for LE self care   Time 12   Period Weeks   Status Partially Met     OT LONG TERM GOAL #3   Title Lymphedema (LE) management/ self-care:  Pt >/= 85 % compliant with all daily, LE  self-care protocols for home program w/ needed level of caregiver assistance , including simple self-manual lymphatic drainage (MLD), skin care, lymphatic pumping the ex, skin care, and donning/ doffing compression wraps and garments o limit LE progression and further functional decline.     Baseline dependent for LE self care   Time 12   Period Weeks   Status Not Met     OT LONG TERM GOAL #4   Title Lymphedema (LE) management/ self-care:  Pt to tolerate daily compression wraps, garments and devices in keeping w/ prescribed wear regime within 1 week of issue date to progress and retain clinical and functional gains and to limit LE progression.   Baseline dependent for LE self care   Time 12   Period Weeks   Status Partially Met     OT LONG TERM GOAL #5   Title Lymphedema (LE) self-care:  During Management Phase CDT Pt to  sustain limb volume reductions achieved during Intensive Phase CDT within 5% utilizing LE self-care protocols, appropriate compression garments/ devices, and needed level of caregiver assistance.   Baseline dependent for LE self care   Time 12   Period Weeks   Status On-going               Plan - 04/16/16 1041    Clinical Impression Statement Compliance with self wrapping continues to improve during OT intervals. LLE swelling is very stubborn and has been slow to respond, despite diligent clinical care. Cont as per POC. Pt understand garment fitting process after review today.   Rehab Potential Good   OT Frequency 3x / week   OT Duration 12 weeks   OT Treatment/Interventions Self-care/ADL training;Therapeutic exercise;Patient/family education;Manual Therapy;Manual lymph drainage;Other (comment);DME and/or AE instruction;Compression bandaging;Therapeutic activities  skin care      Patient will benefit from skilled therapeutic intervention in order to improve the following deficits and impairments:  Decreased skin integrity, Decreased activity tolerance, Decreased knowledge of use of DME, Impaired flexibility, Decreased balance, Decreased mobility, Difficulty walking, Obesity, Increased edema, Pain, Abnormal gait  Visit Diagnosis: Lymphedema, not elsewhere classified    Problem List There are no active problems to display for this patient.   Andrey Spearman, MS, OTR/L, [facility] 04/16/16 10:45 AM   [place] MAIN [facility] SERVICES [place], [place], [address] Phone: [phone]   Fax:  [phone]  Name: Danielle Lin MRN: [id number] Date of Birth: 1975/08/30

## 2016-04-18 ENCOUNTER — Ambulatory Visit: Payer: Managed Care, Other (non HMO) | Admitting: Occupational Therapy

## 2016-04-18 DIAGNOSIS — I890 Lymphedema, not elsewhere classified: Secondary | ICD-10-CM | POA: Diagnosis not present

## 2016-04-18 NOTE — Patient Instructions (Signed)
LE instructions and precautions as established- see initial eval.   

## 2016-04-18 NOTE — Therapy (Signed)
[place] MAIN [facility] SERVICES [address] [place], [place], [address] Phone: [phone]   Fax:  [phone]  Occupational Therapy Treatment  Patient Details  Name: Danielle Lin MRN: [id number] Date of Birth: January 22, 1976 No Data Recorded  Encounter Date: 04/18/2016      OT End of Session - 04/18/16 1221    Visit Number 27   Number of Visits 36   Date for OT Re-Evaluation 05/06/16   OT Start Time 0810   OT Stop Time 0909   OT Time Calculation (min) 59 min      Past Medical History:  Diagnosis Date  . Hypertension     Past Surgical History:  Procedure Laterality Date  . CARPAL TUNNEL RELEASE Bilateral   . CHOLECYSTECTOMY    . GASTROPLASTY DUODENAL SWITCH      There were no vitals filed for this visit.      Subjective Assessment - 04/18/16 0812    Subjective  Pt presents for OT visit 25 to address moderate, Stage 3-4, BLE/BLQ Lipo-lymphedema. Pt  has no new complaints today.   Patient is accompained by: Family member   Pertinent History SIPPS sx 04/25/15 w/ weight at 306 lbs. Current weight 212.6 lbs. HTN, Asthma, OSA ( uses CPAP) B CTR   Limitations BLE chronic leg pain and swelling, L>R, difficulty walking, decreased standing tolerance, decreased activity tolerance, difficulty w/ functional mobility/ transfers   Patient Stated Goals decrease leg swelling and pain and keep it from getting worse.   Currently in Pain? No/denies   Pain Onset More than a month ago                      OT Treatments/Exercises (OP) - 04/18/16 0001      Manual Therapy   Manual Therapy Edema management;Manual Lymphatic Drainage (MLD);Compression Bandaging   Edema Management skin care to LLE as established   Manual Lymphatic Drainage (MLD) MLD to LLE as established.   Compression Bandaging BLE wraped from ankle to groin as established.                 OT Education - 04/18/16 0900    Education provided No              OT Long Term Goals - 03/09/16 1051      OT LONG TERM GOAL #1   Title Lymphedema (LE) management/ self-care: Pt able to apply multi layered, gradient compression wraps with min caregiver assistance using proper techniques within 2 weeks to achieve optimal limb volume reduction.   Baseline dependent for LE self care   Time 2   Period Weeks   Status Not Met     OT LONG TERM GOAL #2   Title Lymphedema (LE) management/ self-care:  Pt to achieve at least 10% LLE limb volume reductions bilaterally during Intensive CDT to limit LE progression, decrease infection and falls risk, to reduce pain/, and to improve safe ambulation and functional mobility.   Baseline dependent for LE self care   Time 12   Period Weeks   Status Partially Met     OT LONG TERM GOAL #3   Title Lymphedema (LE) management/ self-care:  Pt >/= 85 % compliant with all daily, LE self-care protocols for home program w/ needed level of caregiver assistance , including simple self-manual lymphatic drainage (MLD), skin care, lymphatic pumping the ex, skin care, and donning/ doffing compression wraps and garments o limit LE progression and further functional decline.  Baseline dependent for LE self care   Time 12   Period Weeks   Status Not Met     OT LONG TERM GOAL #4   Title Lymphedema (LE) management/ self-care:  Pt to tolerate daily compression wraps, garments and devices in keeping w/ prescribed wear regime within 1 week of issue date to progress and retain clinical and functional gains and to limit LE progression.   Baseline dependent for LE self care   Time 12   Period Weeks   Status Partially Met     OT LONG TERM GOAL #5   Title Lymphedema (LE) self-care:  During Management Phase CDT Pt to sustain limb volume reductions achieved during Intensive Phase CDT within 5% utilizing LE self-care protocols, appropriate compression garments/ devices, and needed level of caregiver assistance.   Baseline dependent  for LE self care   Time 12   Period Weeks   Status On-going               Plan - 04/18/16 0907    Clinical Impression Statement Pt continues to demonstrate progress towards all OT goals. Limb volumes are reducing slowly and appear to be approaching clinical plateau on L. Pt continues to demonstrate more consistent LE self care during visit intervals as evidence of increased habituation to slef care regimes. Cont as per POC. Garments have been ordered. Fitting pending.   Rehab Potential Good   OT Frequency 3x / week   OT Duration 12 weeks   OT Treatment/Interventions Self-care/ADL training;Therapeutic exercise;Patient/family education;Manual Therapy;Manual lymph drainage;Other (comment);DME and/or AE instruction;Compression bandaging;Therapeutic activities  skin care      Patient will benefit from skilled therapeutic intervention in order to improve the following deficits and impairments:  Decreased skin integrity, Decreased activity tolerance, Decreased knowledge of use of DME, Impaired flexibility, Decreased balance, Decreased mobility, Difficulty walking, Obesity, Increased edema, Pain, Abnormal gait  Visit Diagnosis: Lymphedema, not elsewhere classified    Problem List There are no active problems to display for this patient.   Andrey Spearman, MS, OTR/L, [facility] 04/18/16 12:23 PM  [place] MAIN [facility] SERVICES [address] [place], [place], [address] Phone: [phone]   Fax:  [phone]  Name: Natacha Jepsen MRN: [id number] Date of Birth: 1976-01-28

## 2016-04-20 ENCOUNTER — Ambulatory Visit: Payer: Managed Care, Other (non HMO) | Admitting: Occupational Therapy

## 2016-04-20 DIAGNOSIS — I890 Lymphedema, not elsewhere classified: Secondary | ICD-10-CM

## 2016-04-20 NOTE — Therapy (Signed)
[place] MAIN [facility] SERVICES [address] [place], [place], [address] Phone: [phone]   Fax:  [phone]  Occupational Therapy Treatment  Patient Details  Name: Danielle Lin MRN: [id number] Date of Birth: [birth year] No Data Recorded  Encounter Date: 04/20/2016      OT End of Session - 04/20/16 1220    Visit Number 28   Number of Visits 36   Date for OT Re-Evaluation 05/06/16   OT Start Time 0805   OT Stop Time 0906   OT Time Calculation (min) 61 min      Past Medical History:  Diagnosis Date  . Hypertension     Past Surgical History:  Procedure Laterality Date  . CARPAL TUNNEL RELEASE Bilateral   . CHOLECYSTECTOMY    . GASTROPLASTY DUODENAL SWITCH      There were no vitals filed for this visit.      Subjective Assessment - 04/20/16 1218    Subjective  Pt presents for OT visit 28 to address moderate, Stage 3-4, BLE/BLQ Lipo-lymphedema.Pt requests wrapping to knoee only today due to menstrual discomfort.   Patient is accompained by: Family member   Pertinent History SIPPS sx 04/25/15 w/ weight at 306 lbs. Current weight 212.6 lbs. HTN, Asthma, OSA ( uses CPAP) B CTR   Limitations BLE chronic leg pain and swelling, L>R, difficulty walking, decreased standing tolerance, decreased activity tolerance, difficulty w/ functional mobility/ transfers   Patient Stated Goals decrease leg swelling and pain and keep it from getting worse.   Currently in Pain? Yes  not rated   Pain Onset More than a month ago                      OT Treatments/Exercises (OP) - 04/20/16 0001      ADLs   ADL Education Given Yes     Manual Therapy   Manual Therapy Edema management;Manual Lymphatic Drainage (MLD);Compression Bandaging   Edema Management skin care to LLE as established   Manual Lymphatic Drainage (MLD) MLD to LLE as established.   Compression Bandaging BLE wraped from ankle to groin as established.                  OT Education - 04/20/16 1217    Education provided Yes   Education Details Con tinued skilled Pt/caregiver Education  And LE ADL training throughout visit for lymphedema self care, including compression wrapping, compression garment and device wear/care, lymphatic pumping ther ex, simple self-MLD, and skin care. Discussed progress towards goals.   Person(s) Educated Patient   Methods Explanation;Demonstration   Comprehension Verbalized understanding             OT Long Term Goals - 03/09/16 1051      OT LONG TERM GOAL #1   Title Lymphedema (LE) management/ self-care: Pt able to apply multi layered, gradient compression wraps with min caregiver assistance using proper techniques within 2 weeks to achieve optimal limb volume reduction.   Baseline dependent for LE self care   Time 2   Period Weeks   Status Not Met     OT LONG TERM GOAL #2   Title Lymphedema (LE) management/ self-care:  Pt to achieve at least 10% LLE limb volume reductions bilaterally during Intensive CDT to limit LE progression, decrease infection and falls risk, to reduce pain/, and to improve safe ambulation and functional mobility.   Baseline dependent for LE self care   Time 12   Period Weeks  Status Partially Met     OT LONG TERM GOAL #3   Title Lymphedema (LE) management/ self-care:  Pt >/= 85 % compliant with all daily, LE self-care protocols for home program w/ needed level of caregiver assistance , including simple self-manual lymphatic drainage (MLD), skin care, lymphatic pumping the ex, skin care, and donning/ doffing compression wraps and garments o limit LE progression and further functional decline.     Baseline dependent for LE self care   Time 12   Period Weeks   Status Not Met     OT LONG TERM GOAL #4   Title Lymphedema (LE) management/ self-care:  Pt to tolerate daily compression wraps, garments and devices in keeping w/ prescribed wear regime within 1 week of issue date  to progress and retain clinical and functional gains and to limit LE progression.   Baseline dependent for LE self care   Time 12   Period Weeks   Status Partially Met     OT LONG TERM GOAL #5   Title Lymphedema (LE) self-care:  During Management Phase CDT Pt to sustain limb volume reductions achieved during Intensive Phase CDT within 5% utilizing LE self-care protocols, appropriate compression garments/ devices, and needed level of caregiver assistance.   Baseline dependent for LE self care   Time 12   Period Weeks   Status On-going               Plan - 04/20/16 1222    Clinical Impression Statement Pt feeling unwell today 2/2 mentrual cramps so wrapped to knees only. Pt tolerated compression wraps and MLD as well without difficulty today. Skin inexcellent condition despite long term bilateral compression wrapping. Cont as per POC. Awaiting garment from DME vendor.   Rehab Potential Good   OT Frequency 3x / week   OT Duration 12 weeks   OT Treatment/Interventions Self-care/ADL training;Therapeutic exercise;Patient/family education;Manual Therapy;Manual lymph drainage;Other (comment);DME and/or AE instruction;Compression bandaging;Therapeutic activities  skin care      Patient will benefit from skilled therapeutic intervention in order to improve the following deficits and impairments:  Decreased skin integrity, Decreased activity tolerance, Decreased knowledge of use of DME, Impaired flexibility, Decreased balance, Decreased mobility, Difficulty walking, Obesity, Increased edema, Pain, Abnormal gait  Visit Diagnosis: Lymphedema, not elsewhere classified    Problem List There are no active problems to display for this patient.   Andrey Spearman, MS, OTR/L, [facility] 04/20/16 12:24 PM  [place] MAIN [facility] SERVICES [address] [place], [place], [address] Phone: [phone]   Fax:  [phone]  Name: Danielle Lin MRN:  [id number] Date of Birth: 1975-05-06

## 2016-04-25 ENCOUNTER — Ambulatory Visit: Payer: Managed Care, Other (non HMO) | Admitting: Occupational Therapy

## 2016-04-25 DIAGNOSIS — I890 Lymphedema, not elsewhere classified: Secondary | ICD-10-CM

## 2016-04-25 NOTE — Therapy (Signed)
[place] MAIN [facility] SERVICES [address] [place], [place], [address] Phone: [phone]   Fax:  [phone]  Occupational Therapy Treatment  Patient Details  Name: Danielle Lin MRN: [id number] Date of Birth: 08-27-1975 No Data Recorded  Encounter Date: 04/25/2016      OT End of Session - 04/25/16 0904    Visit Number 29   Number of Visits 36   Date for OT Re-Evaluation 05/06/16   OT Start Time 0802   OT Stop Time 0904   OT Time Calculation (min) 62 min      Past Medical History:  Diagnosis Date  . Hypertension     Past Surgical History:  Procedure Laterality Date  . CARPAL TUNNEL RELEASE Bilateral   . CHOLECYSTECTOMY    . GASTROPLASTY DUODENAL SWITCH      There were no vitals filed for this visit.      Subjective Assessment - 04/25/16 0848    Subjective  Pt presents for OT visit 29 to address moderate, Stage 3-4, BLE/BLQ Lipo-lymphedema. Pt reports she managed LE over holidays without difficulty. "I only wrapped below the knee."   Patient is accompained by: Family member   Pertinent History SIPPS sx 04/25/15 w/ weight at 306 lbs. Current weight 212.6 lbs. HTN, Asthma, OSA ( uses CPAP) B CTR   Limitations BLE chronic leg pain and swelling, L>R, difficulty walking, decreased standing tolerance, decreased activity tolerance, difficulty w/ functional mobility/ transfers   Patient Stated Goals decrease leg swelling and pain and keep it from getting worse.   Currently in Pain? No/denies   Pain Onset More than a month ago                              OT Education - 04/25/16 0903    Education provided No             OT Long Term Goals - 03/09/16 1051      OT LONG TERM GOAL #1   Title Lymphedema (LE) management/ self-care: Pt able to apply multi layered, gradient compression wraps with min caregiver assistance using proper techniques within 2 weeks to achieve optimal limb volume reduction.   Baseline dependent for LE self care   Time 2   Period Weeks   Status Not Met     OT LONG TERM GOAL #2   Title Lymphedema (LE) management/ self-care:  Pt to achieve at least 10% LLE limb volume reductions bilaterally during Intensive CDT to limit LE progression, decrease infection and falls risk, to reduce pain/, and to improve safe ambulation and functional mobility.   Baseline dependent for LE self care   Time 12   Period Weeks   Status Partially Met     OT LONG TERM GOAL #3   Title Lymphedema (LE) management/ self-care:  Pt >/= 85 % compliant with all daily, LE self-care protocols for home program w/ needed level of caregiver assistance , including simple self-manual lymphatic drainage (MLD), skin care, lymphatic pumping the ex, skin care, and donning/ doffing compression wraps and garments o limit LE progression and further functional decline.     Baseline dependent for LE self care   Time 12   Period Weeks   Status Not Met     OT LONG TERM GOAL #4   Title Lymphedema (LE) management/ self-care:  Pt to tolerate daily compression wraps, garments and devices in keeping w/ prescribed wear regime within 1  week of issue date to progress and retain clinical and functional gains and to limit LE progression.   Baseline dependent for LE self care   Time 12   Period Weeks   Status Partially Met     OT LONG TERM GOAL #5   Title Lymphedema (LE) self-care:  During Management Phase CDT Pt to sustain limb volume reductions achieved during Intensive Phase CDT within 5% utilizing LE self-care protocols, appropriate compression garments/ devices, and needed level of caregiver assistance.   Baseline dependent for LE self care   Time 12   Period Weeks   Status On-going               Plan - 04/25/16 0913    Clinical Impression Statement LLE less palpably congested today and Pt less uncomfortable overall. Pt able to apply knee length wraps daily over the holiday break. It remains difficult for  her to apply wraps from fot to groin as functional reach and repositioning are limited significantly by body habitus.  Overall , limb volue reductions bilaterally have been slow, but are within goal range. and beter than anticipated as typically lipo-lymphedema does not always respond to CDT in  a noticable way. Pt continues to make progress towards all goals and sensory discomfort continues to decrease as well.   Rehab Potential Good   OT Frequency 3x / week   OT Duration 12 weeks   OT Treatment/Interventions Self-care/ADL training;Therapeutic exercise;Patient/family education;Manual Therapy;Manual lymph drainage;Other (comment);DME and/or AE instruction;Compression bandaging;Therapeutic activities  skin care      Patient will benefit from skilled therapeutic intervention in order to improve the following deficits and impairments:  Decreased skin integrity, Decreased activity tolerance, Decreased knowledge of use of DME, Impaired flexibility, Decreased balance, Decreased mobility, Difficulty walking, Obesity, Increased edema, Pain, Abnormal gait  Visit Diagnosis: Lymphedema, not elsewhere classified    Problem List There are no active problems to display for this patient.   Andrey Spearman, MS, OTR/L, [facility] 04/25/16 10:24 AM  [place] MAIN [facility] SERVICES [address] [place], [place], [address] Phone: [phone]   Fax:  [phone]  Name: Danielle Lin MRN: [id number] Date of Birth: March 27, 1976

## 2016-04-27 ENCOUNTER — Ambulatory Visit: Payer: Managed Care, Other (non HMO) | Admitting: Occupational Therapy

## 2016-04-27 DIAGNOSIS — I890 Lymphedema, not elsewhere classified: Secondary | ICD-10-CM | POA: Diagnosis not present

## 2016-04-27 NOTE — Patient Instructions (Signed)
LE instructions and precautions as established- see initial eval.   

## 2016-04-27 NOTE — Therapy (Signed)
[place] MAIN [facility] SERVICES [address] [place], [place], [address] Phone: [phone]   Fax:  [phone]  Occupational Therapy Treatment & Progress Note  Patient Details  Name: Danielle Lin MRN: [id number] Date of Birth: [birth year] No Data Recorded  Encounter Date: 04/27/2016      OT End of Session - 04/27/16 0849    Visit Number 30   Number of Visits 36   Date for OT Re-Evaluation 05/06/16   OT Start Time 0810   OT Stop Time 0915   OT Time Calculation (min) 65 min      Past Medical History:  Diagnosis Date  . Hypertension     Past Surgical History:  Procedure Laterality Date  . CARPAL TUNNEL RELEASE Bilateral   . CHOLECYSTECTOMY    . GASTROPLASTY DUODENAL SWITCH      There were no vitals filed for this visit.      Subjective Assessment - 04/27/16 0810    Subjective  Pt presents for OT visit 30 to address moderate, Stage 3-4, BLE/BLQ Lipo-lymphedema. Pt has no new complaints today.   Patient is accompained by: Family member   Pertinent History SIPPS sx 04/25/15 w/ weight at 306 lbs. Current weight 212.6 lbs. HTN, Asthma, OSA ( uses CPAP) B CTR   Limitations BLE chronic leg pain and swelling, L>R, difficulty walking, decreased standing tolerance, decreased activity tolerance, difficulty w/ functional mobility/ transfers   Patient Stated Goals decrease leg swelling and pain and keep it from getting worse.   Currently in Pain? No/denies   Pain Onset More than a month ago                      OT Treatments/Exercises (OP) - 04/27/16 0001      ADLs   ADL Education Given Yes     Manual Therapy   Manual Therapy Edema management;Manual Lymphatic Drainage (MLD);Compression Bandaging   Edema Management skin care to LLE as established   Manual Lymphatic Drainage (MLD) MLD to LLE as established.   Compression Bandaging BLE wraped from ankle to groin as established.                 OT Education -  04/27/16 0847    Education Details Con tinued skilled Pt/caregiver Education  And LE ADL training throughout visit for lymphedema self care, including compression wrapping, compression garment and device wear/care, lymphatic pumping ther ex, simple self-MLD, and skin care. Discussed progress towards goals.   Person(s) Educated Patient   Methods Explanation   Comprehension Verbalized understanding             OT Long Term Goals - 04/27/16 1102      OT LONG TERM GOAL #1   Title Lymphedema (LE) management/ self-care: Pt able to apply multi layered, gradient compression wraps with min caregiver assistance using proper techniques within 2 weeks to achieve optimal limb volume reduction.   Baseline dependent for LE self care   Time 2   Status Partially Met     OT LONG TERM GOAL #2   Title Lymphedema (LE) management/ self-care:  Pt to achieve at least 10% LLE limb volume reductions bilaterally during Intensive CDT to limit LE progression, decrease infection and falls risk, to reduce pain/, and to improve safe ambulation and functional mobility.   Baseline dependent for LE self care   Time 12   Period Weeks   Status Partially Met     OT LONG TERM GOAL #  3   Title Lymphedema (LE) management/ self-care:  Pt >/= 85 % compliant with all daily, LE self-care protocols for home program w/ needed level of caregiver assistance , including simple self-manual lymphatic drainage (MLD), skin care, lymphatic pumping the ex, skin care, and donning/ doffing compression wraps and garments o limit LE progression and further functional decline.     Baseline dependent for LE self care   Time 12   Period Weeks   Status Partially Met     OT LONG TERM GOAL #4   Title Lymphedema (LE) management/ self-care:  Pt to tolerate daily compression wraps, garments and devices in keeping w/ prescribed wear regime within 1 week of issue date to progress and retain clinical and functional gains and to limit LE progression.    Baseline dependent for LE self care   Time 12   Period Weeks   Status On-going     OT LONG TERM GOAL #5   Title Lymphedema (LE) self-care:  During Management Phase CDT Pt to sustain limb volume reductions achieved during Intensive Phase CDT within 5% utilizing LE self-care protocols, appropriate compression garments/ devices, and needed level of caregiver assistance.   Baseline dependent for LE self care   Time 6   Period Months               Plan - 04/27/16 1059    Clinical Impression Statement Pt tolerated all CDT modalities without difficulty today, including BLE MLD, skin care and A-G compression wrapping. Pt continues to make progress towards all goals. Garments have been ordered and we are awaiting fitting before transitioning to Management Phase CDT. Cont as per POC.   Rehab Potential Good   OT Frequency 3x / week   OT Duration 12 weeks   OT Treatment/Interventions Self-care/ADL training;Therapeutic exercise;Patient/family education;Manual Therapy;Manual lymph drainage;Other (comment);DME and/or AE instruction;Compression bandaging;Therapeutic activities  skin care      Patient will benefit from skilled therapeutic intervention in order to improve the following deficits and impairments:  Decreased skin integrity, Decreased activity tolerance, Decreased knowledge of use of DME, Impaired flexibility, Decreased balance, Decreased mobility, Difficulty walking, Obesity, Increased edema, Pain, Abnormal gait  Visit Diagnosis: Lymphedema, not elsewhere classified    Problem List There are no active problems to display for this patient.   Andrey Spearman, MS, OTR/L, [facility] 04/27/16 11:05 AM  [place] MAIN [facility] SERVICES [place], [place], [address] Phone: [phone]   Fax:  [phone]  Name: Danielle Lin MRN: [id number] Date of Birth: 04/03/1976

## 2016-05-03 ENCOUNTER — Ambulatory Visit: Payer: Managed Care, Other (non HMO) | Admitting: Occupational Therapy

## 2016-05-04 ENCOUNTER — Ambulatory Visit: Payer: Managed Care, Other (non HMO) | Attending: Family Medicine | Admitting: Occupational Therapy

## 2016-05-04 DIAGNOSIS — I890 Lymphedema, not elsewhere classified: Secondary | ICD-10-CM | POA: Insufficient documentation

## 2016-05-07 IMAGING — US US ABDOMEN LIMITED
1 series · 14 of 25 positions shown · non-contrast
Comparison: None in PACs

CLINICAL DATA: Morbid obesity, pre bariatric evaluation

EXAM:
US ABDOMEN LIMITED - RIGHT UPPER QUADRANT

[Series 1: us abdomen limited · 0.21mm/px · 14 of 38 slices shown]
[im 1/38]
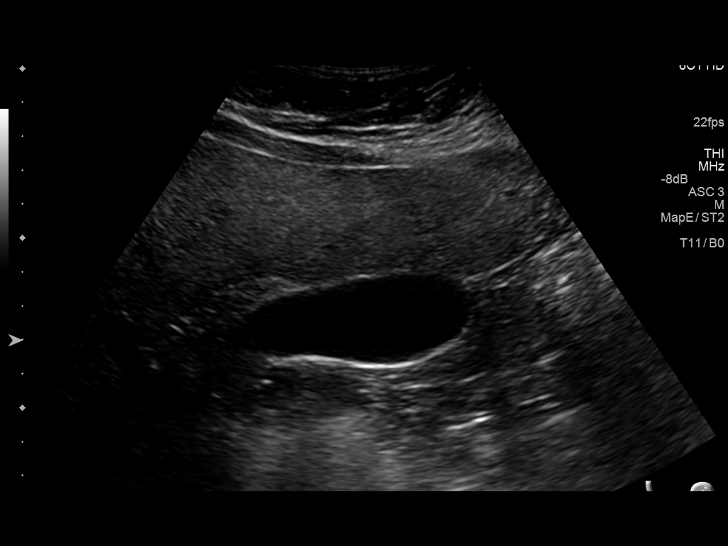
[im 4/38]
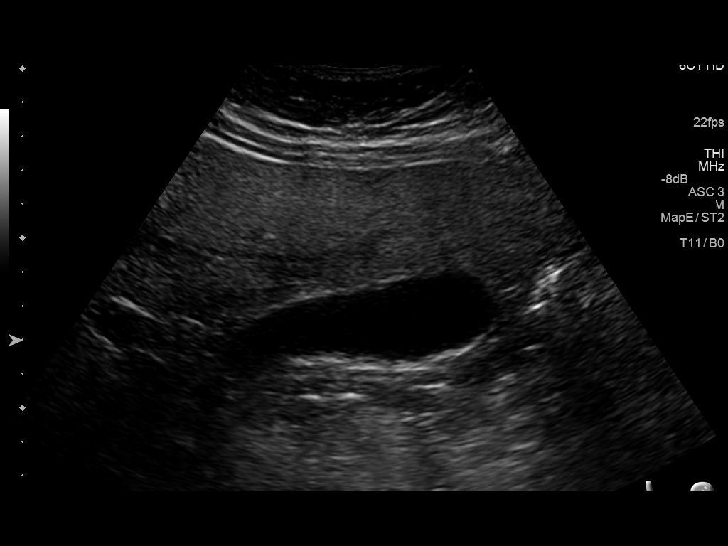
[im 7/38]
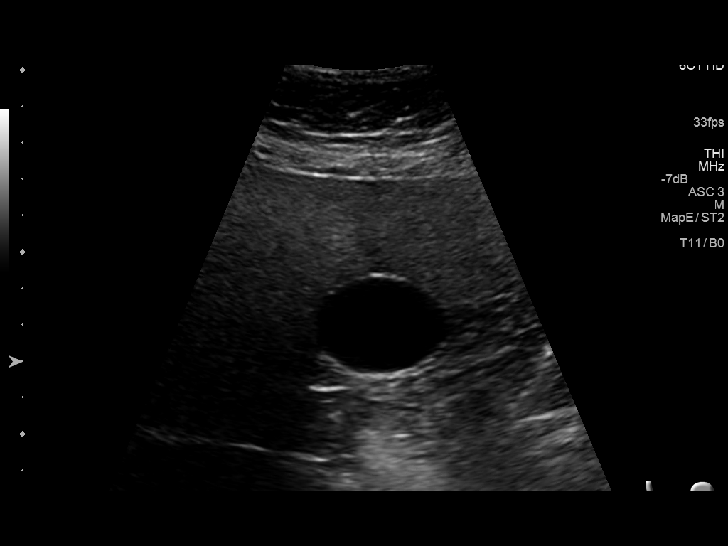
[im 10/38]
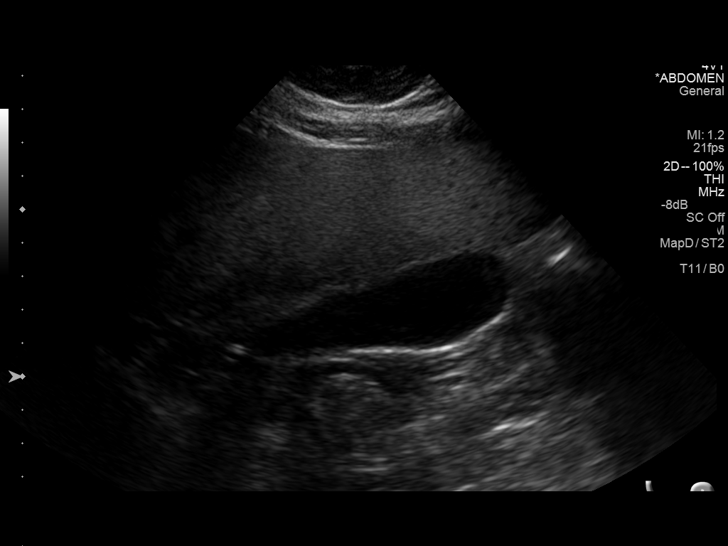
[im 13/38]
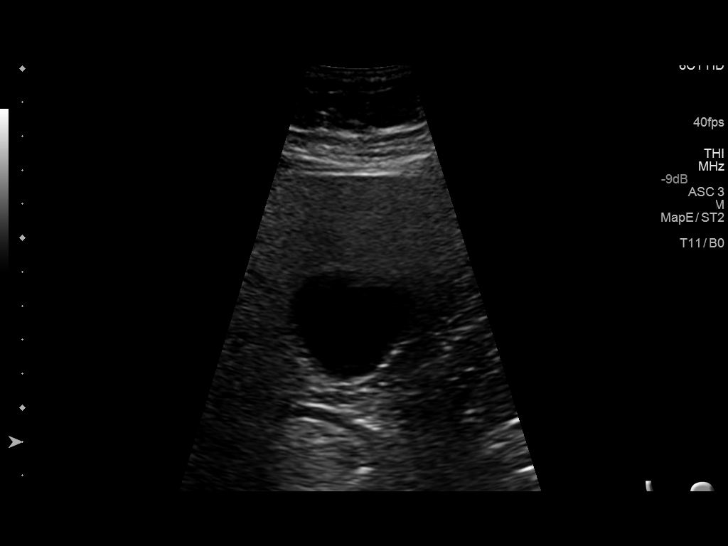
[im 14/38]
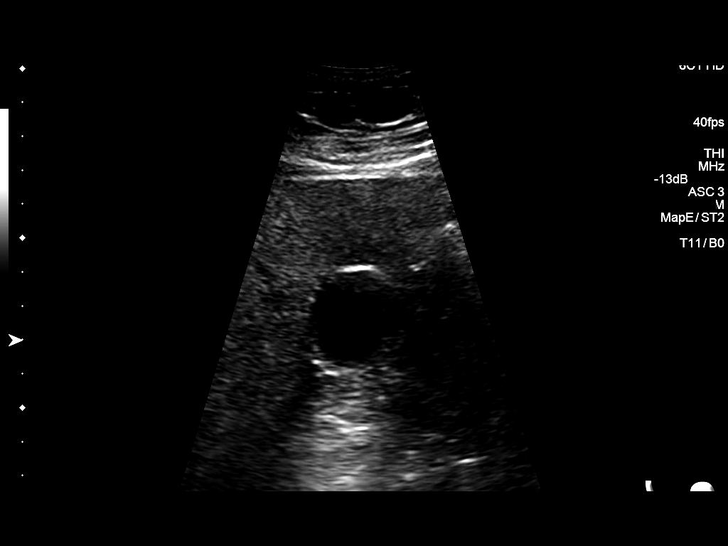
[im 17/38]
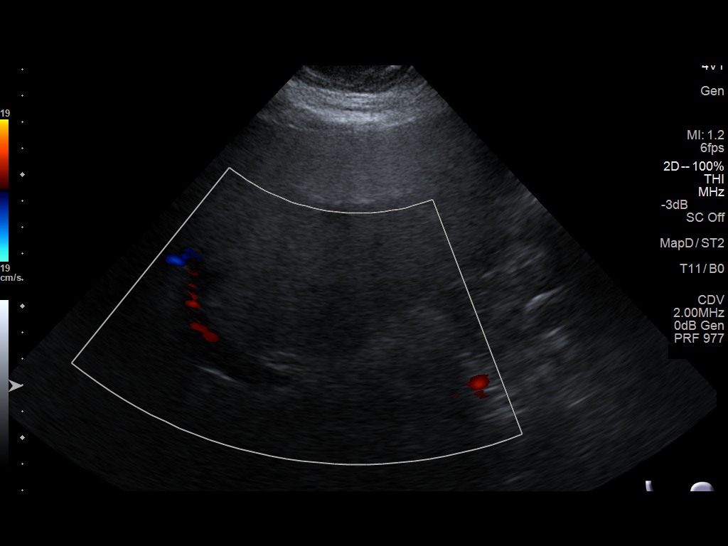
[im 21/38]
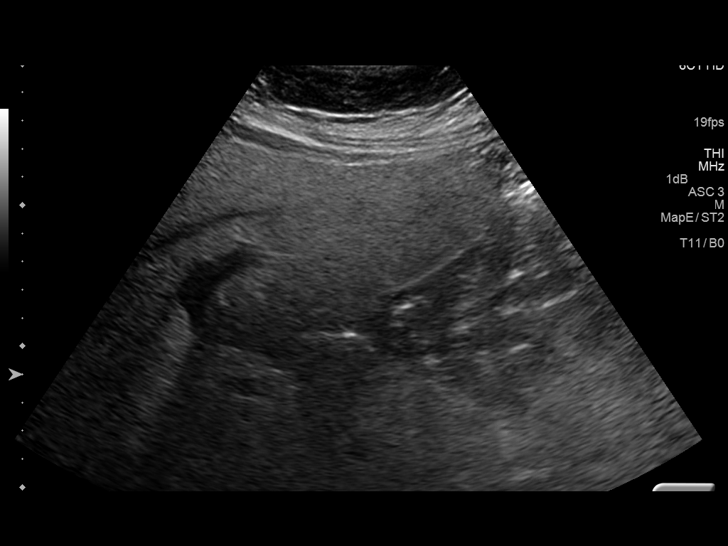
[im 24/38]
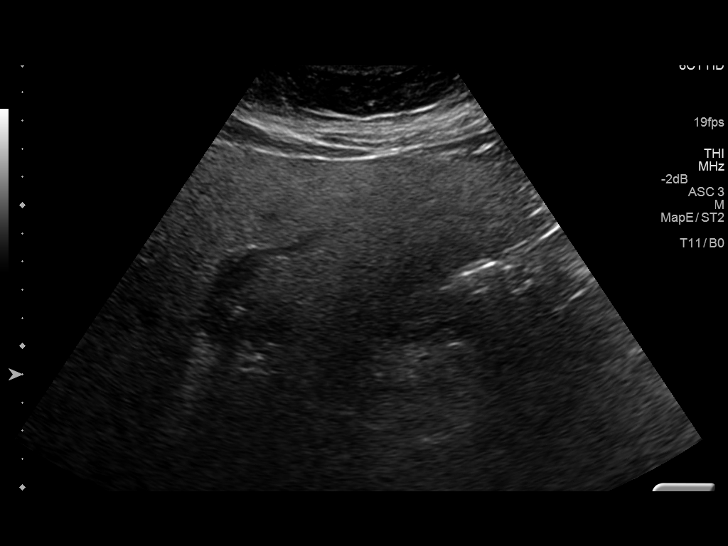
[im 25/38]
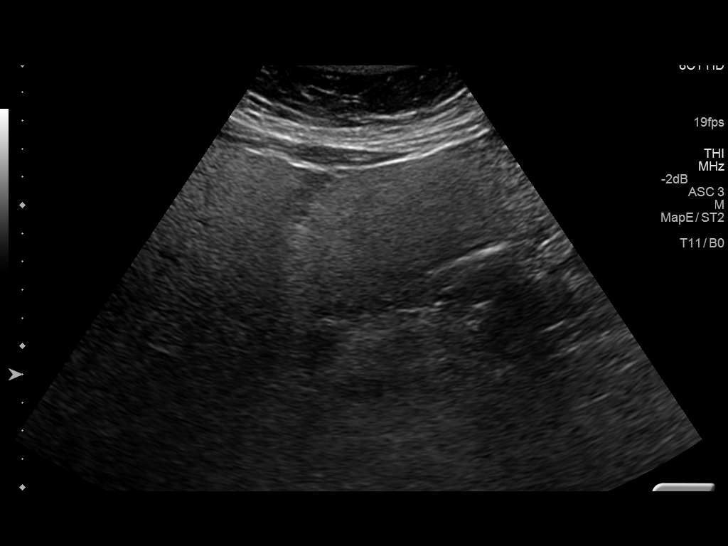
[im 28/38]
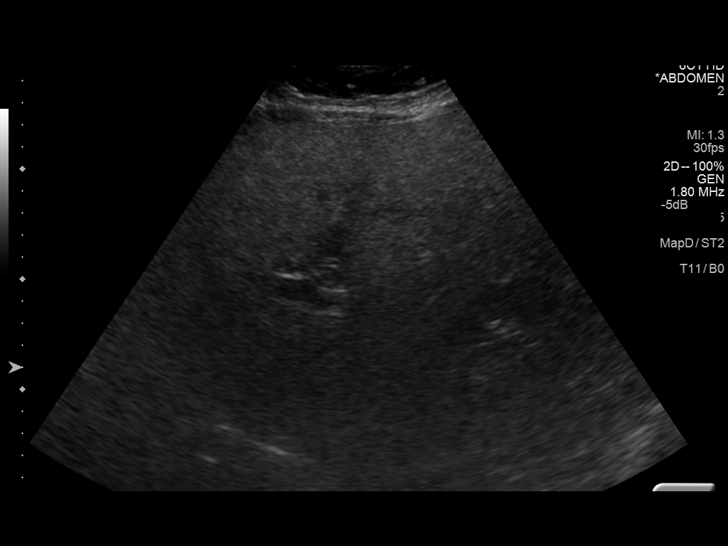
[im 31/38]
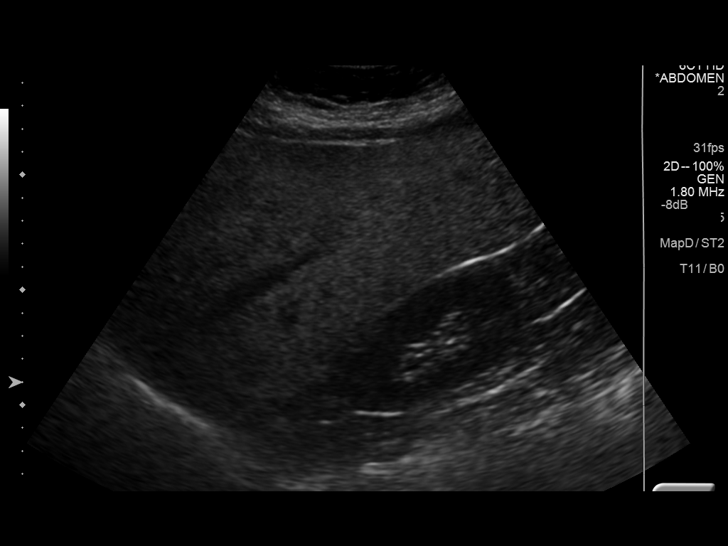
[im 34/38]
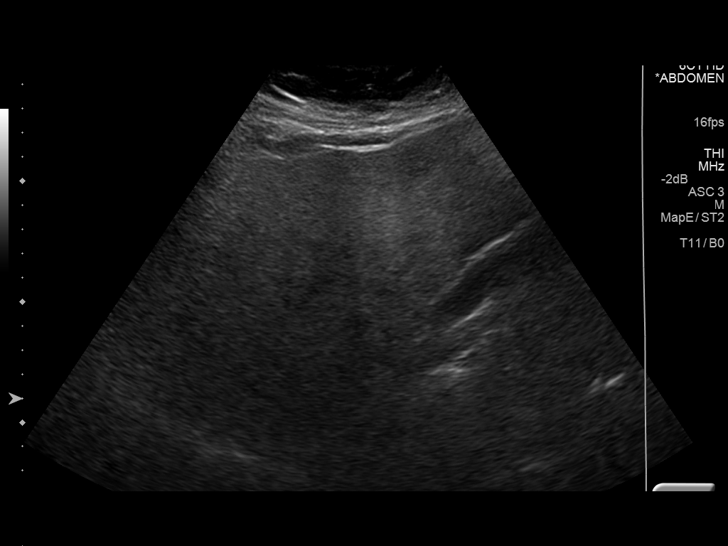
[im 38/38]
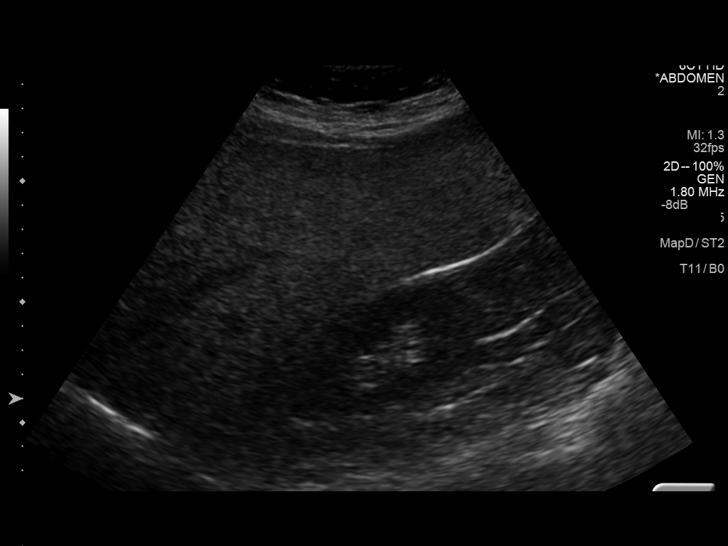

[14 of 25 positions shown; findings below may reference images not displayed]

FINDINGS: Gallbladder:

No gallstones or wall thickening visualized. No sonographic Murphy
sign noted.

Common bile duct:

Diameter: 5.1 mm

Liver:

The hepatic echotexture is mildly increased. There is no focal mass
nor ductal dilation. Evaluation of the limited was limited due to
the patient's body habitus.
IMPRESSION: Probable fatty infiltrative change of the liver. No acute
abnormality of the gallbladder or common bile duct.

## 2016-05-07 NOTE — Therapy (Signed)
[place] MAIN [facility] SERVICES [address] [place], [place], [address] Phone: [phone]   Fax:  [phone]  Occupational Therapy Treatment  Patient Details  Name: Danielle Lin MRN: [id number] Date of Birth: 01-07-1976 No Data Recorded  Encounter Date: 05/04/2016      OT End of Session - 05/07/16 1648    Visit Number 31   Number of Visits 36   Date for OT Re-Evaluation 05/06/16   OT Start Time 0205   OT Stop Time 0314   OT Time Calculation (min) 69 min   Activity Tolerance Patient tolerated treatment well;No increased pain   Behavior During Therapy WFL for tasks assessed/performed      Past Medical History:  Diagnosis Date  . Hypertension     Past Surgical History:  Procedure Laterality Date  . CARPAL TUNNEL RELEASE Bilateral   . CHOLECYSTECTOMY    . GASTROPLASTY DUODENAL SWITCH      There were no vitals filed for this visit.                                 OT Long Term Goals - 04/27/16 1102      OT LONG TERM GOAL #1   Title Lymphedema (LE) management/ self-care: Pt able to apply multi layered, gradient compression wraps with min caregiver assistance using proper techniques within 2 weeks to achieve optimal limb volume reduction.   Baseline dependent for LE self care   Time 2   Status Partially Met     OT LONG TERM GOAL #2   Title Lymphedema (LE) management/ self-care:  Pt to achieve at least 10% LLE limb volume reductions bilaterally during Intensive CDT to limit LE progression, decrease infection and falls risk, to reduce pain/, and to improve safe ambulation and functional mobility.   Baseline dependent for LE self care   Time 12   Period Weeks   Status Partially Met     OT LONG TERM GOAL #3   Title Lymphedema (LE) management/ self-care:  Pt >/= 85 % compliant with all daily, LE self-care protocols for home program w/ needed level of caregiver assistance , including simple self-manual  lymphatic drainage (MLD), skin care, lymphatic pumping the ex, skin care, and donning/ doffing compression wraps and garments o limit LE progression and further functional decline.     Baseline dependent for LE self care   Time 12   Period Weeks   Status Partially Met     OT LONG TERM GOAL #4   Title Lymphedema (LE) management/ self-care:  Pt to tolerate daily compression wraps, garments and devices in keeping w/ prescribed wear regime within 1 week of issue date to progress and retain clinical and functional gains and to limit LE progression.   Baseline dependent for LE self care   Time 12   Period Weeks   Status On-going     OT LONG TERM GOAL #5   Title Lymphedema (LE) self-care:  During Management Phase CDT Pt to sustain limb volume reductions achieved during Intensive Phase CDT within 5% utilizing LE self-care protocols, appropriate compression garments/ devices, and needed level of caregiver assistance.   Baseline dependent for LE self care   Time 6   Period Months             Patient will benefit from skilled therapeutic intervention in order to improve the following deficits and impairments:     Visit  Diagnosis: Lymphedema, not elsewhere classified    Problem List There are no active problems to display for this patient.   Andrey Spearman, MS, OTR/L, [facility] 05/07/16 4:50 PM  [place] MAIN [facility] SERVICES [address] [place], [place], [address] Phone: [phone]   Fax:  [phone]  Name: Danielle Lin MRN: [id number] Date of Birth: 12/26/1975

## 2016-05-10 ENCOUNTER — Ambulatory Visit: Payer: Managed Care, Other (non HMO) | Admitting: Occupational Therapy

## 2016-05-10 DIAGNOSIS — I890 Lymphedema, not elsewhere classified: Secondary | ICD-10-CM | POA: Diagnosis not present

## 2016-05-10 NOTE — Therapy (Signed)
[place] MAIN [facility] SERVICES [address] [place], [place], [address] Phone: [phone]   Fax:  [phone]  Occupational Therapy Treatment  Patient Details  Name: Danielle Lin MRN: [id number] Date of Birth: [birth year] No Data Recorded  Encounter Date: 05/10/2016      OT End of Session - 05/10/16 1155    Visit Number 32   Number of Visits 36   Date for OT Re-Evaluation 05/06/16   OT Start Time 0900   OT Stop Time 1006   OT Time Calculation (min) 66 min   Activity Tolerance Patient tolerated treatment well;No increased pain   Behavior During Therapy WFL for tasks assessed/performed      Past Medical History:  Diagnosis Date  . Hypertension     Past Surgical History:  Procedure Laterality Date  . CARPAL TUNNEL RELEASE Bilateral   . CHOLECYSTECTOMY    . GASTROPLASTY DUODENAL SWITCH      There were no vitals filed for this visit.      Subjective Assessment - 05/10/16 0911    Subjective  Pt presents for OT visit 32 to address moderate, Stage 3-4, BLE/BLQ Lipo-lymphedema. Encouraged Pt to contact DME vendor to check status of her compression garments.    Patient is accompained by: Family member   Pertinent History SIPPS sx 04/25/15 w/ weight at 306 lbs. Current weight 212.6 lbs. HTN, Asthma, OSA ( uses CPAP) B CTR   Limitations BLE chronic leg pain and swelling, L>R, difficulty walking, decreased standing tolerance, decreased activity tolerance, difficulty w/ functional mobility/ transfers   Patient Stated Goals decrease leg swelling and pain and keep it from getting worse.   Currently in Pain? No/denies   Pain Onset More than a month ago                      OT Treatments/Exercises (OP) - 05/10/16 0001      ADLs   ADL Education Given Yes     Manual Therapy   Manual Therapy Edema management;Manual Lymphatic Drainage (MLD);Compression Bandaging   Edema Management skin care to LLE as established   Manual  Lymphatic Drainage (MLD) MLD to LLE as established.   Compression Bandaging BLE wraped from ankle to groin as established.                 OT Education - 05/10/16 1154    Education provided Yes   Education Details Con tinued Equ[employer] trader Education  And LE ADL training throughout visit for lymphedema self care, including compression wrapping, compression garment and device wear/care, lymphatic pumping ther ex, simple self-MLD, and skin care. Discussed progress towards goals.   Person(s) Educated Patient   Methods Explanation   Comprehension Verbalized understanding;Need further instruction             OT Long Term Goals - 04/27/16 1102      OT LONG TERM GOAL #1   Title Lymphedema (LE) management/ self-care: Pt able to apply multi layered, gradient compression wraps with min caregiver assistance using proper techniques within 2 weeks to achieve optimal limb volume reduction.   Baseline dependent for LE self care   Time 2   Status Partially Met     OT LONG TERM GOAL #2   Title Lymphedema (LE) management/ self-care:  Pt to achieve at least 10% LLE limb volume reductions bilaterally during Intensive CDT to limit LE progression, decrease infection and falls risk, to reduce pain/, and to improve safe ambulation and  functional mobility.   Baseline dependent for LE self care   Time 12   Period Weeks   Status Partially Met     OT LONG TERM GOAL #3   Title Lymphedema (LE) management/ self-care:  Pt >/= 85 % compliant with all daily, LE self-care protocols for home program w/ needed level of caregiver assistance , including simple self-manual lymphatic drainage (MLD), skin care, lymphatic pumping the ex, skin care, and donning/ doffing compression wraps and garments o limit LE progression and further functional decline.     Baseline dependent for LE self care   Time 12   Period Weeks   Status Partially Met     OT LONG TERM GOAL #4   Title Lymphedema (LE) management/  self-care:  Pt to tolerate daily compression wraps, garments and devices in keeping w/ prescribed wear regime within 1 week of issue date to progress and retain clinical and functional gains and to limit LE progression.   Baseline dependent for LE self care   Time 12   Period Weeks   Status On-going     OT LONG TERM GOAL #5   Title Lymphedema (LE) self-care:  During Management Phase CDT Pt to sustain limb volume reductions achieved during Intensive Phase CDT within 5% utilizing LE self-care protocols, appropriate compression garments/ devices, and needed level of caregiver assistance.   Baseline dependent for LE self care   Time 6   Period Months               Plan - 05/10/16 1155    Clinical Impression Statement BLE below the knees are less tender with decreased swelling and dense fibrosis just proximal to ankles. Pt continues to tolerate Rx well, despite high burden of care for bilateral CDT . No difficulty w/ tolerance for MLD, skin care and compression therapy today. Fit garments/ devices ASAP.   Rehab Potential Good   OT Frequency 3x / week   OT Duration 12 weeks   OT Treatment/Interventions Self-care/ADL training;Therapeutic exercise;Patient/family education;Manual Therapy;Manual lymph drainage;Other (comment);DME and/or AE instruction;Compression bandaging;Therapeutic activities  skin care      Patient will benefit from skilled therapeutic intervention in order to improve the following deficits and impairments:  Decreased skin integrity, Decreased activity tolerance, Decreased knowledge of use of DME, Impaired flexibility, Decreased balance, Decreased mobility, Difficulty walking, Obesity, Increased edema, Pain, Abnormal gait  Visit Diagnosis: Lymphedema, not elsewhere classified    Problem List There are no active problems to display for this patient.  Andrey Spearman, MS, OTR/L, [facility] 05/10/16 11:58 AM  [place] MAIN [facility]  SERVICES [address] [place], [place], [address] Phone: [phone]   Fax:  [phone]  Name: Danielle Lin MRN: [id number] Date of Birth: 10/12/1975

## 2016-05-17 ENCOUNTER — Ambulatory Visit: Payer: Managed Care, Other (non HMO) | Admitting: Occupational Therapy

## 2016-05-22 ENCOUNTER — Encounter: Payer: Managed Care, Other (non HMO) | Admitting: Occupational Therapy

## 2016-05-23 ENCOUNTER — Ambulatory Visit: Payer: Managed Care, Other (non HMO) | Admitting: Occupational Therapy

## 2016-05-23 DIAGNOSIS — I890 Lymphedema, not elsewhere classified: Secondary | ICD-10-CM

## 2016-05-23 NOTE — Therapy (Signed)
[place] MAIN [facility] SERVICES [address] [place], [place], [address] Phone: [phone]   Fax:  [phone]  Occupational Therapy Treatment  Patient Details  Name: Danielle Lin MRN: [id number] Date of Birth: August 27, 1975 No Data Recorded  Encounter Date: 05/23/2016      OT End of Session - 05/23/16 1041    Visit Number 33   Number of Visits 36   Date for OT Re-Evaluation 05/06/16   OT Start Time 0810   OT Stop Time 0915   OT Time Calculation (min) 65 min   Activity Tolerance Patient tolerated treatment well;No increased pain   Behavior During Therapy WFL for tasks assessed/performed      Past Medical History:  Diagnosis Date  . Hypertension     Past Surgical History:  Procedure Laterality Date  . CARPAL TUNNEL RELEASE Bilateral   . CHOLECYSTECTOMY    . GASTROPLASTY DUODENAL SWITCH      There were no vitals filed for this visit.      Subjective Assessment - 05/23/16 0849    Subjective  Pt presents for OT visit 33 to address BLE lipo-lymphedema. Pt reports that she spoke with DME vendor yesterday after leaving multiple VM checking  status of her compression garments and HOS devices, Pt reports vendor informed her that daytime garrments have not been ordered and HOS devices have been declined, despite measurments taken in early December.  OT agreed to email vendor to check status and request instructions to file an appeal for [facility] [employer] medical necessity.   Patient is accompained by: Family member   Pertinent History SIPPS sx 04/25/15 w/ weight at 306 lbs. Current weight 212.6 lbs. HTN, Asthma, OSA ( uses CPAP) B CTR   Limitations BLE chronic leg pain and swelling, L>R, difficulty walking, decreased standing tolerance, decreased activity tolerance, difficulty w/ functional mobility/ transfers   Patient Stated Goals decrease leg swelling and pain and keep it from getting worse.   Currently in Pain? No/denies   Pain Onset More than a  month ago                      OT Treatments/Exercises (OP) - 05/23/16 0001      ADLs   ADL Education Given Yes     Manual Therapy   Manual Therapy Edema management;Manual Lymphatic Drainage (MLD);Compression Bandaging   Edema Management skin care to LLE as established   Manual Lymphatic Drainage (MLD) MLD to LLE as established.   Compression Bandaging BLE wraped from ankle to groin as established.                 OT Education - 05/23/16 1040    Education Details Con tinued skilled Pt/caregiver Education  And LE ADL training throughout visit for lymphedema self care, including compression wrapping, compression garment and device wear/care, lymphatic pumping ther ex, simple self-MLD, and skin care. Discussed progress towards goals.   Person(s) Educated Patient   Methods Explanation   Comprehension Verbalized understanding             OT Long Term Goals - 04/27/16 1102      OT LONG TERM GOAL #1   Title Lymphedema (LE) management/ self-care: Pt able to apply multi layered, gradient compression wraps with min caregiver assistance using proper techniques within 2 weeks to achieve optimal limb volume reduction.   Baseline dependent for LE self care   Time 2   Status Partially Met     OT LONG  TERM GOAL #2   Title Lymphedema (LE) management/ self-care:  Pt to achieve at least 10% LLE limb volume reductions bilaterally during Intensive CDT to limit LE progression, decrease infection and falls risk, to reduce pain/, and to improve safe ambulation and functional mobility.   Baseline dependent for LE self care   Time 12   Period Weeks   Status Partially Met     OT LONG TERM GOAL #3   Title Lymphedema (LE) management/ self-care:  Pt >/= 85 % compliant with all daily, LE self-care protocols for home program w/ needed level of caregiver assistance , including simple self-manual lymphatic drainage (MLD), skin care, lymphatic pumping the ex, skin care, and donning/  doffing compression wraps and garments o limit LE progression and further functional decline.     Baseline dependent for LE self care   Time 12   Period Weeks   Status Partially Met     OT LONG TERM GOAL #4   Title Lymphedema (LE) management/ self-care:  Pt to tolerate daily compression wraps, garments and devices in keeping w/ prescribed wear regime within 1 week of issue date to progress and retain clinical and functional gains and to limit LE progression.   Baseline dependent for LE self care   Time 12   Period Weeks   Status On-going     OT LONG TERM GOAL #5   Title Lymphedema (LE) self-care:  During Management Phase CDT Pt to sustain limb volume reductions achieved during Intensive Phase CDT within 5% utilizing LE self-care protocols, appropriate compression garments/ devices, and needed level of caregiver assistance.   Baseline dependent for LE self care   Time 6   Period Months               Plan - 05/23/16 1042    Clinical Impression Statement Pt  expressed concerns   b/c compression garments are not yet avaiilable . Two month delay in fitting may require new measaurement, which Pt understands, but is displeased about. Pt continues to utilize daily compression wraps in the meantime. OT emailed  vendor requesting status update and instructions for filing an appeall to insurance company as HOS devices denied due to  lack of medical necessity. Cont 2 x weekly uintil correctly fitting  garments and HOS devices are fitted by vendor.   Rehab Potential Good   OT Frequency 3x / week   OT Duration 12 weeks   OT Treatment/Interventions Self-care/ADL training;Therapeutic exercise;Patient/family education;Manual Therapy;Manual lymph drainage;Other (comment);DME and/or AE instruction;Compression bandaging;Therapeutic activities  skin care      Patient will benefit from skilled therapeutic intervention in order to improve the following deficits and impairments:  Decreased skin  integrity, Decreased activity tolerance, Decreased knowledge of use of DME, Impaired flexibility, Decreased balance, Decreased mobility, Difficulty walking, Obesity, Increased edema, Pain, Abnormal gait  Visit Diagnosis: Lymphedema, not elsewhere classified    Problem List There are no active problems to display for this patient.   Andrey Spearman, MS, OTR/L, [facility] 05/23/16 10:46 AM  [place] MAIN [facility] SERVICES [place], [place], [address] Phone: [phone]   Fax:  [phone]  Name: Danielle Lin MRN: [id number] Date of Birth: [birth year]

## 2016-05-25 ENCOUNTER — Ambulatory Visit: Payer: Managed Care, Other (non HMO) | Admitting: Occupational Therapy

## 2016-05-25 DIAGNOSIS — I890 Lymphedema, not elsewhere classified: Secondary | ICD-10-CM | POA: Diagnosis not present

## 2016-05-25 NOTE — Patient Instructions (Signed)
LE instructions and precautions as established- see initial eval.   

## 2016-05-25 NOTE — Therapy (Signed)
[place] MAIN [facility] SERVICES [address] [place], [place], [address] Phone: [phone]   Fax:  [phone]  Occupational Therapy Treatment  Patient Details  Name: Danielle Lin MRN: [id number] Date of Birth: [birth year] No Data Recorded  Encounter Date: 05/25/2016      OT End of Session - 05/25/16 1015    Visit Number 34   Number of Visits 36   Date for OT Re-Evaluation 05/06/16   OT Start Time 0803   OT Stop Time 0905   OT Time Calculation (min) 62 min   Activity Tolerance Patient tolerated treatment well;No increased pain   Behavior During Therapy WFL for tasks assessed/performed      Past Medical History:  Diagnosis Date  . Hypertension     Past Surgical History:  Procedure Laterality Date  . CARPAL TUNNEL RELEASE Bilateral   . CHOLECYSTECTOMY    . GASTROPLASTY DUODENAL SWITCH      There were no vitals filed for this visit.      Subjective Assessment - 05/25/16 1012    Subjective  Pt presents for OT visit 34 to address BLE lipo-lymphedema. Pt request contact information for alternative DME vendor. OT provided info for [facility], explaining they are out of network with [place]. Also recommended Pt contact [employer] to find out if other DME vendors in her area who are able to fit custom lymphedema garments are in her area.   Patient is accompained by: Family member   Pertinent History SIPPS sx 04/25/15 w/ weight at 306 lbs. Current weight 212.6 lbs. HTN, Asthma, OSA ( uses CPAP) B CTR   Limitations BLE chronic leg pain and swelling, L>R, difficulty walking, decreased standing tolerance, decreased activity tolerance, difficulty w/ functional mobility/ transfers   Patient Stated Goals decrease leg swelling and pain and keep it from getting worse.   Currently in Pain? No/denies   Pain Onset More than a month ago                      OT Treatments/Exercises (OP) - 05/25/16 0001      ADLs   ADL Education  Given Yes     Manual Therapy   Manual Therapy Edema management;Manual Lymphatic Drainage (MLD);Compression Bandaging   Edema Management skin care to LLE as established   Manual Lymphatic Drainage (MLD) MLD to LLE as established.   Compression Bandaging BLE wraped from ankle to groin as established.                 OT Education - 05/25/16 1015    Education provided Yes   Education Details Con tinued skilled Pt/caregiver Education  And LE ADL training throughout visit for lymphedema self care, including compression wrapping, compression garment and device wear/care, lymphatic pumping ther ex, simple self-MLD, and skin care. Discussed progress towards goals.   Person(s) Educated Patient   Methods Explanation   Comprehension Verbalized understanding             OT Long Term Goals - 04/27/16 1102      OT LONG TERM GOAL #1   Title Lymphedema (LE) management/ self-care: Pt able to apply multi layered, gradient compression wraps with min caregiver assistance using proper techniques within 2 weeks to achieve optimal limb volume reduction.   Baseline dependent for LE self care   Time 2   Status Partially Met     OT LONG TERM GOAL #2   Title Lymphedema (LE) management/ self-care:  Pt to  achieve at least 10% LLE limb volume reductions bilaterally during Intensive CDT to limit LE progression, decrease infection and falls risk, to reduce pain/, and to improve safe ambulation and functional mobility.   Baseline dependent for LE self care   Time 12   Period Weeks   Status Partially Met     OT LONG TERM GOAL #3   Title Lymphedema (LE) management/ self-care:  Pt >/= 85 % compliant with all daily, LE self-care protocols for home program w/ needed level of caregiver assistance , including simple self-manual lymphatic drainage (MLD), skin care, lymphatic pumping the ex, skin care, and donning/ doffing compression wraps and garments o limit LE progression and further functional decline.      Baseline dependent for LE self care   Time 12   Period Weeks   Status Partially Met     OT LONG TERM GOAL #4   Title Lymphedema (LE) management/ self-care:  Pt to tolerate daily compression wraps, garments and devices in keeping w/ prescribed wear regime within 1 week of issue date to progress and retain clinical and functional gains and to limit LE progression.   Baseline dependent for LE self care   Time 12   Period Weeks   Status On-going     OT LONG TERM GOAL #5   Title Lymphedema (LE) self-care:  During Management Phase CDT Pt to sustain limb volume reductions achieved during Intensive Phase CDT within 5% utilizing LE self-care protocols, appropriate compression garments/ devices, and needed level of caregiver assistance.   Baseline dependent for LE self care   Time 6   Period Months               Plan - 05/25/16 1016    Clinical Impression Statement Pt tolerated all aspects of CDT today, including LLE MLD and skin care, and compression wraps to BLE. Pt discouraged becuase garments have not been ordered and she is confused by vendor's explanation. OT provided contact info for an [employer] vendor and recommended Pt call her insurance company to further explore options. Cont as per POC.   Rehab Potential Good   OT Frequency 3x / week   OT Duration 12 weeks   OT Treatment/Interventions Self-care/ADL training;Therapeutic exercise;Patient/family education;Manual Therapy;Manual lymph drainage;Other (comment);DME and/or AE instruction;Compression bandaging;Therapeutic activities  skin care      Patient will benefit from skilled therapeutic intervention in order to improve the following deficits and impairments:  Decreased skin integrity, Decreased activity tolerance, Decreased knowledge of use of DME, Impaired flexibility, Decreased balance, Decreased mobility, Difficulty walking, Obesity, Increased edema, Pain, Abnormal gait  Visit Diagnosis: Lymphedema, not elsewhere  classified    Problem List There are no active problems to display for this patient.  Andrey Spearman, MS, OTR/L, [facility] 05/25/16 10:20 AM  [place] MAIN [facility] SERVICES [address] [place], [place], [address] Phone: [phone]   Fax:  [phone]  Name: Cyndal Kasson MRN: [id number] Date of Birth: [birth year]

## 2016-05-29 ENCOUNTER — Encounter: Payer: Managed Care, Other (non HMO) | Admitting: Occupational Therapy

## 2016-05-31 ENCOUNTER — Ambulatory Visit: Payer: Managed Care, Other (non HMO) | Attending: Family Medicine | Admitting: Occupational Therapy

## 2016-05-31 DIAGNOSIS — I890 Lymphedema, not elsewhere classified: Secondary | ICD-10-CM | POA: Insufficient documentation

## 2016-05-31 NOTE — Patient Instructions (Signed)
05/31/2016 1-Call insurance company to identify any local, alternative, in-network DME provider who is proficient in measuring and fitting custom LE Lymphedema compression garments. 2- Request printed invoice from DME vendor with itemized list of garments ordered, deductible info, co-pay info and balance due. Essentially, if insurance paid 90%, what was balance before 90% and how were benefits applied to give $550 balance? 3- Get printed warranty information 4-Discuss terms and options for payment w/ alternative DME vendor/s.

## 2016-05-31 NOTE — Therapy (Signed)
[place] [facility] MAIN [facility] SERVICES [address] [place], [place], [address] Phone: [phone]   Fax:  [phone]  Occupational Therapy Treatment  Patient Details  Name: Danielle Lin MRN: [id number] Date of Birth: February 14, 1976 No Data Recorded  Encounter Date: 05/31/2016      OT End of Session - 05/31/16 1044    Visit Number 35   Number of Visits 36   Date for OT Re-Evaluation 05/06/16   Authorization Type 35/36 CDT to BLE. Visit 5 for 2018   OT Start Time 0805   OT Stop Time 0915   OT Time Calculation (min) 70 min   Activity Tolerance Patient tolerated treatment well;No increased pain   Behavior During Therapy WFL for tasks assessed/performed      Past Medical History:  Diagnosis Date  . Hypertension     Past Surgical History:  Procedure Laterality Date  . CARPAL TUNNEL RELEASE Bilateral   . CHOLECYSTECTOMY    . GASTROPLASTY DUODENAL SWITCH      There were no vitals filed for this visit.      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 Pt frustrated again today re DME vendor. Pt advised to call her insurance company to discuss other in-network DME vendors who may be able to assist her w/ her compression garments.   Patient is accompained by: Family member   Pertinent History SIPPS sx 04/25/15 w/ weight at 306 lbs. Current weight 212.6 lbs. HTN, Asthma, OSA ( uses CPAP) B CTR   Limitations BLE chronic leg pain and swelling, L>R, difficulty walking, decreased standing tolerance, decreased activity tolerance, difficulty w/ functional mobility/ transfers   Patient Stated Goals decrease leg swelling and pain and keep it from getting worse.   Currently in Pain? No/denies   Pain Location Leg   Pain Orientation Left   Pain Descriptors / Indicators Sore   Pain Onset More than a month ago                      OT Treatments/Exercises (OP) - 05/31/16  1036      Manual Therapy   Manual Therapy Edema management;Manual Lymphatic Drainage (MLD);Compression Bandaging   Edema Management skin care to LLE as established   Manual Lymphatic Drainage (MLD) MLD to LLE as established.   Compression Bandaging BLE wraped from ankle to groin as established.                 OT Education - 05/31/16 1038    Education provided Yes   Education Details Con tinued skilled Pt/caregiver Education  And LE ADL training throughout visit for lymphedema self care, including compression wrapping, compression garment and device wear/care, lymphatic pumping ther ex, simple self-MLD, and skin care. Discussed progress towards goals.   Person(s) Educated Patient   Methods Explanation   Comprehension Verbalized understanding             OT Long Term Goals - 04/27/16 1102      OT LONG TERM GOAL #1   Title Lymphedema (LE) management/ self-care: Pt able to apply multi layered, gradient compression wraps with min caregiver assistance using proper techniques within 2 weeks to achieve optimal limb volume reduction.   Baseline dependent for LE self care   Time 2   Status Partially Met     OT LONG TERM GOAL #2   Title Lymphedema (LE) management/ self-care:  Pt to  achieve at least 10% LLE limb volume reductions bilaterally during Intensive CDT to limit LE progression, decrease infection and falls risk, to reduce pain/, and to improve safe ambulation and functional mobility.   Baseline dependent for LE self care   Time 12   Period Weeks   Status Partially Met     OT LONG TERM GOAL #3   Title Lymphedema (LE) management/ self-care:  Pt >/= 85 % compliant with all daily, LE self-care protocols for home program w/ needed level of caregiver assistance , including simple self-manual lymphatic drainage (MLD), skin care, lymphatic pumping the ex, skin care, and donning/ doffing compression wraps and garments o limit LE progression and further functional decline.      Baseline dependent for LE self care   Time 12   Period Weeks   Status Partially Met     OT LONG TERM GOAL #4   Title Lymphedema (LE) management/ self-care:  Pt to tolerate daily compression wraps, garments and devices in keeping w/ prescribed wear regime within 1 week of issue date to progress and retain clinical and functional gains and to limit LE progression.   Baseline dependent for LE self care   Time 12   Period Weeks   Status On-going     OT LONG TERM GOAL #5   Title Lymphedema (LE) self-care:  During Management Phase CDT Pt to sustain limb volume reductions achieved during Intensive Phase CDT within 5% utilizing LE self-care protocols, appropriate compression garments/ devices, and needed level of caregiver assistance.   Baseline dependent for LE self care   Time 6   Period Months               Plan - 05/31/16 1046    Clinical Impression Statement Pt tolerated LLE MLD today without difficulty despite reports of soreness. Decreased tension on foam layer slightly in effort to reduce discomfort in well decongested legs. Pt comtinuing to pursue clarrification from DME vendor re costs, balance due, etc. Recommended she discuss options for alternative, in - network vendors with her insurance company. Cont as per POC. Recertify next visit.   Rehab Potential Good   OT Frequency 3x / week   OT Duration 12 weeks   OT Treatment/Interventions Self-care/ADL training;Therapeutic exercise;Patient/family education;Manual Therapy;Manual lymph drainage;Other (comment);DME and/or AE instruction;Compression bandaging;Therapeutic activities  skin care      Patient will benefit from skilled therapeutic intervention in order to improve the following deficits and impairments:  Decreased skin integrity, Decreased activity tolerance, Decreased knowledge of use of DME, Impaired flexibility, Decreased balance, Decreased mobility, Difficulty walking, Obesity, Increased edema, Pain, Abnormal  gait  Visit Diagnosis: Lymphedema, not elsewhere classified    Problem List There are no active problems to display for this patient.  Andrey Spearman, MS, OTR/L, [facility] 05/31/16 10:51 AM  [place] MAIN [facility] SERVICES [address] [place], [place], [address] Phone: [phone]   Fax:  [phone]  Name: Danielle Lin MRN: [id number] Date of Birth: 1975-06-15

## 2016-06-01 ENCOUNTER — Ambulatory Visit: Payer: Managed Care, Other (non HMO) | Admitting: Occupational Therapy

## 2016-06-01 DIAGNOSIS — I890 Lymphedema, not elsewhere classified: Secondary | ICD-10-CM

## 2016-06-01 NOTE — Therapy (Signed)
[place] MAIN [facility] SERVICES [address] [place], [place], [address] Phone: [phone]   Fax:  [phone]  Occupational Therapy Treatment, Progress & Recerification Notes  Patient Details  Name: Danielle Lin MRN: [id number] Date of Birth: 11/23/1975 No Data Recorded  Encounter Date: 06/01/2016      OT End of Session - 06/01/16 0919    Visit Number 36   Number of Visits 36   Date for OT Re-Evaluation 08/30/16   Authorization Type 36/36 CDT to BLE. Visit 6 for 2018   Authorization - Number of Visits 36   OT Start Time 0806   OT Stop Time 0906   OT Time Calculation (min) 60 min   Activity Tolerance Patient tolerated treatment well;No increased pain   Behavior During Therapy WFL for tasks assessed/performed      Past Medical History:  Diagnosis Date  . Hypertension     Past Surgical History:  Procedure Laterality Date  . CARPAL TUNNEL RELEASE Bilateral   . CHOLECYSTECTOMY    . GASTROPLASTY DUODENAL SWITCH      There were no vitals filed for this visit.      Subjective Assessment - 06/01/16 0913    Subjective  Pt presents for OT visit 36/ 36 to address sever, stage 4, BLE lipo-lymphedema. Pt brings compression wraps to clinic. Pt states she continues to be frustrated with DME vendor because her custom compression garments are not yet available for fitting. "I'm calling my insurance company today."   Patient is accompained by: Family member   Pertinent History SIPPS sx 04/25/15 w/ weight at 306 lbs. Current weight 212.6 lbs. HTN, Asthma, OSA ( uses CPAP) B CTR   Limitations BLE chronic leg pain and swelling, L>R, difficulty walking, decreased standing tolerance, decreased activity tolerance, difficulty w/ functional mobility/ transfers   Patient Stated Goals decrease leg swelling and pain and keep it from getting worse.   Currently in Pain? Yes   Pain Location Leg   Pain Orientation Left;Right   Pain Descriptors / Indicators  Sore   Pain Onset More than a month ago   Pain Frequency Intermittent   Effect of Pain on Daily Activities limits standing tolerance, sitting tolerance, and causes difficulty walking                      OT Treatments/Exercises (OP) - 06/01/16 0001      ADLs   ADL Education Given Yes     Manual Therapy   Manual Therapy Edema management;Manual Lymphatic Drainage (MLD);Compression Bandaging   Edema Management skin care to LLE as established   Manual Lymphatic Drainage (MLD) MLD to LLE as established.   Compression Bandaging BLE wraped from ankle to groin as established.                 OT Education - 06/01/16 [phone]    Education provided Yes   Education Details Continued skilled Pt/caregiver education  And LE ADL training throughout visit for lymphedema self care/ home program, including compression wrapping, compression garment and device wear/care, lymphatic pumping ther ex, simple self-MLD, and skin care. Discussed progress towards goals.   Person(s) Educated Patient   Methods Explanation   Comprehension Verbalized understanding;Need further instruction             OT Long Term Goals - 06/01/16 5329      OT LONG TERM GOAL #1   Title Lymphedema (LE) management/ self-care: Pt able to apply multi layered, gradient  compression wraps with min caregiver assistance using proper techniques within 2 weeks to achieve optimal limb volume reduction.   Baseline dependent for LE self care   Time 2   Status Achieved     OT LONG TERM GOAL #2   Title Lymphedema (LE) management/ self-care:  Pt to achieve at least 10% LLE limb volume reductions bilaterally during Intensive CDT to limit LE progression, decrease infection and falls risk, to reduce pain/, and to improve safe ambulation and functional mobility.   Baseline dependent for LE self care   Time 12   Period Weeks   Status Partially Met     OT LONG TERM GOAL #3   Title Lymphedema (LE) management/ self-care:   Pt >/= 85 % compliant with all daily, LE self-care protocols for home program w/ needed level of caregiver assistance , including simple self-manual lymphatic drainage (MLD), skin care, lymphatic pumping the ex, skin care, and donning/ doffing compression wraps and garments o limit LE progression and further functional decline.     Baseline dependent for LE self care   Time 12   Period Weeks   Status Achieved     OT LONG TERM GOAL #4   Title Lymphedema (LE) management/ self-care:  Pt to tolerate daily compression wraps, garments and devices in keeping w/ prescribed wear regime within 1 week of issue date to progress and retain clinical and functional gains and to limit LE progression.   Baseline dependent for LE self care   Time 12   Period Weeks   Status Partially Met     OT LONG TERM GOAL #5   Title Lymphedema (LE) self-care:  During Management Phase CDT Pt to sustain limb volume reductions achieved during Intensive Phase CDT within 5% utilizing LE self-care protocols, appropriate compression garments/ devices, and needed level of caregiver assistance.   Baseline dependent for LE self care   Time 6   Period Months   Status On-going               Plan - 06/01/16 0973    Clinical Impression Statement Pt continues to actively participate in and tolerate all aspects of CDT to BLE , including MLD, compression therapy, skin care, and therapeutic exercise. Pt has made slow , but steady progress towards all OT goals adressing severe, stage 4, complex, BLE lipo-lymphedema; however, to date, she has not yet achieved target limb volume reductions and she has not yet received custom, multi-piece, BLE compression garments by DME vendor. She has been somewhat frustrated with customer service from them and plans to discuss her concerns with her insurance company today. She has also been exploring fitting by alternative vendor if insurance company will allow an out-of-network exception. Pt will  benefit from extending POC to ensure optimal clinical and functional outcomes.  Pt will benefit from extending visits beyond original POC to maximize progress towards goals and to support retention of functional gains made thus far.   Rehab Potential Good   OT Frequency 3x / week   OT Duration 12 weeks   OT Treatment/Interventions Self-care/ADL training;Therapeutic exercise;Patient/family education;Manual Therapy;Manual lymph drainage;Other (comment);DME and/or AE instruction;Compression bandaging;Therapeutic activities  skin care   OT Home Exercise Plan simple self MLD to BLE 1 x daily-full sequence; BLE lymphatic pumping ther ex 1 x daily- 10 reps each in sequence; full time (23/7)  short stretch compression wraps daily until custom garments delivered and fitted; flxitouch sequential pneumatic compression device on alternating legs daily and PRN; daily skin care  Recommended Other Services For daytime use: BLE, custom Jobst Elvarex (flat knit) capri pantyhose ( flat knit ccl 2= 25-35 mmHg) over ccl 2 knee highs. Panty portion ccl 1 ( flat knit ccl = 15-20 mmHg). For HOS: BLE Jobst RELAX thigh length, low profile, convoluted, quilted foam leggings to facilitate LE flow and limit fibrosis formation during HOS   Consulted and Agree with Plan of Care Patient      Patient will benefit from skilled therapeutic intervention in order to improve the following deficits and impairments:  Decreased skin integrity, Decreased activity tolerance, Decreased knowledge of use of DME, Impaired flexibility, Decreased balance, Decreased mobility, Difficulty walking, Obesity, Increased edema, Pain, Abnormal gait, Decreased knowledge of precautions  Visit Diagnosis: Lymphedema, not elsewhere classified - Plan: Ot plan of care cert/re-cert    Problem List There are no active problems to display for this patient.  Andrey Spearman, MS, OTR/L, [facility] 06/01/16 9:48 AM  [place]  MAIN [facility] SERVICES [address] [place], [place], [address] Phone: [phone]   Fax:  [phone]  Name: Danielle Lin MRN: [id number] Date of Birth: 1976/04/15

## 2016-06-01 NOTE — Patient Instructions (Signed)
05/31/2016 1-Call insurance company to identify any local, alternative, in-network DME provider who is proficient in measuring and fitting custom LE Lymphedema compression garments. 2- Request printed invoice from DME vendor with itemized list of garments ordered, deductible info, co-pay info and balance due. Essentially, if insurance paid 90%, what was balance before 90% and how were benefits applied to give $550 balance? 3- Get printed warranty information 4-Discuss terms and options for payment w/ alternative DME vendor/s.  Lymphedema Precautions  If you experience atypical shortness of breath, or notice any signs /symptoms of skin infection (aka cellulitis) remove all compression wraps/ garments, discontinue manual lymphatic drainage (MLD), and report symptoms to your physician immediately. Discontinue MLD and compression for 72 hours after you take your first oral antibiotic so not to spread the infection.   Lymphedema Self- Care Instructions  1. EXERCISE: Perform lymphatic pumping there ex 2 x a day. While wearing your compression wraps or garments. Perform 10 reps of each exercise bilaterally and be sure to perform them in order. Don;t skip around!  OMIT PARTIAL SIT UP  2. MLD: Perform simple self-Manual Lymphatic Drainage (MLD) at least once a day as directed.  3. WRAPS: Compression wraps are to be worn 23 hrs/ 7 days/wk during Intensive Phase of Complete Decongestive Therapy (CDT).Building tolerance may take time and practice, so don't get discouraged. If bandages begin to feel tight during periods of inactivity and/or during the night, try performing your exercises to loosen them.   4. GARMENTS: During Management Phase CDT your compression garments are to be worn during waking hours when active. Do NOT sleep in your garments!!   5. PUT YOUR FEET UP! Elevate your feet and legs and feet to the level of your heart whenever you are sitting down.   6. SKIN: Carefully monitor skin condition  and perform impeccable hygiene daily. Bathe skin with mild soap and water and apply low pH lotion (aka Eucerin ) to improve hydration and limit infection risk.    Lymphatic Pumping Exercises:

## 2016-06-06 ENCOUNTER — Ambulatory Visit: Payer: Managed Care, Other (non HMO) | Admitting: Occupational Therapy

## 2016-06-06 DIAGNOSIS — I890 Lymphedema, not elsewhere classified: Secondary | ICD-10-CM

## 2016-06-06 NOTE — Therapy (Signed)
[place] [facility] MAIN [facility] SERVICES [address] [place], [place], [address] Phone: [phone]   Fax:  [phone]  Occupational Therapy Treatment  Patient Details  Name: Danielle Lin MRN: [id number] Date of Birth: February 03, 1976 No Data Recorded  Encounter Date: 06/06/2016      OT End of Session - 06/06/16 1436    Visit Number 37   Number of Visits 72   Date for OT Re-Evaluation 08/30/16   Authorization Type 36/36 CDT to BLE. Visit 6 for 2018   Authorization Time Period additional 12 weeks starting    Authorization - Number of Visits 36   OT Start Time 1000   OT Stop Time 1108   OT Time Calculation (min) 68 min   Activity Tolerance Patient tolerated treatment well;No increased pain   Behavior During Therapy WFL for tasks assessed/performed      Past Medical History:  Diagnosis Date  . Hypertension     Past Surgical History:  Procedure Laterality Date  . CARPAL TUNNEL RELEASE Bilateral   . CHOLECYSTECTOMY    . GASTROPLASTY DUODENAL SWITCH      There were no vitals filed for this visit.      Subjective Assessment - 06/06/16 1433    Subjective  Pt presents for OT visit 37/ 72 visits to address sever, stage 4, BLE lipo-lymphedema. Pt brings compression wraps to clinic. Pt is frustrated and tearful today. "I dont know what to do about my garments. I talked to my insurance company and they gave me names for 2 other in network providers, but when we called them we got no answere at either place."    Patient is accompained by: Family member   Pertinent History SIPPS sx 04/25/15 w/ weight at 306 lbs. Current weight 212.6 lbs. HTN, Asthma, OSA ( uses CPAP) B CTR   Limitations BLE chronic leg pain and swelling, L>R, difficulty walking, decreased standing tolerance, decreased activity tolerance, difficulty w/ functional mobility/ transfers   Patient Stated Goals decrease leg swelling and pain and keep it from getting worse.   Currently in Pain?  No/denies   Pain Onset More than a month ago                      OT Treatments/Exercises (OP) - 06/06/16 0001      ADLs   ADL Education Given Yes     Manual Therapy   Manual Therapy Edema management;Manual Lymphatic Drainage (MLD);Compression Bandaging   Edema Management skin care to LLE as established   Manual Lymphatic Drainage (MLD) MLD to LLE as established.   Compression Bandaging BLE wraped from ankle to groin as established.                 OT Education - 06/06/16 1441    Education provided Yes   Education Details Cont to educate Pt on custom compression garment/ device measuring and fitting process.   Person(s) Educated Patient   Methods Explanation   Comprehension Verbalized understanding             OT Long Term Goals - 06/01/16 1152      OT LONG TERM GOAL #1   Title Lymphedema (LE) management/ self-care: Pt able to apply multi layered, gradient compression wraps with min caregiver assistance using proper techniques within 2 weeks to achieve optimal limb volume reduction.   Baseline dependent for LE self care   Time 2   Status Achieved     OT LONG TERM  GOAL #2   Title Lymphedema (LE) management/ self-care:  Pt to achieve at least 10% LLE limb volume reductions bilaterally during Intensive CDT to limit LE progression, decrease infection and falls risk, to reduce pain/, and to improve safe ambulation and functional mobility.   Baseline dependent for LE self care   Time 12   Period Weeks   Status Partially Met     OT LONG TERM GOAL #3   Title Lymphedema (LE) management/ self-care:  Pt >/= 85 % compliant with all daily, LE self-care protocols for home program w/ needed level of caregiver assistance , including simple self-manual lymphatic drainage (MLD), skin care, lymphatic pumping the ex, skin care, and donning/ doffing compression wraps and garments o limit LE progression and further functional decline.     Baseline dependent for LE  self care   Time 12   Period Weeks   Status Achieved     OT LONG TERM GOAL #4   Title Lymphedema (LE) management/ self-care:  Pt to tolerate daily compression wraps, garments and devices in keeping w/ prescribed wear regime within 1 week of issue date to progress and retain clinical and functional gains and to limit LE progression.   Baseline dependent for LE self care   Time 12   Period Weeks   Status Partially Met     OT LONG TERM GOAL #5   Title Lymphedema (LE) self-care:  During Management Phase CDT Pt to sustain limb volume reductions achieved during Intensive Phase CDT within 5% utilizing LE self-care protocols, appropriate compression garments/ devices, and needed level of caregiver assistance.   Baseline dependent for LE self care   Time 6   Period Months   Status On-going               Plan - 06/06/16 1443    Clinical Impression Statement Pt presenting w/ slight rash on posterior calves, which appears to be chafing from wraps. Pt tolerated MLD today w/out difficulty. Pt continues to be frustrated with DME providers and she is under stress with home and work issues, including exploring IVF. Pt in agreement with plan to arrange for fitting of one set of custom garments only while requesting itemized bill again. Pt will apply compression wraps q 3-5 nights until she has financial resources to obtain HOS devices. Pt will continue to perform skin care daily and will use Flexitouch  sequential pump on alternating legs 4-5 x weekly. Cont as per POC.    Rehab Potential Good   OT Frequency 3x / week   OT Duration 12 weeks   OT Treatment/Interventions Self-care/ADL training;Therapeutic exercise;Patient/family education;Manual Therapy;Manual lymph drainage;Other (comment);DME and/or AE instruction;Compression bandaging;Therapeutic activities  skin care   OT Home Exercise Plan simple self MLD to BLE 1 x daily-full sequence; BLE lymphatic pumping ther ex 1 x daily- 10 reps each in  sequence; full time (23/7)  short stretch compression wraps daily until custom garments delivered and fitted; flxitouch sequential pneumatic compression device on alternating legs daily and PRN; daily skin care   Consulted and Agree with Plan of Care Patient      Patient will benefit from skilled therapeutic intervention in order to improve the following deficits and impairments:  Decreased skin integrity, Decreased activity tolerance, Decreased knowledge of use of DME, Impaired flexibility, Decreased balance, Decreased mobility, Difficulty walking, Obesity, Increased edema, Pain, Abnormal gait, Decreased knowledge of precautions  Visit Diagnosis: Lymphedema, not elsewhere classified    Problem List There are no active problems to  display for this patient.  Andrey Spearman, MS, OTR/L, [facility] 06/06/16 2:49 PM  [place] MAIN [facility] SERVICES [address] [place], [place], [address] Phone: [phone]   Fax:  [phone]  Name: Johnie Stadel MRN: [id number] Date of Birth: 1975/10/28

## 2016-06-07 ENCOUNTER — Encounter: Payer: Managed Care, Other (non HMO) | Admitting: Occupational Therapy

## 2016-06-08 ENCOUNTER — Ambulatory Visit: Payer: Managed Care, Other (non HMO) | Admitting: Occupational Therapy

## 2016-06-08 DIAGNOSIS — I890 Lymphedema, not elsewhere classified: Secondary | ICD-10-CM

## 2016-06-08 NOTE — Therapy (Signed)
[place] MAIN [facility] SERVICES [address] [place], [place], [address] Phone: [phone]   Fax:  [phone]  Occupational Therapy Treatment  Patient Details  Name: Danielle Lin MRN: [id number] Date of Birth: 23-Jan-1976 No Data Recorded  Encounter Date: 06/08/2016      OT End of Session - 06/08/16 1151    Visit Number 21   Number of Visits 54   Date for OT Re-Evaluation 08/30/16   Authorization Type 38/72CDT to BLE. Visit 7 for 2018   Authorization Time Period additional 12 weeks starting 06/08/16   Authorization - Number of Visits 36   OT Start Time 0803   OT Stop Time 0910   OT Time Calculation (min) 67 min   Activity Tolerance Patient tolerated treatment well;No increased pain   Behavior During Therapy WFL for tasks assessed/performed      Past Medical History:  Diagnosis Date  . Hypertension     Past Surgical History:  Procedure Laterality Date  . CARPAL TUNNEL RELEASE Bilateral   . CHOLECYSTECTOMY    . GASTROPLASTY DUODENAL SWITCH      There were no vitals filed for this visit.      Subjective Assessment - 06/08/16 0852    Subjective  Pt presents for OT visit 38/ 72 visits to address sever, stage 4, BLE lipo-lymphedema. Pt brings compression wraps to clinic. Pt is in better spirits today. She has no new complaints.   Patient is accompained by: Family member   Pertinent History SIPPS sx 04/25/15 w/ weight at 306 lbs. Current weight 212.6 lbs. HTN, Asthma, OSA ( uses CPAP) B CTR   Limitations BLE chronic leg pain and swelling, L>R, difficulty walking, decreased standing tolerance, decreased activity tolerance, difficulty w/ functional mobility/ transfers   Patient Stated Goals decrease leg swelling and pain and keep it from getting worse.   Currently in Pain? No/denies   Pain Onset More than a month ago             LYMPHEDEMA/ONCOLOGY QUESTIONNAIRE - 06/08/16 1129      Right Lower Extremity Lymphedema   Other  RLE A-D =6768.44 ml. A-D decreased 0.10% since last measured on 04/06/16. LLE A-G measures 16017.45 ml, which reveals a 0.26% decrease since 12/8.    Other RLE A-D volume today reveals 0/16/17. RLE A-G is actually increased by 0.24% since initiating treatment.2.76% limb volume decrease of 2.76% since intially measured on      Left Lower Extremity Lymphedema   Other LLE A-D ( below the knee) limb volume=7210.93 ml, which is decreased by .89% % since last measured on 04/06/16. It is decreased since initially measured on 02/13/16 by 6.7%..   Other LLE A-G ( ankle to groin) limb volume= 16565.72 ml, which is decreased by 1.15%% since last measured on 12/8. LLE A-G limb volume is decreased by 3.57% overall since initially measured 02/13/16.                 OT Treatments/Exercises (OP) - 06/08/16 0001      ADLs   ADL Education Given Yes     Manual Therapy   Manual Therapy Edema management;Compression Bandaging   Manual therapy comments BLE comparative volumetrics   Compression Bandaging BLE wraped from ankle to groin as established.                 OT Education - 06/08/16 0920    Education provided Yes   Education Details Continued skilled Pt/caregiver education  And LE  ADL training throughout visit for lymphedema self care/ home program, including compression wrapping, compression garment and device wear/care, lymphatic pumping ther ex, simple self-MLD, and skin care. Discussed progress towards goals.   Person(s) Educated Patient   Methods Explanation   Comprehension Verbalized understanding             OT Long Term Goals - 06/08/16 1152      OT LONG TERM GOAL #1   Title Lymphedema (LE) management/ self-care: Pt able to apply multi layered, gradient compression wraps with min caregiver assistance using proper techniques within 2 weeks to achieve optimal limb volume reduction.   Baseline dependent for LE self care   Time 2   Status Achieved     OT LONG TERM GOAL #2    Title Lymphedema (LE) management/ self-care:  Pt to achieve at least 10% LLE limb volume reductions bilaterally during Intensive CDT to limit LE progression, decrease infection and falls risk, to reduce pain/, and to improve safe ambulation and functional mobility.   Baseline dependent for LE self care   Time 12   Period Weeks   Status Partially Met     OT LONG TERM GOAL #3   Title Lymphedema (LE) management/ self-care:  Pt >/= 85 % compliant with all daily, LE self-care protocols for home program w/ needed level of caregiver assistance , including simple self-manual lymphatic drainage (MLD), skin care, lymphatic pumping the ex, skin care, and donning/ doffing compression wraps and garments o limit LE progression and further functional decline.     Baseline dependent for LE self care   Time 12   Period Weeks   Status Achieved     OT LONG TERM GOAL #4   Title Lymphedema (LE) management/ self-care:  Pt to tolerate daily compression wraps, garments and devices in keeping w/ prescribed wear regime within 1 week of issue date to progress and retain clinical and functional gains and to limit LE progression.   Baseline dependent for LE self care   Time 12   Period Weeks   Status Achieved     OT LONG TERM GOAL #5   Title Lymphedema (LE) self-care:  During Management Phase CDT Pt to sustain limb volume reductions achieved during Intensive Phase CDT within 5% utilizing LE self-care protocols, appropriate compression garments/ devices, and needed level of caregiver assistance.   Baseline dependent for LE self care   Time 6   Period Months   Status On-going               Plan - 06/08/16 1138    Clinical Impression Statement BLE comparative limb volumetrics reveal the following changes: RLE A-D volume is decreased 0.10%, and LLE A-G volume is decrreased by 0.26% since last measured on 04/06/16. RLE A-D volume is decreased by 2.76% overall , and A-G volume is  actually increased by 0.24%  since intial mwasurements taken on 02/13/2016. LLE A-D ( below the knee) limb volume is decreased by .89% % since last measured on 04/06/16, and decreased by  6.7% since initally measured on 10/16.   LLE A-G ( ankle to groin) limb volume is decreased by 1.15%% since last measured on 12/8. LLE A-G limb volume is decreased by 3.57% overall since initially measured 02/13/16. These values demonstrate slow, but steady progress towards goals, but do not meet goals for volumetric reduction. Pt has achieved 85% compliance w LE self care goal, and she remins fully engaged in all asects of therapy. Expect Pt may need to  be remeasured for compression garments b/c she has not yet been fitted by DME provider. Contas per POC. Continue to support Pt w/ this very challenging lipo-lymphedema diagnosis to avieve full rehab potential for LE management and self care.   Rehab Potential Good   OT Frequency 2x / week   OT Duration 12 weeks   OT Treatment/Interventions Self-care/ADL training;Therapeutic exercise;Patient/family education;Manual Therapy;Manual lymph drainage;Other (comment);DME and/or AE instruction;Compression bandaging;Therapeutic activities  skin care   OT Home Exercise Plan simple self MLD to BLE 1 x daily-full sequence; BLE lymphatic pumping ther ex 1 x daily- 10 reps each in sequence; full time (23/7)  short stretch compression wraps daily until custom garments delivered and fitted; flxitouch sequential pneumatic compression device on alternating legs daily and PRN; daily skin care   Consulted and Agree with Plan of Care Patient      Patient will benefit from skilled therapeutic intervention in order to improve the following deficits and impairments:  Decreased skin integrity, Decreased activity tolerance, Decreased knowledge of use of DME, Impaired flexibility, Decreased balance, Decreased mobility, Difficulty walking, Obesity, Increased edema, Pain, Abnormal gait, Decreased knowledge of precautions  Visit  Diagnosis: Lymphedema, not elsewhere classified    Problem List There are no active problems to display for this patient.  Andrey Spearman, MS, OTR/L, [facility] 06/08/16 11:55 AM  [place] MAIN [facility] SERVICES [address] [place], [place], [address] Phone: [phone]   Fax:  [phone]  Name: Danielle Lin MRN: [id number] Date of Birth: [birth year]

## 2016-06-13 ENCOUNTER — Ambulatory Visit: Payer: Managed Care, Other (non HMO) | Admitting: Occupational Therapy

## 2016-06-13 DIAGNOSIS — I890 Lymphedema, not elsewhere classified: Secondary | ICD-10-CM

## 2016-06-13 NOTE — Therapy (Signed)
[place] MAIN [facility] SERVICES [address] [place], [place], [address] Phone: [phone]   Fax:  [phone]  Occupational Therapy Treatment  Patient Details  Name: Danielle Lin MRN: [id number] Date of Birth: August 10, 1975 No Data Recorded  Encounter Date: 06/13/2016      OT End of Session - 06/13/16 1223    Visit Number 39   Number of Visits 83   Date for OT Re-Evaluation 08/30/16   Authorization Type 39/72CDT to BLE. Visit 9 for 2018   Authorization Time Period additional 12 weeks starting 06/08/16   Authorization - Number of Visits 36   OT Start Time 0804   OT Stop Time 0915   OT Time Calculation (min) 71 min   Activity Tolerance Patient tolerated treatment well;No increased pain   Behavior During Therapy WFL for tasks assessed/performed      Past Medical History:  Diagnosis Date  . Hypertension     Past Surgical History:  Procedure Laterality Date  . CARPAL TUNNEL RELEASE Bilateral   . CHOLECYSTECTOMY    . GASTROPLASTY DUODENAL SWITCH      There were no vitals filed for this visit.      Subjective Assessment - 06/13/16 0856    Subjective  Pt presents for OT visit 39/72 visits ( visit 9 for 2018) to address sever, stage 4, BLE lipo-lymphedema. Pt brings compression wraps to clinic. Pt reports compression wraps fell down from top edge on RLE soon after last session. "I don't think it was tight enough. I had to re-wrap it. I'll be so glad to get my stockings."   Patient is accompained by: Family member   Pertinent History SIPPS sx 04/25/15 w/ weight at 306 lbs. Current weight 212.6 lbs. HTN, Asthma, OSA ( uses CPAP) B CTR   Limitations BLE chronic leg pain and swelling, L>R, difficulty walking, decreased standing tolerance, decreased activity tolerance, difficulty w/ functional mobility/ transfers   Patient Stated Goals decrease leg swelling and pain and keep it from getting worse.   Currently in Pain? Yes  not rated  numerically   Pain Location Leg   Pain Orientation Right;Left   Pain Descriptors / Indicators Sore   Pain Type Chronic pain   Pain Onset More than a month ago                      OT Treatments/Exercises (OP) - 06/13/16 0001      ADLs   ADL Education Given Yes     Manual Therapy   Manual Therapy Edema management;Compression Bandaging   Edema Management skin care to LLE as established   Manual Lymphatic Drainage (MLD) MLD to LLE as established.   Compression Bandaging BLE wraped from ankle to groin as established.                 OT Education - 06/13/16 1226    Education provided Yes   Education Details Continued skilled Pt/caregiver education  And LE ADL training throughout visit for lymphedema self care/ home program, including compression wrapping, compression garment and device wear/care, lymphatic pumping ther ex, simple self-MLD, and skin care. Discussed progress towards goals.   Person(s) Educated Patient   Methods Explanation   Comprehension Verbalized understanding             OT Long Term Goals - 06/08/16 1152      OT LONG TERM GOAL #1   Title Lymphedema (LE) management/ self-care: Pt able to apply multi layered,  gradient compression wraps with min caregiver assistance using proper techniques within 2 weeks to achieve optimal limb volume reduction.   Baseline dependent for LE self care   Time 2   Status Achieved     OT LONG TERM GOAL #2   Title Lymphedema (LE) management/ self-care:  Pt to achieve at least 10% LLE limb volume reductions bilaterally during Intensive CDT to limit LE progression, decrease infection and falls risk, to reduce pain/, and to improve safe ambulation and functional mobility.   Baseline dependent for LE self care   Time 12   Period Weeks   Status Partially Met     OT LONG TERM GOAL #3   Title Lymphedema (LE) management/ self-care:  Pt >/= 85 % compliant with all daily, LE self-care protocols for home program w/  needed level of caregiver assistance , including simple self-manual lymphatic drainage (MLD), skin care, lymphatic pumping the ex, skin care, and donning/ doffing compression wraps and garments o limit LE progression and further functional decline.     Baseline dependent for LE self care   Time 12   Period Weeks   Status Achieved     OT LONG TERM GOAL #4   Title Lymphedema (LE) management/ self-care:  Pt to tolerate daily compression wraps, garments and devices in keeping w/ prescribed wear regime within 1 week of issue date to progress and retain clinical and functional gains and to limit LE progression.   Baseline dependent for LE self care   Time 12   Period Weeks   Status Achieved     OT LONG TERM GOAL #5   Title Lymphedema (LE) self-care:  During Management Phase CDT Pt to sustain limb volume reductions achieved during Intensive Phase CDT within 5% utilizing LE self-care protocols, appropriate compression garments/ devices, and needed level of caregiver assistance.   Baseline dependent for LE self care   Time 6   Period Months   Status On-going               Plan - 06/13/16 1224    Clinical Impression Statement Pt tells me she has been in contact w/ DME vendor by email and they are trying to coordinate fitting ASAP. Pt verbalized understanding that thourough clinical assessment of fit and function must be completed within 10 days of delivery to meet criterion for remakes, if needed. Pt managing well between sessions and remains highly compliant w/ LE self care. She tolerates all aspects of manual therapy and demonstrates progress towards goals. Cont as per POC. Assess custom compression garments ASAP.   Rehab Potential Good   OT Frequency 2x / week   OT Duration 12 weeks   OT Treatment/Interventions Self-care/ADL training;Therapeutic exercise;Patient/family education;Manual Therapy;Manual lymph drainage;Other (comment);DME and/or AE instruction;Compression bandaging;Therapeutic  activities  skin care   OT Home Exercise Plan simple self MLD to BLE 1 x daily-full sequence; BLE lymphatic pumping ther ex 1 x daily- 10 reps each in sequence; full time (23/7)  short stretch compression wraps daily until custom garments delivered and fitted; flxitouch sequential pneumatic compression device on alternating legs daily and PRN; daily skin care   Consulted and Agree with Plan of Care Patient      Patient will benefit from skilled therapeutic intervention in order to improve the following deficits and impairments:  Decreased skin integrity, Decreased activity tolerance, Decreased knowledge of use of DME, Impaired flexibility, Decreased balance, Decreased mobility, Difficulty walking, Obesity, Increased edema, Pain, Abnormal gait, Decreased knowledge of precautions  Visit  Diagnosis: Lymphedema, not elsewhere classified    Problem List There are no active problems to display for this patient.   Andrey Spearman, MS, OTR/L, [facility] 06/13/16 12:31 PM  [place] MAIN [facility] SERVICES [address] [place], [place], [address] Phone: [phone]   Fax:  [phone]  Name: Danielle Lin MRN: [id number] Date of Birth: August 17, 1975

## 2016-06-14 ENCOUNTER — Encounter: Payer: Managed Care, Other (non HMO) | Admitting: Occupational Therapy

## 2016-06-15 ENCOUNTER — Ambulatory Visit: Payer: Managed Care, Other (non HMO) | Admitting: Occupational Therapy

## 2016-06-15 DIAGNOSIS — I890 Lymphedema, not elsewhere classified: Secondary | ICD-10-CM | POA: Diagnosis not present

## 2016-06-18 NOTE — Therapy (Signed)
[place] [facility] MAIN [facility] SERVICES [address] [place], [place], [address] Phone: [phone]   Fax:  [phone]  Occupational Therapy Treatment and Progress Report  Patient Details  Name: Danielle Lin MRN: [id number] Date of Birth: Sep 22, 1975 No Data Recorded  Encounter Date: 06/15/2016    Past Medical History:  Diagnosis Date  . Hypertension     Past Surgical History:  Procedure Laterality Date  . CARPAL TUNNEL RELEASE Bilateral   . CHOLECYSTECTOMY    . GASTROPLASTY DUODENAL SWITCH      There were no vitals filed for this visit.                    OT Treatments/Exercises (OP) - 06/18/16 0001      ADLs   ADL Education Given Yes                     OT Long Term Goals - 06/08/16 1152      OT LONG TERM GOAL #1   Title Lymphedema (LE) management/ self-care: Pt able to apply multi layered, gradient compression wraps with min caregiver assistance using proper techniques within 2 weeks to achieve optimal limb volume reduction.   Baseline dependent for LE self care   Time 2   Status Achieved     OT LONG TERM GOAL #2   Title Lymphedema (LE) management/ self-care:  Pt to achieve at least 10% LLE limb volume reductions bilaterally during Intensive CDT to limit LE progression, decrease infection and falls risk, to reduce pain/, and to improve safe ambulation and functional mobility.   Baseline dependent for LE self care   Time 12   Period Weeks   Status Partially Met     OT LONG TERM GOAL #3   Title Lymphedema (LE) management/ self-care:  Pt >/= 85 % compliant with all daily, LE self-care protocols for home program w/ needed level of caregiver assistance , including simple self-manual lymphatic drainage (MLD), skin care, lymphatic pumping the ex, skin care, and donning/ doffing compression wraps and garments o limit LE progression and further functional decline.     Baseline dependent for LE self care   Time 12   Period Weeks   Status Achieved     OT LONG TERM GOAL #4   Title Lymphedema (LE) management/ self-care:  Pt to tolerate daily compression wraps, garments and devices in keeping w/ prescribed wear regime within 1 week of issue date to progress and retain clinical and functional gains and to limit LE progression.   Baseline dependent for LE self care   Time 12   Period Weeks   Status Achieved     OT LONG TERM GOAL #5   Title Lymphedema (LE) self-care:  During Management Phase CDT Pt to sustain limb volume reductions achieved during Intensive Phase CDT within 5% utilizing LE self-care protocols, appropriate compression garments/ devices, and needed level of caregiver assistance.   Baseline dependent for LE self care   Time 6   Period Months   Status On-going             Patient will benefit from skilled therapeutic intervention in order to improve the following deficits and impairments:  Decreased skin integrity, Decreased activity tolerance, Decreased knowledge of use of DME, Impaired flexibility, Decreased balance, Decreased mobility, Difficulty walking, Obesity, Increased edema, Pain, Abnormal gait, Decreased knowledge of precautions  Visit Diagnosis: Lymphedema, not elsewhere classified    Problem List There are no active problems  to display for this patient.   Andrey Spearman, MS, OTR/L, [facility] 06/18/16 12:29 PM  [place] MAIN [facility] SERVICES [address] [place] [employer] [place], [place], [address] Phone: [phone]   Fax:  [phone]  Name: Danielle Lin MRN: [id number] Date of Birth: 1976/04/12

## 2016-06-20 ENCOUNTER — Ambulatory Visit: Payer: Managed Care, Other (non HMO) | Admitting: Occupational Therapy

## 2016-06-21 ENCOUNTER — Ambulatory Visit: Payer: Managed Care, Other (non HMO) | Admitting: Occupational Therapy

## 2016-06-21 ENCOUNTER — Encounter: Payer: Managed Care, Other (non HMO) | Admitting: Occupational Therapy

## 2016-06-21 DIAGNOSIS — I890 Lymphedema, not elsewhere classified: Secondary | ICD-10-CM

## 2016-06-21 NOTE — Therapy (Signed)
[place] MA[facility] SERVICES [address] [place], [place], [address] Phone: [phone]   Fax:  [phone]  Occupational Therapy Treatment  Patient Details  Name: Danielle Lin MRN: [id number] Date of Birth: Mar 29, 1976 No Data Recorded  Encounter Date: 06/21/2016      OT End of Session - 06/21/16 1059    Visit Number 41   Number of Visits 64   Date for OT Re-Evaluation 08/30/16   Authorization Type 39/72CDT to BLE. Visit 10 for 2018   Authorization Time Period additional 12 weeks starting 06/08/16   Authorization - Number of Visits 36   OT Start Time 0805   OT Stop Time 0910   OT Time Calculation (min) 65 min   Activity Tolerance Patient tolerated treatment well;No increased pain   Behavior During Therapy WFL for tasks assessed/performed      Past Medical History:  Diagnosis Date  . Hypertension     Past Surgical History:  Procedure Laterality Date  . CARPAL TUNNEL RELEASE Bilateral   . CHOLECYSTECTOMY    . GASTROPLASTY DUODENAL SWITCH      There were no vitals filed for this visit.      Subjective Assessment - 06/21/16 1057    Subjective  Pt presents for OT visit 41/72 visits ( visit 11 for 2018) to address sever, stage 4, BLE lipo-lymphedema. Pt brings compression wraps to clinic. Pt cancelled last OT session 2/2 illness. She reports that she is still feeling unwell today and called out of work.   Patient is accompained by: Family member   Pertinent History SIPPS sx 04/25/15 w/ weight at 306 lbs. Current weight 212.6 lbs. HTN, Asthma, OSA ( uses CPAP) B CTR   Limitations BLE chronic leg pain and swelling, L>R, difficulty walking, decreased standing tolerance, decreased activity tolerance, difficulty w/ functional mobility/ transfers   Patient Stated Goals decrease leg swelling and pain and keep it from getting worse.   Currently in Pain? No/denies   Pain Onset More than a month ago                       OT Treatments/Exercises (OP) - 06/21/16 0001      ADLs   ADL Education Given Yes                OT Education - 06/21/16 1058    Education provided Yes   Education Details Continued skilled Pt/caregiver education  And LE ADL training throughout visit for lymphedema self care/ home program, including compression wrapping, compression garment and device wear/care, lymphatic pumping ther ex, simple self-MLD, and skin care. Discussed progress towards goals.   Person(s) Educated Patient   Methods Explanation;Demonstration   Comprehension Verbalized understanding             OT Long Term Goals - 06/08/16 1152      OT LONG TERM GOAL #1   Title Lymphedema (LE) management/ self-care: Pt able to apply multi layered, gradient compression wraps with min caregiver assistance using proper techniques within 2 weeks to achieve optimal limb volume reduction.   Baseline dependent for LE self care   Time 2   Status Achieved     OT LONG TERM GOAL #2   Title Lymphedema (LE) management/ self-care:  Pt to achieve at least 10% LLE limb volume reductions bilaterally during Intensive CDT to limit LE progression, decrease infection and falls risk, to reduce pain/, and to improve safe ambulation and functional mobility.  Baseline dependent for LE self care   Time 12   Period Weeks   Status Partially Met     OT LONG TERM GOAL #3   Title Lymphedema (LE) management/ self-care:  Pt >/= 85 % compliant with all daily, LE self-care protocols for home program w/ needed level of caregiver assistance , including simple self-manual lymphatic drainage (MLD), skin care, lymphatic pumping the ex, skin care, and donning/ doffing compression wraps and garments o limit LE progression and further functional decline.     Baseline dependent for LE self care   Time 12   Period Weeks   Status Achieved     OT LONG TERM GOAL #4   Title Lymphedema (LE) management/ self-care:  Pt to tolerate daily compression wraps,  garments and devices in keeping w/ prescribed wear regime within 1 week of issue date to progress and retain clinical and functional gains and to limit LE progression.   Baseline dependent for LE self care   Time 12   Period Weeks   Status Achieved     OT LONG TERM GOAL #5   Title Lymphedema (LE) self-care:  During Management Phase CDT Pt to sustain limb volume reductions achieved during Intensive Phase CDT within 5% utilizing LE self-care protocols, appropriate compression garments/ devices, and needed level of caregiver assistance.   Baseline dependent for LE self care   Time 6   Period Months   Status On-going               Plan - 06/21/16 1100    Clinical Impression Statement Provided MLD, skin care and BLE compression wrapping per CDT protocol. Pt tolerating and participating in all aspects of therapy. Awaiting garment fitting next week. Modoification of fit is expected because of delay in fitting since measured 2 .5 months ago.Cont as per POC.   Rehab Potential Good   OT Frequency 2x / week   OT Duration 12 weeks   OT Treatment/Interventions Self-care/ADL training;Therapeutic exercise;Patient/family education;Manual Therapy;Manual lymph drainage;Other (comment);DME and/or AE instruction;Compression bandaging;Therapeutic activities  skin care   OT Home Exercise Plan simple self MLD to BLE 1 x daily-full sequence; BLE lymphatic pumping ther ex 1 x daily- 10 reps each in sequence; full time (23/7)  short stretch compression wraps daily until custom garments delivered and fitted; flxitouch sequential pneumatic compression device on alternating legs daily and PRN; daily skin care   Consulted and Agree with Plan of Care Patient      Patient will benefit from skilled therapeutic intervention in order to improve the following deficits and impairments:  Decreased skin integrity, Decreased activity tolerance, Decreased knowledge of use of DME, Impaired flexibility, Decreased balance,  Decreased mobility, Difficulty walking, Obesity, Increased edema, Pain, Abnormal gait, Decreased knowledge of precautions  Visit Diagnosis: Lymphedema, not elsewhere classified    Problem List There are no active problems to display for this patient.   Andrey Spearman, MS, OTR/L, [facility] 06/21/16 11:03 AM   [place] MAIN [facility] SERVICES [address] [place], [place], [address] Phone: [phone]   Fax:  [phone]  Name: Danielle Lin MRN: [id number] Date of Birth: [birth year]

## 2016-06-22 ENCOUNTER — Encounter: Payer: Managed Care, Other (non HMO) | Admitting: Occupational Therapy

## 2016-06-25 ENCOUNTER — Ambulatory Visit: Payer: Managed Care, Other (non HMO) | Admitting: Occupational Therapy

## 2016-06-25 DIAGNOSIS — I890 Lymphedema, not elsewhere classified: Secondary | ICD-10-CM | POA: Diagnosis not present

## 2016-06-25 NOTE — Therapy (Signed)
[place] MAIN [facility] SERVICES [address] [place], [place], [address] Phone: [phone]   Fax:  [phone]  Occupational Therapy Treatment  Patient Details  Name: Danielle Lin MRN: [id number] Date of Birth: [birth year] No Data Recorded  Encounter Date: 06/25/2016      OT End of Session - 06/25/16 0913    Visit Number 42   Number of Visits 60   Date for OT Re-Evaluation 08/30/16   Authorization Type 39/72CDT to BLE. Visit 10 for 2018   Authorization Time Period additional 12 weeks starting 06/08/16   Authorization - Number of Visits 17   OT Start Time 0805   OT Stop Time 0905   OT Time Calculation (min) 60 min   Activity Tolerance Patient tolerated treatment well;No increased pain   Behavior During Therapy WFL for tasks assessed/performed      Past Medical History:  Diagnosis Date  . Hypertension     Past Surgical History:  Procedure Laterality Date  . CARPAL TUNNEL RELEASE Bilateral   . CHOLECYSTECTOMY    . GASTROPLASTY DUODENAL SWITCH      There were no vitals filed for this visit.      Subjective Assessment - 06/25/16 0909    Subjective  Pt presents for OT visit 42/72 visits ( visit 11 for 2018) to address sever, stage 4, BLE lipo-lymphedema.. Pt is accompanied by her mother today. [place] "My legs are a little sore."   Patient is accompained by: Family member   Pertinent History SIPPS sx 04/25/15 w/ weight at 306 lbs. Current weight 212.6 lbs. HTN, Asthma, OSA ( uses CPAP) B CTR   Limitations BLE chronic leg pain and swelling, L>R, difficulty walking, decreased standing tolerance, decreased activity tolerance, difficulty w/ functional mobility/ transfers   Patient Stated Goals decrease leg swelling and pain and keep it from getting worse.   Currently in Pain? Yes   Pain Score --  not rated numerically   Pain Location Leg   Pain Orientation Right;Left   Pain  Descriptors / Indicators Tender;Sore   Pain Onset More than a month ago                      OT Treatments/Exercises (OP) - 06/25/16 0001      ADLs   ADL Education Given Yes     Manual Therapy   Manual Therapy Edema management;Compression Bandaging   Edema Management skin care to LLE as established   Manual Lymphatic Drainage (MLD) MLD to LLE as established.   Compression Bandaging BLE wraped from ankle to groin as established.                 OT Education - 06/25/16 0911    Education provided Yes   Education Details Continued skilled Pt/caregiver education  And LE ADL training throughout visit for lymphedema self care/ home program, including compression wrapping, compression garment and device wear/care, lymphatic pumping ther ex, simple self-MLD, and skin care. Discussed progress towards goals.   Person(s) Educated Patient;Parent(s)   Methods Explanation   Comprehension Verbalized understanding             OT Long Term Goals - 06/25/16 0917      OT LONG TERM GOAL #1   Title Lymphedema (LE) management/ self-care: Pt able to apply multi layered, gradient compression wraps with min caregiver assistance using proper techniques within 2 weeks to achieve optimal limb  volume reduction.   Baseline dependent for LE self care   Time 2   Status Achieved     OT LONG TERM GOAL #2   Title Lymphedema (LE) management/ self-care:  Pt to achieve at least 10% LLE limb volume reductions bilaterally during Intensive CDT to limit LE progression, decrease infection and falls risk, to reduce pain/, and to improve safe ambulation and functional mobility.   Baseline dependent for LE self care   Time 12   Period Weeks   Status Partially Met     OT LONG TERM GOAL #3   Title Lymphedema (LE) management/ self-care:  Pt >/= 85 % compliant with all daily, LE self-care protocols for home program w/ needed level of caregiver assistance , including simple self-manual lymphatic  drainage (MLD), skin care, lymphatic pumping the ex, skin care, and donning/ doffing compression wraps and garments o limit LE progression and further functional decline.     Baseline dependent for LE self care   Time 12   Period Weeks   Status Achieved     OT LONG TERM GOAL #4   Title Lymphedema (LE) management/ self-care:  Pt to tolerate daily compression wraps, garments and devices in keeping w/ prescribed wear regime within 1 week of issue date to progress and retain clinical and functional gains and to limit LE progression.   Baseline dependent for LE self care   Time 12   Period Weeks   Status Achieved     OT LONG TERM GOAL #5   Title Lymphedema (LE) self-care:  During Management Phase CDT Pt to sustain limb volume reductions achieved during Intensive Phase CDT within 5% utilizing LE self-care protocols, appropriate compression garments/ devices, and needed level of caregiver assistance.   Baseline dependent for LE self care   Time 6   Period Months   Status On-going               Plan - 06/25/16 2025    Clinical Impression Statement Pt had no difficulty tolerating compression , MLD and skin care today. Despite sioreness from walking more than usual, Pt's legs are firm and swelling is well controlled today.  Discussed option of adding NuStep to clinical regime to facilitate increased lymphatic mobility after MLD. Pt agreed to consider. Cont as per POC. Hopefully garments will be fit this week.   Rehab Potential Good   OT Frequency 2x / week   OT Duration 12 weeks   OT Treatment/Interventions Self-care/ADL training;Therapeutic exercise;Patient/family education;Manual Therapy;Manual lymph drainage;Other (comment);DME and/or AE instruction;Compression bandaging;Therapeutic activities  skin care   OT Home Exercise Plan simple self MLD to BLE 1 x daily-full sequence; BLE lymphatic pumping ther ex 1 x daily- 10 reps each in sequence; full time (23/7)  short stretch compression  wraps daily until custom garments delivered and fitted; flxitouch sequential pneumatic compression device on alternating legs daily and PRN; daily skin care   Consulted and Agree with Plan of Care Patient      Patient will benefit from skilled therapeutic intervention in order to improve the following deficits and impairments:  Decreased skin integrity, Decreased activity tolerance, Decreased knowledge of use of DME, Impaired flexibility, Decreased balance, Decreased mobility, Difficulty walking, Obesity, Increased edema, Pain, Abnormal gait, Decreased knowledge of precautions  Visit Diagnosis: Lymphedema, not elsewhere classified    Problem List There are no active problems to display for this patient.  Andrey Spearman, MS, OTR/L, [facility] 06/25/16 9:18 AM  [place] [facility]  SERVICES [place], [place], [address] Phone: [phone]   Fax:  [phone]  Name: Danielle Lin MRN: [id number] Date of Birth: 1976-02-11

## 2016-06-29 ENCOUNTER — Ambulatory Visit: Payer: Managed Care, Other (non HMO) | Attending: Family Medicine | Admitting: Occupational Therapy

## 2016-06-29 DIAGNOSIS — I890 Lymphedema, not elsewhere classified: Secondary | ICD-10-CM | POA: Diagnosis present

## 2016-06-29 NOTE — Therapy (Signed)
[place] MAIN [facility] SERVICES [address] [place], [place], [address] Phone: [phone]   Fax:  [phone]  Occupational Therapy Treatment  Patient Details  Name: Danielle Lin MRN: [id number] Date of Birth: 09/07/1975 No Data Recorded  Encounter Date: 06/29/2016      OT End of Session - 06/29/16 1125    Visit Number 65   Number of Visits 66   Date for OT Re-Evaluation 08/30/16   Authorization Type 39/72CDT to BLE. Visit 10 for 2018   Authorization Time Period additional 12 weeks starting 06/08/16   OT Start Time 0810   OT Stop Time 0915   OT Time Calculation (min) 65 min   Activity Tolerance Patient tolerated treatment well;No increased pain   Behavior During Therapy WFL for tasks assessed/performed      Past Medical History:  Diagnosis Date  . Hypertension     Past Surgical History:  Procedure Laterality Date  . CARPAL TUNNEL RELEASE Bilateral   . CHOLECYSTECTOMY    . GASTROPLASTY DUODENAL SWITCH      There were no vitals filed for this visit.      Subjective Assessment - 06/29/16 1117    Subjective  Pt presents for OT visit 43/72 visits ( visit 11 for 2018) to address sever, stage 4, BLE lipo-lymphedema.. Pt 10 min late for 60 min appointment. She is unaccompanied today. Pt in low spirits today.   Patient is accompained by: Family member   Pertinent History SIPPS sx 04/25/15 w/ weight at 306 lbs. Current weight 212.6 lbs. HTN, Asthma, OSA ( uses CPAP) B CTR   Limitations BLE chronic leg pain and swelling, L>R, difficulty walking, decreased standing tolerance, decreased activity tolerance, difficulty w/ functional mobility/ transfers   Patient Stated Goals decrease leg swelling and pain and keep it from getting worse.   Currently in Pain? No/denies   Pain Onset More than a month ago                              OT Education - 06/29/16 1118    Education provided Yes   Education Details Pt edu re  alternative DME vendor who is in network w/ her insurance. Pt willing to try to obtaon garments and HOS devices thru this remote process. OT provided printed handouts and forms for process.   Person(s) Educated Patient   Methods Explanation;Handout   Comprehension Verbalized understanding             OT Long Term Goals - 06/25/16 0917      OT LONG TERM GOAL #1   Title Lymphedema (LE) management/ self-care: Pt able to apply multi layered, gradient compression wraps with min caregiver assistance using proper techniques within 2 weeks to achieve optimal limb volume reduction.   Baseline dependent for LE self care   Time 2   Status Achieved     OT LONG TERM GOAL #2   Title Lymphedema (LE) management/ self-care:  Pt to achieve at least 10% LLE limb volume reductions bilaterally during Intensive CDT to limit LE progression, decrease infection and falls risk, to reduce pain/, and to improve safe ambulation and functional mobility.   Baseline dependent for LE self care   Time 12   Period Weeks   Status Partially Met     OT LONG TERM GOAL #3   Title Lymphedema (LE) management/ self-care:  Pt >/= 85 % compliant with all daily, LE self-care  protocols for home program w/ needed level of caregiver assistance , including simple self-manual lymphatic drainage (MLD), skin care, lymphatic pumping the ex, skin care, and donning/ doffing compression wraps and garments o limit LE progression and further functional decline.     Baseline dependent for LE self care   Time 12   Period Weeks   Status Achieved     OT LONG TERM GOAL #4   Title Lymphedema (LE) management/ self-care:  Pt to tolerate daily compression wraps, garments and devices in keeping w/ prescribed wear regime within 1 week of issue date to progress and retain clinical and functional gains and to limit LE progression.   Baseline dependent for LE self care   Time 12   Period Weeks   Status Achieved     OT LONG TERM GOAL #5   Title  Lymphedema (LE) self-care:  During Management Phase CDT Pt to sustain limb volume reductions achieved during Intensive Phase CDT within 5% utilizing LE self-care protocols, appropriate compression garments/ devices, and needed level of caregiver assistance.   Baseline dependent for LE self care   Time 6   Period Months   Status On-going               Plan - 06/29/16 1126    Clinical Impression Statement Pt in low spirits today so session tone kept low key. Pt tolerated MLD, skin care and BLE compression wrapping without difficulty. Pt in agreement with plan to explore alternative, in-network DME vendor, despite remote location, since local vendor left VM saying he was returning her garments to manufacturer.   Rehab Potential Good   OT Frequency 2x / week   OT Duration 12 weeks   OT Treatment/Interventions Self-care/ADL training;Therapeutic exercise;Patient/family education;Manual Therapy;Manual lymph drainage;Other (comment);DME and/or AE instruction;Compression bandaging;Therapeutic activities  skin care   OT Home Exercise Plan simple self MLD to BLE 1 x daily-full sequence; BLE lymphatic pumping ther ex 1 x daily- 10 reps each in sequence; full time (23/7)  short stretch compression wraps daily until custom garments delivered and fitted; flxitouch sequential pneumatic compression device on alternating legs daily and PRN; daily skin care   Consulted and Agree with Plan of Care Patient      Patient will benefit from skilled therapeutic intervention in order to improve the following deficits and impairments:  Decreased skin integrity, Decreased activity tolerance, Decreased knowledge of use of DME, Impaired flexibility, Decreased balance, Decreased mobility, Difficulty walking, Obesity, Increased edema, Pain, Abnormal gait, Decreased knowledge of precautions  Visit Diagnosis: Lymphedema, not elsewhere classified    Problem List There are no active problems to display for this  patient.   Andrey Spearman, MS, OTR/L, [facility] 06/29/16 11:33 AM   [place] MAIN [facility] SERVICES [address] [place], [place], [address] Phone: [phone]   Fax:  [phone]  Name: Lalania Haseman MRN: [id number] Date of Birth: Nov 16, 1975

## 2016-07-11 ENCOUNTER — Ambulatory Visit: Payer: Managed Care, Other (non HMO) | Admitting: Occupational Therapy

## 2016-07-11 DIAGNOSIS — I890 Lymphedema, not elsewhere classified: Secondary | ICD-10-CM | POA: Diagnosis not present

## 2016-07-11 NOTE — Therapy (Signed)
[place] MAIN [facility] SERVICES [address] [place], [place], [address] Phone: [phone]   Fax:  [phone]  Occupational Therapy Treatment  Patient Details  Name: Doraine Schexnider MRN: [id number] Date of Birth: 26-Jun-1975 No Data Recorded  Encounter Date: 07/11/2016      OT End of Session - 07/11/16 0908    Visit Number 60   Number of Visits 32   Date for OT Re-Evaluation 08/30/16   Authorization Type 39/72CDT to BLE. Visit 10 for 2018   Authorization Time Period additional 12 weeks starting 06/08/16   OT Start Time 0802   OT Stop Time 0902   OT Time Calculation (min) 60 min   Activity Tolerance Patient tolerated treatment well;No increased pain   Behavior During Therapy WFL for tasks assessed/performed      Past Medical History:  Diagnosis Date  . Hypertension     Past Surgical History:  Procedure Laterality Date  . CARPAL TUNNEL RELEASE Bilateral   . CHOLECYSTECTOMY    . GASTROPLASTY DUODENAL SWITCH      There were no vitals filed for this visit.      Subjective Assessment - 07/11/16 0906    Subjective  Pt presents for OT visit 43/72 visits ( visit 11 for 2018) to address sever, stage 4, BLE lipo-lymphedema.. Pt 10 min late for 60 min appointment. She is unaccompanied today. Pt in low spirits today.   Patient is accompained by: Family member   Pertinent History SIPPS sx 04/25/15 w/ weight at 306 lbs. Current weight 212.6 lbs. HTN, Asthma, OSA ( uses CPAP) B CTR   Limitations BLE chronic leg pain and swelling, L>R, difficulty walking, decreased standing tolerance, decreased activity tolerance, difficulty w/ functional mobility/ transfers   Patient Stated Goals decrease leg swelling and pain and keep it from getting worse.   Currently in Pain? No/denies   Pain Onset More than a month ago                      OT Treatments/Exercises (OP) - 07/11/16 0001      ADLs   ADL Education Given Yes     Manual  Therapy   Manual Therapy Edema management;Compression Bandaging;Manual Lymphatic Drainage (MLD)  skin cARE rle   Manual Lymphatic Drainage (MLD) MLD to LLE as established.   Compression Bandaging BLE wraped from ankle to groin as established.                 OT Education - 07/11/16 0907    Education provided Yes   Education Details Continued skilled Pt/caregiver education  And LE ADL training throughout visit for lymphedema self care/ home program, including compression wrapping, compression garment and device wear/care, lymphatic pumping ther ex, simple self-MLD, and skin care. Discussed progress towards goals.   Person(s) Educated Patient   Methods Explanation   Comprehension Verbalized understanding             OT Long Term Goals - 06/25/16 0917      OT LONG TERM GOAL #1   Title Lymphedema (LE) management/ self-care: Pt able to apply multi layered, gradient compression wraps with min caregiver assistance using proper techniques within 2 weeks to achieve optimal limb volume reduction.   Baseline dependent for LE self care   Time 2   Status Achieved     OT LONG TERM GOAL #2   Title Lymphedema (LE) management/ self-care:  Pt to achieve at least 10% LLE limb volume reductions  bilaterally during Intensive CDT to limit LE progression, decrease infection and falls risk, to reduce pain/, and to improve safe ambulation and functional mobility.   Baseline dependent for LE self care   Time 12   Period Weeks   Status Partially Met     OT LONG TERM GOAL #3   Title Lymphedema (LE) management/ self-care:  Pt >/= 85 % compliant with all daily, LE self-care protocols for home program w/ needed level of caregiver assistance , including simple self-manual lymphatic drainage (MLD), skin care, lymphatic pumping the ex, skin care, and donning/ doffing compression wraps and garments o limit LE progression and further functional decline.     Baseline dependent for LE self care   Time 12    Period Weeks   Status Achieved     OT LONG TERM GOAL #4   Title Lymphedema (LE) management/ self-care:  Pt to tolerate daily compression wraps, garments and devices in keeping w/ prescribed wear regime within 1 week of issue date to progress and retain clinical and functional gains and to limit LE progression.   Baseline dependent for LE self care   Time 12   Period Weeks   Status Achieved     OT LONG TERM GOAL #5   Title Lymphedema (LE) self-care:  During Management Phase CDT Pt to sustain limb volume reductions achieved during Intensive Phase CDT within 5% utilizing LE self-care protocols, appropriate compression garments/ devices, and needed level of caregiver assistance.   Baseline dependent for LE self care   Time 6   Period Months   Status On-going               Plan - 07/11/16 0909    Clinical Impression Statement Discussed trsansition to Management Phase CDT with Pt today as Intensive Phase OT rx will end once garment / device fittiung is complete. Pt had difficulty managing daily LE self care over the last week due to emotional upset. Pt typically manages quite well, so emphasis on encouragement and support throughout session. Measurements for custom compression garments and HOS devices to be completed in clinic w/ this therapist this coming Friday. Cont as per POC.   Rehab Potential Good   OT Frequency 2x / week   OT Duration 12 weeks   OT Treatment/Interventions Self-care/ADL training;Therapeutic exercise;Patient/family education;Manual Therapy;Manual lymph drainage;Other (comment);DME and/or AE instruction;Compression bandaging;Therapeutic activities  skin care   OT Home Exercise Plan simple self MLD to BLE 1 x daily-full sequence; BLE lymphatic pumping ther ex 1 x daily- 10 reps each in sequence; full time (23/7)  short stretch compression wraps daily until custom garments delivered and fitted; flxitouch sequential pneumatic compression device on alternating legs daily  and PRN; daily skin care   Consulted and Agree with Plan of Care Patient      Patient will benefit from skilled therapeutic intervention in order to improve the following deficits and impairments:  Decreased skin integrity, Decreased activity tolerance, Decreased knowledge of use of DME, Impaired flexibility, Decreased balance, Decreased mobility, Difficulty walking, Obesity, Increased edema, Pain, Abnormal gait, Decreased knowledge of precautions  Visit Diagnosis: Lymphedema, not elsewhere classified    Problem List There are no active problems to display for this patient.  Andrey Spearman, MS, OTR/L, [facility] 07/11/16 9:13 AM   [place] MAIN [facility] SERVICES [address] [place], [place], [address] Phone: [phone]   Fax:  [phone]  Name: Alba Perillo MRN: [id number] Date of Birth: 1975-09-26

## 2016-07-13 ENCOUNTER — Ambulatory Visit: Payer: Managed Care, Other (non HMO) | Admitting: Occupational Therapy

## 2016-07-13 DIAGNOSIS — I890 Lymphedema, not elsewhere classified: Secondary | ICD-10-CM | POA: Diagnosis not present

## 2016-07-13 NOTE — Therapy (Deleted)
[place] MAIN [facility] SERVICES [address] [place], [place], [address] Phone: [phone]   Fax:  [phone]  Occupational Therapy Evaluation  Patient Details  Name: Danielle Lin MRN: [id number] Date of Birth: Nov 06, 1975 No Data Recorded  Encounter Date: 07/13/2016      OT End of Session - 07/13/16 1501    Visit Number 45   Number of Visits 72   Date for OT Re-Evaluation 08/30/16   Authorization Type 39/72CDT to BLE. Visit 10 for 2018   Authorization Time Period additional 12 weeks starting 06/08/16   OT Start Time 0103   OT Stop Time 0255   OT Time Calculation (min) 112 min   Activity Tolerance Patient tolerated treatment well;No increased pain   Behavior During Therapy WFL for tasks assessed/performed      Past Medical History:  Diagnosis Date  . Hypertension     Past Surgical History:  Procedure Laterality Date  . CARPAL TUNNEL RELEASE Bilateral   . CHOLECYSTECTOMY    . GASTROPLASTY DUODENAL SWITCH      There were no vitals filed for this visit.      Subjective Assessment - 07/13/16 1500    Subjective  Pt presents for OT visit 44/72 visits ( visit 11 for 2018) to address sever, stage 4, BLE lipo-lymphedema.. Pt has no new complaints.   Patient is accompained by: Family member   Pertinent History SIPPS sx 04/25/15 w/ weight at 306 lbs. Current weight 212.6 lbs. HTN, Asthma, OSA ( uses CPAP) B CTR   Limitations BLE chronic leg pain and swelling, L>R, difficulty walking, decreased standing tolerance, decreased activity tolerance, difficulty w/ functional mobility/ transfers   Patient Stated Goals decrease leg swelling and pain and keep it from getting worse.   Currently in Pain? No/denies   Pain Onset More than a month ago                     OT Treatments/Exercises (OP) - 07/13/16 0001      ADLs   ADL Education Given Yes     Manual Therapy   Manual therapy comments completed anatomical measurements  for 3 peice daytime compression garments and thigh high HOS devices               OT Education - 07/13/16 1501    Education provided Yes   Education Details Continued skilled Pt/caregiver education  And LE ADL training throughout visit for lymphedema self care/ home program, including compression wrapping, compression garment and device wear/care, lymphatic pumping ther ex, simple self-MLD, and skin care. Discussed progress towards goals.   Person(s) Educated Patient   Methods Explanation   Comprehension Verbalized understanding             OT Long Term Goals - 06/25/16 0917      OT LONG TERM GOAL #1   Title Lymphedema (LE) management/ self-care: Pt able to apply multi layered, gradient compression wraps with min caregiver assistance using proper techniques within 2 weeks to achieve optimal limb volume reduction.   Baseline dependent for LE self care   Time 2   Status Achieved     OT LONG TERM GOAL #2   Title Lymphedema (LE) management/ self-care:  Pt to achieve at least 10% LLE limb volume reductions bilaterally during Intensive CDT to limit LE progression, decrease infection and falls risk, to reduce pain/, and to improve safe ambulation and functional mobility.   Baseline dependent for LE self care  Time 12   Period Weeks   Status Partially Met     OT LONG TERM GOAL #3   Title Lymphedema (LE) management/ self-care:  Pt >/= 85 % compliant with all daily, LE self-care protocols for home program w/ needed level of caregiver assistance , including simple self-manual lymphatic drainage (MLD), skin care, lymphatic pumping the ex, skin care, and donning/ doffing compression wraps and garments o limit LE progression and further functional decline.     Baseline dependent for LE self care   Time 12   Period Weeks   Status Achieved     OT LONG TERM GOAL #4   Title Lymphedema (LE) management/ self-care:  Pt to tolerate daily compression wraps, garments and devices in keeping  w/ prescribed wear regime within 1 week of issue date to progress and retain clinical and functional gains and to limit LE progression.   Baseline dependent for LE self care   Time 12   Period Weeks   Status Achieved     OT LONG TERM GOAL #5   Title Lymphedema (LE) self-care:  During Management Phase CDT Pt to sustain limb volume reductions achieved during Intensive Phase CDT within 5% utilizing LE self-care protocols, appropriate compression garments/ devices, and needed level of caregiver assistance.   Baseline dependent for LE self care   Time 6   Period Months   Status On-going               Plan - 07/13/16 1502    Clinical Impression Statement Completed anatomical measurements for Elvarex knee highs, ccl 3, and Elvarex thigh high capri (B-T) at ccl2. Completed BLE A-G measurements for Elvarex HOS Relax-ccl2. Decrease frequency to 1 x weekly until garments / device fitting. Pt in agreement.   Rehab Potential Good   OT Frequency 1x / week   OT Duration 12 weeks   OT Treatment/Interventions Self-care/ADL training;Therapeutic exercise;Patient/family education;Manual Therapy;Manual lymph drainage;Other (comment);DME and/or AE instruction;Compression bandaging;Therapeutic activities  skin care   OT Home Exercise Plan simple self MLD to BLE 1 x daily-full sequence; BLE lymphatic pumping ther ex 1 x daily- 10 reps each in sequence; full time (23/7)  short stretch compression wraps daily until custom garments delivered and fitted; flxitouch sequential pneumatic compression device on alternating legs daily and PRN; daily skin care   Consulted and Agree with Plan of Care Patient      Patient will benefit from skilled therapeutic intervention in order to improve the following deficits and impairments:  Decreased skin integrity, Decreased activity tolerance, Decreased knowledge of use of DME, Impaired flexibility, Decreased balance, Decreased mobility, Difficulty walking, Obesity, Increased  edema, Pain, Abnormal gait, Decreased knowledge of precautions  Visit Diagnosis: Lymphedema, not elsewhere classified    Problem List There are no active problems to display for this patient.   Ansel Bong 07/13/2016, 3:04 PM  [place] MAIN [facility] SERVICES [address] [place], [place], [address] Phone: [phone]   Fax:  [phone]  Name: Danielle Lin MRN: [id number] Date of Birth: [birth year]

## 2016-07-13 NOTE — Therapy (Signed)
[facility] MAIN [facility] SERVICES [address] [place], [place], [address] Phone: [phone]   Fax:  [phone]  Occupational Therapy Treatment  Patient Details  Name: Danielle Lin MRN: [id number] Date of Birth: [birth year] No Data Recorded  Encounter Date: 07/13/2016      OT End of Session - 07/13/16 1501    Visit Number 45   Number of Visits 72   Date for OT Re-Evaluation 08/30/16   Authorization Type 39/72CDT to BLE. Visit 10 for 2018   Authorization Time Period additional 12 weeks starting 06/08/16   OT Start Time 0103   OT Stop Time 0255   OT Time Calculation (min) 112 min   Activity Tolerance Patient tolerated treatment well;No increased pain   Behavior During Therapy WFL for tasks assessed/performed      Past Medical History:  Diagnosis Date  . Hypertension     Past Surgical History:  Procedure Laterality Date  . CARPAL TUNNEL RELEASE Bilateral   . CHOLECYSTECTOMY    . GASTROPLASTY DUODENAL SWITCH      There were no vitals filed for this visit.      Subjective Assessment - 07/13/16 1500    Subjective  Pt presents for OT visit 44/72 visits ( visit 11 for 2018) to address sever, stage 4, BLE lipo-lymphedema.. Pt has no new complaints.   Patient is accompained by: Family member   Pertinent History SIPPS sx 04/25/15 w/ weight at 306 lbs. Current weight 212.6 lbs. HTN, Asthma, OSA ( uses CPAP) B CTR   Limitations BLE chronic leg pain and swelling, L>R, difficulty walking, decreased standing tolerance, decreased activity tolerance, difficulty w/ functional mobility/ transfers   Patient Stated Goals decrease leg swelling and pain and keep it from getting worse.   Currently in Pain? No/denies   Pain Onset More than a month ago                      OT Treatments/Exercises (OP) - 07/13/16 0001      ADLs   ADL Education Given Yes     Manual Therapy   Manual therapy comments completed anatomical measurements  for 3 peice daytime compression garments and thigh high HOS devices                OT Education - 07/13/16 1501    Education provided Yes   Education Details Continued skilled Pt/caregiver education  And LE ADL training throughout visit for lymphedema self care/ home program, including compression wrapping, compression garment and device wear/care, lymphatic pumping ther ex, simple self-MLD, and skin care. Discussed progress towards goals.   Person(s) Educated Patient   Methods Explanation   Comprehension Verbalized understanding             OT Long Term Goals - 06/25/16 0917      OT LONG TERM GOAL #1   Title Lymphedema (LE) management/ self-care: Pt able to apply multi layered, gradient compression wraps with min caregiver assistance using proper techniques within 2 weeks to achieve optimal limb volume reduction.   Baseline dependent for LE self care   Time 2   Status Achieved     OT LONG TERM GOAL #2   Title Lymphedema (LE) management/ self-care:  Pt to achieve at least 10% LLE limb volume reductions bilaterally during Intensive CDT to limit LE progression, decrease infection and falls risk, to reduce pain/, and to improve safe ambulation and functional mobility.   Baseline dependent for LE self  care   Time 12   Period Weeks   Status Partially Met     OT LONG TERM GOAL #3   Title Lymphedema (LE) management/ self-care:  Pt >/= 85 % compliant with all daily, LE self-care protocols for home program w/ needed level of caregiver assistance , including simple self-manual lymphatic drainage (MLD), skin care, lymphatic pumping the ex, skin care, and donning/ doffing compression wraps and garments o limit LE progression and further functional decline.     Baseline dependent for LE self care   Time 12   Period Weeks   Status Achieved     OT LONG TERM GOAL #4   Title Lymphedema (LE) management/ self-care:  Pt to tolerate daily compression wraps, garments and devices in keeping  w/ prescribed wear regime within 1 week of issue date to progress and retain clinical and functional gains and to limit LE progression.   Baseline dependent for LE self care   Time 12   Period Weeks   Status Achieved     OT LONG TERM GOAL #5   Title Lymphedema (LE) self-care:  During Management Phase CDT Pt to sustain limb volume reductions achieved during Intensive Phase CDT within 5% utilizing LE self-care protocols, appropriate compression garments/ devices, and needed level of caregiver assistance.   Baseline dependent for LE self care   Time 6   Period Months   Status On-going               Plan - 07/13/16 1502    Clinical Impression Statement Completed anatomical measurements for Elvarex knee highs, ccl 3, and Elvarex thigh high capri (B-T) at ccl2. Completed BLE A-G measurements for Elvarex HOS Relax-ccl2. Decrease frequency to 1 x weekly until garments / device fitting. Pt in agreement.   Rehab Potential Good   OT Frequency 1x / week   OT Duration 12 weeks   OT Treatment/Interventions Self-care/ADL training;Therapeutic exercise;Patient/family education;Manual Therapy;Manual lymph drainage;Other (comment);DME and/or AE instruction;Compression bandaging;Therapeutic activities  skin care   OT Home Exercise Plan simple self MLD to BLE 1 x daily-full sequence; BLE lymphatic pumping ther ex 1 x daily- 10 reps each in sequence; full time (23/7)  short stretch compression wraps daily until custom garments delivered and fitted; flxitouch sequential pneumatic compression device on alternating legs daily and PRN; daily skin care   Consulted and Agree with Plan of Care Patient      Patient will benefit from skilled therapeutic intervention in order to improve the following deficits and impairments:  Decreased skin integrity, Decreased activity tolerance, Decreased knowledge of use of DME, Impaired flexibility, Decreased balance, Decreased mobility, Difficulty walking, Obesity, Increased  edema, Pain, Abnormal gait, Decreased knowledge of precautions  Visit Diagnosis: Lymphedema, not elsewhere classified    Problem List There are no active problems to display for this patient.   Andrey Spearman, MS, OTR/L, [facility] 07/13/16 3:05 PM  [place] MAIN [facility] SERVICES [address] [place], [place], [address] Phone: [phone]   Fax:  [phone]  Name: Danielle Lin MRN: [id number] Date of Birth: [birth year]

## 2016-07-18 ENCOUNTER — Ambulatory Visit: Payer: Managed Care, Other (non HMO) | Admitting: Occupational Therapy

## 2016-07-25 ENCOUNTER — Encounter: Payer: Managed Care, Other (non HMO) | Admitting: Occupational Therapy

## 2016-07-27 ENCOUNTER — Ambulatory Visit: Payer: Managed Care, Other (non HMO) | Admitting: Occupational Therapy

## 2016-07-27 DIAGNOSIS — I890 Lymphedema, not elsewhere classified: Secondary | ICD-10-CM | POA: Diagnosis not present

## 2016-07-27 NOTE — Therapy (Signed)
[place] MAIN [facility] SERVICES [address] [place], [place], [address] Phone: [phone]   Fax:  [phone]  Occupational Therapy Treatment  Patient Details  Name: Danielle Lin MRN: [id number] Date of Birth: [birth year] No Data Recorded  Encounter Date: 07/27/2016      OT End of Session - 07/27/16 1216    Visit Number 12   Number of Visits 57   Date for OT Re-Evaluation 08/30/16   Authorization Type 39/72CDT to BLE. Visit 10 for 2018   Authorization Time Period additional 12 weeks starting 06/08/16   OT Start Time 0808   OT Stop Time 0910   OT Time Calculation (min) 62 min   Activity Tolerance Patient tolerated treatment well;No increased pain   Behavior During Therapy WFL for tasks assessed/performed      Past Medical History:  Diagnosis Date  . Hypertension     Past Surgical History:  Procedure Laterality Date  . CARPAL TUNNEL RELEASE Bilateral   . CHOLECYSTECTOMY    . GASTROPLASTY DUODENAL SWITCH      There were no vitals filed for this visit.      Subjective Assessment - 07/27/16 1213    Subjective  Pt presents for OT visit 46/72 visits ( visit 11 for 2018) to address sever, stage 4, BLE lipo-lymphedema.. Pt has no new complaints. DME vendor emailed OT trequesting additional measurements at [facility] for garments. OT completed and emailed, along w/ anonymouse phoos of knees.   Patient is accompained by: Family member   Pertinent History SIPPS sx 04/25/15 w/ weight at 306 lbs. Current weight 212.6 lbs. HTN, Asthma, OSA ( uses CPAP) B CTR   Limitations BLE chronic leg pain and swelling, L>R, difficulty walking, decreased standing tolerance, decreased activity tolerance, difficulty w/ functional mobility/ transfers   Patient Stated Goals decrease leg swelling and pain and keep it from getting worse.   Currently in Pain? No/denies   Pain Onset More than a month ago                      OT Treatments/Exercises (OP)  - 07/27/16 0001      ADLs   ADL Education Given Yes     Manual Therapy   Manual Therapy Edema management;Manual Lymphatic Drainage (MLD);Compression Bandaging   Manual therapy comments completed additional anatomical measurements above and below both knees for garment manufacturer to limit tournquet at knees.   Manual Lymphatic Drainage (MLD) MLD to LLE as established.   Compression Bandaging BLE wraped from ankle to groin as established.                 OT Education - 07/27/16 1216    Education provided Yes   Education Details Continued skilled Pt/caregiver education  And LE ADL training throughout visit for lymphedema self care/ home program, including compression wrapping, compression garment and device wear/care, lymphatic pumping ther ex, simple self-MLD, and skin care. Discussed progress towards goals.   Person(s) Educated Patient   Methods Explanation   Comprehension Verbalized understanding             OT Long Term Goals - 06/25/16 0917      OT LONG TERM GOAL #1   Title Lymphedema (LE) management/ self-care: Pt able to apply multi layered, gradient compression wraps with min caregiver assistance using proper techniques within 2 weeks to achieve optimal limb volume reduction.   Baseline dependent for LE self care   Time 2   Status Achieved  OT LONG TERM GOAL #2   Title Lymphedema (LE) management/ self-care:  Pt to achieve at least 10% LLE limb volume reductions bilaterally during Intensive CDT to limit LE progression, decrease infection and falls risk, to reduce pain/, and to improve safe ambulation and functional mobility.   Baseline dependent for LE self care   Time 12   Period Weeks   Status Partially Met     OT LONG TERM GOAL #3   Title Lymphedema (LE) management/ self-care:  Pt >/= 85 % compliant with all daily, LE self-care protocols for home program w/ needed level of caregiver assistance , including simple self-manual lymphatic drainage (MLD), skin  care, lymphatic pumping the ex, skin care, and donning/ doffing compression wraps and garments o limit LE progression and further functional decline.     Baseline dependent for LE self care   Time 12   Period Weeks   Status Achieved     OT LONG TERM GOAL #4   Title Lymphedema (LE) management/ self-care:  Pt to tolerate daily compression wraps, garments and devices in keeping w/ prescribed wear regime within 1 week of issue date to progress and retain clinical and functional gains and to limit LE progression.   Baseline dependent for LE self care   Time 12   Period Weeks   Status Achieved     OT LONG TERM GOAL #5   Title Lymphedema (LE) self-care:  During Management Phase CDT Pt to sustain limb volume reductions achieved during Intensive Phase CDT within 5% utilizing LE self-care protocols, appropriate compression garments/ devices, and needed level of caregiver assistance.   Baseline dependent for LE self care   Time 6   Period Months   Status On-going               Plan - 07/27/16 1217    Clinical Impression Statement Pt admits to decreased compliance w/ compression wrapping lately. Overall limb swelling appears well controlled today. Pt tolerated all treatment protocols today w/out difficulty. Fit compression ASAP. Cont as per POC 1 x weekly until custom garments and devices are fit.   Rehab Potential Good   OT Frequency 1x / week   OT Duration 12 weeks   OT Treatment/Interventions Self-care/ADL training;Therapeutic exercise;Patient/family education;Manual Therapy;Manual lymph drainage;Other (comment);DME and/or AE instruction;Compression bandaging;Therapeutic activities  skin care   OT Home Exercise Plan simple self MLD to BLE 1 x daily-full sequence; BLE lymphatic pumping ther ex 1 x daily- 10 reps each in sequence; full time (23/7)  short stretch compression wraps daily until custom garments delivered and fitted; flxitouch sequential pneumatic compression device on  alternating legs daily and PRN; daily skin care   Consulted and Agree with Plan of Care Patient      Patient will benefit from skilled therapeutic intervention in order to improve the following deficits and impairments:  Decreased skin integrity, Decreased activity tolerance, Decreased knowledge of use of DME, Impaired flexibility, Decreased balance, Decreased mobility, Difficulty walking, Obesity, Increased edema, Pain, Abnormal gait, Decreased knowledge of precautions  Visit Diagnosis: Lymphedema, not elsewhere classified    Problem List There are no active problems to display for this patient.  Andrey Spearman, MS, OTR/L, [facility] [place] 07/27/16 12:20 PM   [place] MAIN [facility] SERVICES [address] [place], [place], [address] Phone: [phone]   Fax:  [phone]  Name: Danielle Lin MRN: [id number] Date of Birth: Nov 25, 1975

## 2016-07-31 ENCOUNTER — Encounter: Payer: Managed Care, Other (non HMO) | Admitting: Occupational Therapy

## 2016-08-01 ENCOUNTER — Ambulatory Visit: Payer: Managed Care, Other (non HMO) | Attending: Family Medicine | Admitting: Occupational Therapy

## 2016-08-01 DIAGNOSIS — I890 Lymphedema, not elsewhere classified: Secondary | ICD-10-CM | POA: Insufficient documentation

## 2016-08-01 NOTE — Therapy (Signed)
[place] MAIN [facility] SERVICES [address] [place], [place], [address] Phone: [phone]   Fax:  [phone]  Occupational Therapy Treatment  Patient Details  Name: Danielle Lin MRN: [id number] Date of Birth: 03-18-1976 No Data Recorded  Encounter Date: 08/01/2016      OT End of Session - 08/01/16 1649    Visit Number 65   Number of Visits 32   Date for OT Re-Evaluation 08/30/16   Authorization Type 39/72CDT to BLE. Visit 10 for 2018   Authorization Time Period additional 12 weeks starting 06/08/16   OT Start Time 0802   OT Stop Time 0908   OT Time Calculation (min) 66 min   Activity Tolerance Patient tolerated treatment well;No increased pain   Behavior During Therapy WFL for tasks assessed/performed      Past Medical History:  Diagnosis Date  . Hypertension     Past Surgical History:  Procedure Laterality Date  . CARPAL TUNNEL RELEASE Bilateral   . CHOLECYSTECTOMY    . GASTROPLASTY DUODENAL SWITCH      There were no vitals filed for this visit.      Subjective Assessment - 08/01/16 0900    Subjective  Pt presents for OT visit 46/72 visits ( visit 11 for 2018) to address sever, stage 4, BLE lipo-lymphedema.. Pt has no new complaints. DME vendor emailed OT trequesting additional measurements at [facility] for garments. OT completed and emailed, along w/ anonymouse phoos of knees.   Patient is accompained by: Family member   Pertinent History SIPPS sx 04/25/15 w/ weight at 306 lbs. Current weight 212.6 lbs. HTN, Asthma, OSA ( uses CPAP) B CTR   Limitations BLE chronic leg pain and swelling, L>R, difficulty walking, decreased standing tolerance, decreased activity tolerance, difficulty w/ functional mobility/ transfers   Patient Stated Goals decrease leg swelling and pain and keep it from getting worse.   Pain Onset More than a month ago                      OT Treatments/Exercises (OP) - 08/01/16 0001      ADLs   ADL Education Given Yes     Manual Therapy   Manual Therapy Edema management;Manual Lymphatic Drainage (MLD);Compression Bandaging   Manual therapy comments completed additional anatomical measurements above and below both knees for garment manufacturer to limit tournquet at knees.   Manual Lymphatic Drainage (MLD) MLD to LLE as established.   Compression Bandaging BLE wraped from ankle to groin as established.                 OT Education - 08/01/16 1650    Education provided Yes   Education Details Provided Pt/caregiver skilled education and ADL training throughout visit for lymphedema etiology, progression, and treatment including Intensive and Management Phase Complete Decongestive Therapy (CDT)  Discussed lymphedema precautions, cellulitis risk, and all CDT and LE self-care components, including compression wrapping/ garments & devices, lymphatic pumping ther ex, simple self-MLD, and skin care. Provided printed Lymphedema Workbook for reference.   Person(s) Educated Patient   Methods Explanation   Comprehension Verbalized understanding             OT Long Term Goals - 06/25/16 0917      OT LONG TERM GOAL #1   Title Lymphedema (LE) management/ self-care: Pt able to apply multi layered, gradient compression wraps with min caregiver assistance using proper techniques within 2 weeks to achieve optimal limb volume reduction.   Baseline  dependent for LE self care   Time 2   Status Achieved     OT LONG TERM GOAL #2   Title Lymphedema (LE) management/ self-care:  Pt to achieve at least 10% LLE limb volume reductions bilaterally during Intensive CDT to limit LE progression, decrease infection and falls risk, to reduce pain/, and to improve safe ambulation and functional mobility.   Baseline dependent for LE self care   Time 12   Period Weeks   Status Partially Met     OT LONG TERM GOAL #3   Title Lymphedema (LE) management/ self-care:  Pt >/= 85 % compliant with all daily,  LE self-care protocols for home program w/ needed level of caregiver assistance , including simple self-manual lymphatic drainage (MLD), skin care, lymphatic pumping the ex, skin care, and donning/ doffing compression wraps and garments o limit LE progression and further functional decline.     Baseline dependent for LE self care   Time 12   Period Weeks   Status Achieved     OT LONG TERM GOAL #4   Title Lymphedema (LE) management/ self-care:  Pt to tolerate daily compression wraps, garments and devices in keeping w/ prescribed wear regime within 1 week of issue date to progress and retain clinical and functional gains and to limit LE progression.   Baseline dependent for LE self care   Time 12   Period Weeks   Status Achieved     OT LONG TERM GOAL #5   Title Lymphedema (LE) self-care:  During Management Phase CDT Pt to sustain limb volume reductions achieved during Intensive Phase CDT within 5% utilizing LE self-care protocols, appropriate compression garments/ devices, and needed level of caregiver assistance.   Baseline dependent for LE self care   Time 6   Period Months   Status On-going               Plan - 08/01/16 1650    Clinical Impression Statement Pt continues to perform all LE self protocols between visits, which have been decreased to 1 x weekly for a couple of weeks now. Swelling appears well controlled and skin condition is good , despite ling term wrapping. Cont as per POC untile custom compression garments are fitted, then decreas e to FU status for Management Phase CDT.   Rehab Potential Good   OT Frequency 1x / week   OT Duration 12 weeks   OT Treatment/Interventions Self-care/ADL training;Therapeutic exercise;Patient/family education;Manual Therapy;Manual lymph drainage;Other (comment);DME and/or AE instruction;Compression bandaging;Therapeutic activities  skin care   OT Home Exercise Plan simple self MLD to BLE 1 x daily-full sequence; BLE lymphatic pumping  ther ex 1 x daily- 10 reps each in sequence; full time (23/7)  short stretch compression wraps daily until custom garments delivered and fitted; flxitouch sequential pneumatic compression device on alternating legs daily and PRN; daily skin care   Consulted and Agree with Plan of Care Patient      Patient will benefit from skilled therapeutic intervention in order to improve the following deficits and impairments:  Decreased skin integrity, Decreased activity tolerance, Decreased knowledge of use of DME, Impaired flexibility, Decreased balance, Decreased mobility, Difficulty walking, Obesity, Increased edema, Pain, Abnormal gait, Decreased knowledge of precautions  Visit Diagnosis: Lymphedema, not elsewhere classified    Problem List There are no active problems to display for this patient.   Andrey Spearman, MS, OTR/L, [facility] 08/01/16 4:53 PM  [place] [facility] [place],  [place], [address] Phone: [phone]   Fax:  [phone]  Name: Danielle Lin MRN: [id number] Date of Birth: February 19, 1976

## 2016-08-03 ENCOUNTER — Encounter: Payer: Managed Care, Other (non HMO) | Admitting: Occupational Therapy

## 2016-08-08 ENCOUNTER — Ambulatory Visit: Payer: Managed Care, Other (non HMO) | Admitting: Occupational Therapy

## 2016-08-08 DIAGNOSIS — I890 Lymphedema, not elsewhere classified: Secondary | ICD-10-CM

## 2016-08-08 NOTE — Patient Instructions (Signed)
Wash and wear panty + capri leggings to fully assess function and fit. Bring concerns on Friday to OT session and we will remeasure PRN

## 2016-08-08 NOTE — Therapy (Signed)
[place] MAIN [facility] SERVICES [address] [place], [place], [address] Phone: [phone]   Fax:  [phone]  Occupational Therapy Treatment  Patient Details  Name: Danielle Lin MRN: [id number] Date of Birth: 09/08/1975 No Data Recorded  Encounter Date: 08/08/2016      OT End of Session - 08/08/16 0948    OT Start Time 0805   OT Stop Time 0915   OT Time Calculation (min) 70 min   Equipment Utilized During Treatment Tyveck slippie sock, Juzo friction matt, Easy slide, rubber palm gloves   Activity Tolerance Patient tolerated treatment well;No increased pain   Behavior During Therapy WFL for tasks assessed/performed      Past Medical History:  Diagnosis Date  . Hypertension     Past Surgical History:  Procedure Laterality Date  . CARPAL TUNNEL RELEASE Bilateral   . CHOLECYSTECTOMY    . GASTROPLASTY DUODENAL SWITCH      There were no vitals filed for this visit.      Subjective Assessment - 08/08/16 0935    Subjective  Pt presents for OT visit 47/72 visits ( visit 12 for 2018) to address sever, stage 4, BLE lipo-lymphedema. Pt brings custom compression garments to clinic this morning for fitting and preliminary assessment.   Patient is accompained by: Family member   Pertinent History SIPPS sx 04/25/15 w/ weight at 306 lbs. Current weight 212.6 lbs. HTN, Asthma, OSA ( uses CPAP) B CTR   Limitations BLE chronic leg pain and swelling, L>R, difficulty walking, decreased standing tolerance, decreased activity tolerance, difficulty w/ functional mobility/ transfers   Patient Stated Goals decrease leg swelling and pain and keep it from getting worse.   Currently in Pain? No/denies   Pain Onset More than a month ago                      OT Treatments/Exercises (OP) - 08/08/16 0001      ADLs   ADL Education Given Yes     Manual Therapy   Manual Therapy Edema management   Manual therapy comments Custom, ccl 1 + 2  Elvarex capri panty is perfect fit upon initial assessment! completed additional anatomical measurements to adjust custom knee high fit at ankle and at top edge. Changes to original A-D ccl 3: LLE  CY increase 33.5 cm (was 32 cm);  CB increase to 32 cm (was 27 cm); CD decrease to 57.5 cm ( was 59.5); Add tricot pad to anterior ankle SEWN ON ALL 4 SIDES.  Changes to RLE: CY increase to 32 cm (was 31.5 cm) CB increase to 31.5 cm (was 27 cm);CD decrease to 57.5 (was 60.3 cm); Add tricot pad to anterior ankle SEWN ON ALL 4 SIDES                 OT Education - 08/08/16 0947    Education provided Yes   Education Details skilled Pt edu for compression garment wear and care regimes, and donning and doffing w/ variety of assistive devices   Person(s) Educated Patient   Methods Explanation;Demonstration;Tactile cues;Verbal cues;Handout   Comprehension Verbalized understanding;Returned demonstration;Verbal cues required;Tactile cues required;Need further instruction             OT Long Term Goals - 06/25/16 0917      OT LONG TERM GOAL #1   Title Lymphedema (LE) management/ self-care: Pt able to apply multi layered, gradient compression wraps with min caregiver assistance using proper techniques within 2 weeks  to achieve optimal limb volume reduction.   Baseline dependent for LE self care   Time 2   Status Achieved     OT LONG TERM GOAL #2   Title Lymphedema (LE) management/ self-care:  Pt to achieve at least 10% LLE limb volume reductions bilaterally during Intensive CDT to limit LE progression, decrease infection and falls risk, to reduce pain/, and to improve safe ambulation and functional mobility.   Baseline dependent for LE self care   Time 12   Period Weeks   Status Partially Met     OT LONG TERM GOAL #3   Title Lymphedema (LE) management/ self-care:  Pt >/= 85 % compliant with all daily, LE self-care protocols for home program w/ needed level of caregiver assistance , including  simple self-manual lymphatic drainage (MLD), skin care, lymphatic pumping the ex, skin care, and donning/ doffing compression wraps and garments o limit LE progression and further functional decline.     Baseline dependent for LE self care   Time 12   Period Weeks   Status Achieved     OT LONG TERM GOAL #4   Title Lymphedema (LE) management/ self-care:  Pt to tolerate daily compression wraps, garments and devices in keeping w/ prescribed wear regime within 1 week of issue date to progress and retain clinical and functional gains and to limit LE progression.   Baseline dependent for LE self care   Time 12   Period Weeks   Status Achieved     OT LONG TERM GOAL #5   Title Lymphedema (LE) self-care:  During Management Phase CDT Pt to sustain limb volume reductions achieved during Intensive Phase CDT within 5% utilizing LE self-care protocols, appropriate compression garments/ devices, and needed level of caregiver assistance.   Baseline dependent for LE self care   Time 6   Period Months   Status On-going               Plan - 08/08/16 2800    Clinical Impression Statement Completed initial fitting and assessment of custom Elvarex daytime compression garments. The ccl 2 capri + ccl 1 panty portion appear initially as a perfect fit. Pt expressed satisfaction with fit and comfort. Pt will wash and wear to fully assess wearability and effectiveness between visit intervals. Pt able to don and doff  garment using easy slide and rubber gloves after skilled edu. Knee length garments ( ccl 3)  need adjustment at ankle measurements and knee circumferences to improve fit. SEE TREATMENT section for details. Pt will utilize capri panty w/ overlapping tubegrip in the meantime PRN. Continue fitting assessment on Friday.   Rehab Potential Good   OT Frequency 1x / week   OT Duration 12 weeks   OT Treatment/Interventions Self-care/ADL training;Therapeutic exercise;Patient/family education;Manual  Therapy;Manual lymph drainage;Other (comment);DME and/or AE instruction;Compression bandaging;Therapeutic activities  skin care   OT Home Exercise Plan simple self MLD to BLE 1 x daily-full sequence; BLE lymphatic pumping ther ex 1 x daily- 10 reps each in sequence; full time (23/7)  short stretch compression wraps daily until custom garments delivered and fitted; flxitouch sequential pneumatic compression device on alternating legs daily and PRN; daily skin care   Consulted and Agree with Plan of Care Patient      Patient will benefit from skilled therapeutic intervention in order to improve the following deficits and impairments:  Decreased skin integrity, Decreased activity tolerance, Decreased knowledge of use of DME, Impaired flexibility, Decreased balance, Decreased mobility, Difficulty walking, Obesity, Increased  edema, Pain, Abnormal gait, Decreased knowledge of precautions  Visit Diagnosis: Lymphedema, not elsewhere classified    Problem List There are no active problems to display for this patient.   Andrey Spearman, MS, OTR/L, [facility] 08/08/16 9:56 AM  [place] MAIN [facility] SERVICES [address] [place], [place], [address] Phone: [phone]   Fax:  [phone]  Name: Juwana Thoreson MRN: [id number] Date of Birth: 08/22/1975

## 2016-08-10 ENCOUNTER — Ambulatory Visit: Payer: Managed Care, Other (non HMO) | Admitting: Occupational Therapy

## 2016-08-10 DIAGNOSIS — I890 Lymphedema, not elsewhere classified: Secondary | ICD-10-CM

## 2016-08-10 NOTE — Therapy (Signed)
[place] MAIN [facility] SERVICES [address] [place], [place], [address] Phone: [phone]   Fax:  [phone]  Occupational Therapy Treatment  Patient Details  Name: Danielle Lin MRN: [id number] Date of Birth: [birth year] No Data Recorded  Encounter Date: 08/10/2016      OT End of Session - 08/10/16 1101    Visit Number 23   Number of Visits 3   Date for OT Re-Evaluation 08/30/16   Authorization Type 39/72CDT to BLE. Visit 10 for 2018   Authorization Time Period additional 12 weeks starting 06/08/16   OT Start Time 0805   OT Stop Time 0905   OT Time Calculation (min) 60 min   Equipment Utilized During Treatment Tyveck slippie sock, Juzo friction matt, Easy slide, rubber palm gloves   Activity Tolerance Patient tolerated treatment well;No increased pain   Behavior During Therapy WFL for tasks assessed/performed      Past Medical History:  Diagnosis Date  . Hypertension     Past Surgical History:  Procedure Laterality Date  . CARPAL TUNNEL RELEASE Bilateral   . CHOLECYSTECTOMY    . GASTROPLASTY DUODENAL SWITCH      There were no vitals filed for this visit.      Subjective Assessment - 08/10/16 0925    Subjective  Pt presents for OT visit 48/72 visits ( visit 13 for 2018) to address sever, stage 4, BLE lipo-lymphedema. Ptreports she had to remove compression capris yesterday due to day long bout with nausea. Pt requests day off from compression today.   Patient is accompained by: Family member   Pertinent History SIPPS sx 04/25/15 w/ weight at 306 lbs. Current weight 212.6 lbs. HTN, Asthma, OSA ( uses CPAP) B CTR   Limitations BLE chronic leg pain and swelling, L>R, difficulty walking, decreased standing tolerance, decreased activity tolerance, difficulty w/ functional mobility/ transfers   Patient Stated Goals decrease leg swelling and pain and keep it from getting worse.   Currently in Pain? No/denies   Pain Onset More than  a month ago                      OT Treatments/Exercises (OP) - 08/10/16 0001      ADLs   ADL Education Given Yes     Manual Therapy   Manual Therapy Edema management   Manual therapy comments Completed new F and G anatomical measurements bilaterally for Elvarex Relax HOS devices. See PLAN narrative for details   Edema Management skin care as established bilaterally   Manual Lymphatic Drainage (MLD) MLD to BLE as established.   Compression Bandaging no compression after MLD                     OT Long Term Goals - 06/25/16 0917      OT LONG TERM GOAL #1   Title Lymphedema (LE) management/ self-care: Pt able to apply multi layered, gradient compression wraps with min caregiver assistance using proper techniques within 2 weeks to achieve optimal limb volume reduction.   Baseline dependent for LE self care   Time 2   Status Achieved     OT LONG TERM GOAL #2   Title Lymphedema (LE) management/ self-care:  Pt to achieve at least 10% LLE limb volume reductions bilaterally during Intensive CDT to limit LE progression, decrease infection and falls risk, to reduce pain/, and to improve safe ambulation and functional mobility.   Baseline dependent for LE self care  Time 12   Period Weeks   Status Partially Met     OT LONG TERM GOAL #3   Title Lymphedema (LE) management/ self-care:  Pt >/= 85 % compliant with all daily, LE self-care protocols for home program w/ needed level of caregiver assistance , including simple self-manual lymphatic drainage (MLD), skin care, lymphatic pumping the ex, skin care, and donning/ doffing compression wraps and garments o limit LE progression and further functional decline.     Baseline dependent for LE self care   Time 12   Period Weeks   Status Achieved     OT LONG TERM GOAL #4   Title Lymphedema (LE) management/ self-care:  Pt to tolerate daily compression wraps, garments and devices in keeping w/ prescribed wear regime  within 1 week of issue date to progress and retain clinical and functional gains and to limit LE progression.   Baseline dependent for LE self care   Time 12   Period Weeks   Status Achieved     OT LONG TERM GOAL #5   Title Lymphedema (LE) self-care:  During Management Phase CDT Pt to sustain limb volume reductions achieved during Intensive Phase CDT within 5% utilizing LE self-care protocols, appropriate compression garments/ devices, and needed level of caregiver assistance.   Baseline dependent for LE self care   Time 6   Period Months   Status On-going               Plan - 08/10/16 1102    Clinical Impression Statement Pt having mild difficulty tolerating new compression panty capris since last visit due to bout of nausea and menstrual discomfort. Pt agrees to recommence with compression on Monday. Completed additional measurements for HOS devices by manufacturer's request. Pt tolerated MLD without difficulty today. Cont as per POC. Fit remade knee highs ASAP. Complete assessment for capris next week. Consider adding vertical or 3/4 SB to top edges to improve fit. Pt agrees to wash and wear over the weekend.   Rehab Potential Good   OT Frequency 1x / week   OT Duration 12 weeks   OT Treatment/Interventions Self-care/ADL training;Therapeutic exercise;Patient/family education;Manual Therapy;Manual lymph drainage;Other (comment);DME and/or AE instruction;Compression bandaging;Therapeutic activities  skin care   OT Home Exercise Plan simple self MLD to BLE 1 x daily-full sequence; BLE lymphatic pumping ther ex 1 x daily- 10 reps each in sequence; full time (23/7)  short stretch compression wraps daily until custom garments delivered and fitted; flxitouch sequential pneumatic compression device on alternating legs daily and PRN; daily skin care   Consulted and Agree with Plan of Care Patient      Patient will benefit from skilled therapeutic intervention in order to improve the  following deficits and impairments:  Decreased skin integrity, Decreased activity tolerance, Decreased knowledge of use of DME, Impaired flexibility, Decreased balance, Decreased mobility, Difficulty walking, Obesity, Increased edema, Pain, Abnormal gait, Decreased knowledge of precautions  Visit Diagnosis: Lymphedema, not elsewhere classified    Problem List There are no active problems to display for this patient.   Andrey Spearman, MS, OTR/L, [facility] 08/10/16 11:07 AM  [place] MAIN [facility] SERVICES [address] [place], [place], [address] Phone: [phone]   Fax:  [phone]  Name: Danielle Lin MRN: [id number] Date of Birth: [birth year]

## 2016-08-15 ENCOUNTER — Ambulatory Visit: Payer: Managed Care, Other (non HMO) | Admitting: Occupational Therapy

## 2016-08-15 DIAGNOSIS — I890 Lymphedema, not elsewhere classified: Secondary | ICD-10-CM | POA: Diagnosis not present

## 2016-08-15 NOTE — Therapy (Signed)
[place] MAIN [facility] SERVICES [address] [place], [place], [address] Phone: [phone]   Fax:  [phone]  Occupational Therapy Treatment Note & Progress Report Patient Details  Name: Danielle Lin MRN: [id number] Date of Birth: 1975/12/16 No Data Recorded  Encounter Date: 08/15/2016      OT End of Session - 08/15/16 1201    Visit Number 46   Number of Visits 72   Date for OT Re-Evaluation 08/30/16   Authorization Type visit 20 for 2018   Authorization Time Period additional 12 weeks starting 06/08/16   OT Start Time 0809   OT Stop Time 0900   OT Time Calculation (min) 51 min   Equipment Utilized During Treatment Tyveck slippie sock, Juzo friction matt, Easy slide, rubber palm gloves   Activity Tolerance Patient tolerated treatment well;No increased pain   Behavior During Therapy WFL for tasks assessed/performed      Past Medical History:  Diagnosis Date  . Hypertension     Past Surgical History:  Procedure Laterality Date  . CARPAL TUNNEL RELEASE Bilateral   . CHOLECYSTECTOMY    . GASTROPLASTY DUODENAL SWITCH      There were no vitals filed for this visit.      Subjective Assessment - 08/15/16 1149    Subjective  Pt presents for OT visit 49/72 visits ( visit 20 for 2018) to address sever, stage 4, BLE lipo-lymphedema. Pt reports panty capri compression garment is more comfortable now than it was iniITIALLY, BUT IT STILL TENDS TO SLIDE DOWN AT CROTCH AREA.   Patient is accompained by: Family member   Pertinent History SIPPS sx 04/25/15 w/ weight at 306 lbs. Current weight 212.6 lbs. HTN, Asthma, OSA ( uses CPAP) B CTR   Limitations BLE chronic leg pain and swelling, L>R, difficulty walking, decreased standing tolerance, decreased activity tolerance, difficulty w/ functional mobility/ transfers   Patient Stated Goals decrease leg swelling and pain and keep it from getting worse.   Pain Onset More than a month ago                       OT Treatments/Exercises (OP) - 08/15/16 0001      ADLs   ADL Education Given Yes     Manual Therapy   Manual Therapy Edema management;Manual Lymphatic Drainage (MLD)   Manual therapy comments Completed new F, G , and HT anatomical measurements on the L in an effort to improve garment fit at groin and crotch area. Will also request 3/4 sb at G, and possible vertical SB anteriorly after speaking w/ tech support at [facility] facility.   Edema Management skin care as established bilaterally   Manual Lymphatic Drainage (MLD) MLD to BLE as established.   Compression Bandaging Pt donned compression capri panty after MLD independently w/out assistive device                OT Education - 08/15/16 1150    Education provided Yes   Education Details Continued skilled Pt/caregiver education  And LE ADL training throughout visit for lymphedema self care/ home program, including compression wrapping, compression garment and device wear/care, lymphatic pumping ther ex, simple self-MLD, and skin care. Discussed progress towards goals.   Person(s) Educated Patient   Methods Explanation   Comprehension Verbalized understanding;Need further instruction             OT Long Term Goals - 08/15/16 1158      OT LONG TERM GOAL #1  Title Lymphedema (LE) management/ self-care: Pt able to apply multi layered, gradient compression wraps with min caregiver assistance using proper techniques within 2 weeks to achieve optimal limb volume reduction.   Baseline dependent for LE self care   Time 2   Period Weeks   Status Achieved     OT LONG TERM GOAL #2   Title Lymphedema (LE) management/ self-care:  Pt to achieve at least 10% LLE limb volume reductions bilaterally during Intensive CDT to limit LE progression, decrease infection and falls risk, to reduce pain/, and to improve safe ambulation and functional mobility.   Baseline dependent for LE self care   Time  12   Period Weeks   Status Partially Met     OT LONG TERM GOAL #3   Title Lymphedema (LE) management/ self-care:  Pt >/= 85 % compliant with all daily, LE self-care protocols for home program w/ needed level of caregiver assistance , including simple self-manual lymphatic drainage (MLD), skin care, lymphatic pumping the ex, skin care, and donning/ doffing compression wraps and garments o limit LE progression and further functional decline.     Baseline dependent for LE self care   Time 12   Period Weeks   Status Achieved     OT LONG TERM GOAL #4   Title Lymphedema (LE) management/ self-care:  Pt to tolerate daily compression wraps, garments and devices in keeping w/ prescribed wear regime within 1 week of issue date to progress and retain clinical and functional gains and to limit LE progression.   Baseline dependent for LE self care   Time 12   Period Weeks   Status Partially Met     OT LONG TERM GOAL #5   Title Lymphedema (LE) self-care:  During Management Phase CDT Pt to sustain limb volume reductions achieved during Intensive Phase CDT within 5% utilizing LE self-care protocols, appropriate compression garments/ devices, and needed level of caregiver assistance.   Baseline dependent for LE self care   Time 6   Period Months   Status On-going               Plan - 08/15/16 1155    Clinical Impression Statement Pt continues to make steady progress towards all OT goals for LE self-management and limb volume reduction. Compression capri panty garment continues to drift downward at groin and crotch area. OT completed additional F, G, and K2-T measurements in effort to tighten these areas up. Will also request manufacturer add 3/4 sb at G and anterior thugh vertical bands. Pt tolerated treatment without difficulty today. Cont as per POC.   Rehab Potential Good   OT Frequency 1x / week   OT Duration 12 weeks   OT Treatment/Interventions Self-care/ADL training;Therapeutic  exercise;Patient/family education;Manual Therapy;Manual lymph drainage;Other (comment);DME and/or AE instruction;Compression bandaging;Therapeutic activities  skin care   OT Home Exercise Plan simple self MLD to BLE 1 x daily-full sequence; BLE lymphatic pumping ther ex 1 x daily- 10 reps each in sequence; full time (23/7)  short stretch compression wraps daily until custom garments delivered and fitted; flxitouch sequential pneumatic compression device on alternating legs daily and PRN; daily skin care   Consulted and Agree with Plan of Care Patient      Patient will benefit from skilled therapeutic intervention in order to improve the following deficits and impairments:  Decreased skin integrity, Decreased activity tolerance, Decreased knowledge of use of DME, Impaired flexibility, Decreased mobility, Difficulty walking, Obesity, Increased edema, Pain, Abnormal gait, Decreased knowledge of precautions,  Decreased balance  Visit Diagnosis: Lymphedema, not elsewhere classified    Problem List There are no active problems to display for this patient.   Andrey Spearman, MS, OTR/L, [facility] 08/15/16 12:07 PM  [place] MAIN [facility] SERVICES [address] [place], [place], [address] Phone: [phone]   Fax:  [phone]  Name: Danielle Lin MRN: [id number] Date of Birth: 25-Aug-1975

## 2016-08-17 ENCOUNTER — Ambulatory Visit: Payer: Managed Care, Other (non HMO) | Admitting: Occupational Therapy

## 2016-08-17 DIAGNOSIS — I890 Lymphedema, not elsewhere classified: Secondary | ICD-10-CM

## 2016-08-17 NOTE — Therapy (Signed)
[place] MAIN [facility] SERVICES [address] [place], [place], [address] Phone: [phone]   Fax:  [phone]  Occupational Therapy Treatment  Patient Details  Name: Danielle Lin MRN: [id number] Date of Birth: 1976/03/15 No Data Recorded  Encounter Date: 08/17/2016      OT End of Session - 08/17/16 1307    Visit Number 50   Number of Visits 58   Date for OT Re-Evaluation 08/30/16   Authorization Type visit 21 for 2018   Authorization Time Period additional 12 weeks starting 06/08/16   OT Start Time 0806   OT Stop Time 0906   OT Time Calculation (min) 60 min   Equipment Utilized During Treatment Tyveck slippie sock, Juzo friction matt, Easy slide, rubber palm gloves   Activity Tolerance Patient tolerated treatment well;No increased pain   Behavior During Therapy WFL for tasks assessed/performed      Past Medical History:  Diagnosis Date  . Hypertension     Past Surgical History:  Procedure Laterality Date  . CARPAL TUNNEL RELEASE Bilateral   . CHOLECYSTECTOMY    . GASTROPLASTY DUODENAL SWITCH      There were no vitals filed for this visit.      Subjective Assessment - 08/17/16 1304    Subjective  Pt presents for OT visit 26 /72 visits ( visit 21 for 2018) to address sever, stage 4, BLE lipo-lymphedema. Pt brings remade LLE knee high garment to clinic for fitting. "The RIGHT one isn't here yet. Maybe it'll come this weekend?"   Patient is accompained by: Family member   Pertinent History SIPPS sx 04/25/15 w/ weight at 306 lbs. Current weight 212.6 lbs. HTN, Asthma, OSA ( uses CPAP) B CTR   Limitations BLE chronic leg pain and swelling, L>R, difficulty walking, decreased standing tolerance, decreased activity tolerance, difficulty w/ functional mobility/ transfers   Patient Stated Goals decrease leg swelling and pain and keep it from getting worse.   Currently in Pain? No/denies   Pain Onset More than a month ago                       OT Treatments/Exercises (OP) - 08/17/16 0001      ADLs   ADL Education Given Yes     Manual Therapy   Manual Therapy Edema management;Manual Lymphatic Drainage (MLD)   Edema Management skin care as established bilaterally   Manual Lymphatic Drainage (MLD) MLD to BLE as established.   Compression Bandaging fit remake of custom LLE Elvarex ccl 3 knee high                OT Education - 08/17/16 1306    Education provided Yes   Education Details Cont Pt edu for donning custom compression garments using assistive devicees   Person(s) Educated Patient   Methods Explanation;Demonstration;Handout;Verbal cues   Comprehension Verbalized understanding;Returned demonstration;Verbal cues required             OT Long Term Goals - 08/15/16 1158      OT LONG TERM GOAL #1   Title Lymphedema (LE) management/ self-care: Pt able to apply multi layered, gradient compression wraps with min caregiver assistance using proper techniques within 2 weeks to achieve optimal limb volume reduction.   Baseline dependent for LE self care   Time 2   Period Weeks   Status Achieved     OT LONG TERM GOAL #2   Title Lymphedema (LE) management/ self-care:  Pt to achieve at  least 10% LLE limb volume reductions bilaterally during Intensive CDT to limit LE progression, decrease infection and falls risk, to reduce pain/, and to improve safe ambulation and functional mobility.   Baseline dependent for LE self care   Time 12   Period Weeks   Status Partially Met     OT LONG TERM GOAL #3   Title Lymphedema (LE) management/ self-care:  Pt >/= 85 % compliant with all daily, LE self-care protocols for home program w/ needed level of caregiver assistance , including simple self-manual lymphatic drainage (MLD), skin care, lymphatic pumping the ex, skin care, and donning/ doffing compression wraps and garments o limit LE progression and further functional decline.     Baseline  dependent for LE self care   Time 12   Period Weeks   Status Achieved     OT LONG TERM GOAL #4   Title Lymphedema (LE) management/ self-care:  Pt to tolerate daily compression wraps, garments and devices in keeping w/ prescribed wear regime within 1 week of issue date to progress and retain clinical and functional gains and to limit LE progression.   Baseline dependent for LE self care   Time 12   Period Weeks   Status Partially Met     OT LONG TERM GOAL #5   Title Lymphedema (LE) self-care:  During Management Phase CDT Pt to sustain limb volume reductions achieved during Intensive Phase CDT within 5% utilizing LE self-care protocols, appropriate compression garments/ devices, and needed level of caregiver assistance.   Baseline dependent for LE self care   Time 6   Period Months   Status On-going               Plan - 08/17/16 1308    Clinical Impression Statement Pt tolerated all aspects of clinical CDT today. Custom, LLE, Elvarex, ccl knee length garment  appears to fit and function well by initial assessment. Pt states that ankle portion is much less tight and constrictive than initial garment was. Consequently it is more comfortable. Pt agrees to wear garment and wash it over the weekend to fully assess function. Pt in agreement w/ plan to decrease OT frequency from 2 to 1 x weekly while awaiting additional garment and device fittings.   Rehab Potential Good   OT Frequency 1x / week   OT Duration 12 weeks   OT Treatment/Interventions Self-care/ADL training;Therapeutic exercise;Patient/family education;Manual Therapy;Manual lymph drainage;Other (comment);DME and/or AE instruction;Compression bandaging;Therapeutic activities  skin care   OT Home Exercise Plan simple self MLD to BLE 1 x daily-full sequence; BLE lymphatic pumping ther ex 1 x daily- 10 reps each in sequence; full time (23/7)  short stretch compression wraps daily until custom garments delivered and fitted; flxitouch  sequential pneumatic compression device on alternating legs daily and PRN; daily skin care   Consulted and Agree with Plan of Care Patient      Patient will benefit from skilled therapeutic intervention in order to improve the following deficits and impairments:  Decreased skin integrity, Decreased activity tolerance, Decreased knowledge of use of DME, Impaired flexibility, Decreased mobility, Difficulty walking, Obesity, Increased edema, Pain, Abnormal gait, Decreased knowledge of precautions, Decreased balance  Visit Diagnosis: Lymphedema, not elsewhere classified    Problem List There are no active problems to display for this patient.   Andrey Spearman, MS, OTR/L, [facility] 08/17/16 1:12 PM   [place] MAIN [facility] SERVICES [address] [place], [place], [address] Phone: [phone]   Fax:  [phone]  Name: Danielle Lin MRN: [id number] Date of Birth: 05/03/1975

## 2016-08-21 ENCOUNTER — Ambulatory Visit: Payer: Managed Care, Other (non HMO) | Admitting: Occupational Therapy

## 2016-08-21 DIAGNOSIS — I890 Lymphedema, not elsewhere classified: Secondary | ICD-10-CM

## 2016-08-21 NOTE — Therapy (Signed)
[place] MAIN [facility] SERVICES [address] [place], [place], [address] Phone: [phone]   Fax:  [phone]  Occupational Therapy Treatment  Patient Details  Name: Danielle Lin MRN: [id number] Date of Birth: [birth year] No Data Recorded  Encounter Date: 08/21/2016      OT End of Session - 08/21/16 1137    Visit Number 51   Number of Visits 65   Date for OT Re-Evaluation 08/30/16   Authorization Type visit 21 for 2018   Authorization Time Period additional 12 weeks starting 06/08/16   OT Start Time 0824   OT Stop Time 0924   OT Time Calculation (min) 60 min   Equipment Utilized During Treatment Tyveck slippie sock, Juzo friction matt, Easy slide, rubber palm gloves   Activity Tolerance Patient tolerated treatment well;No increased pain   Behavior During Therapy WFL for tasks assessed/performed      Past Medical History:  Diagnosis Date  . Hypertension     Past Surgical History:  Procedure Laterality Date  . CARPAL TUNNEL RELEASE Bilateral   . CHOLECYSTECTOMY    . GASTROPLASTY DUODENAL SWITCH      There were no vitals filed for this visit.      Subjective Assessment - 08/21/16 1131    Subjective  Pt presents for OT visit 47 /72 visits ( visit 22 for 2018) to address sever, stage 4, BLE lipo-lymphedema. Pt reports she still has not received the remade LLE knee length custom Elvarex.   Patient is accompained by: Family member   Pertinent History SIPPS sx 04/25/15 w/ weight at 306 lbs. Current weight 212.6 lbs. HTN, Asthma, OSA ( uses CPAP) B CTR   Limitations BLE chronic leg pain and swelling, L>R, difficulty walking, decreased standing tolerance, decreased activity tolerance, difficulty w/ functional mobility/ transfers   Patient Stated Goals decrease leg swelling and pain and keep it from getting worse.   Currently in Pain? No/denies   Pain Onset More than a month ago                              OT  Education - 08/21/16 1137    Education provided Yes   Education Details Continued skilled Pt/caregiver education  And LE ADL training throughout visit for lymphedema self care/ home program, including compression wrapping, compression garment and device wear/care, lymphatic pumping ther ex, simple self-MLD, and skin care. Discussed progress towards goals.   Person(s) Educated Patient   Methods Explanation   Comprehension Verbalized understanding             OT Long Term Goals - 08/15/16 1158      OT LONG TERM GOAL #1   Title Lymphedema (LE) management/ self-care: Pt able to apply multi layered, gradient compression wraps with min caregiver assistance using proper techniques within 2 weeks to achieve optimal limb volume reduction.   Baseline dependent for LE self care   Time 2   Period Weeks   Status Achieved     OT LONG TERM GOAL #2   Title Lymphedema (LE) management/ self-care:  Pt to achieve at least 10% LLE limb volume reductions bilaterally during Intensive CDT to limit LE progression, decrease infection and falls risk, to reduce pain/, and to improve safe ambulation and functional mobility.   Baseline dependent for LE self care   Time 12   Period Weeks   Status Partially Met     OT LONG TERM GOAL #  3   Title Lymphedema (LE) management/ self-care:  Pt >/= 85 % compliant with all daily, LE self-care protocols for home program w/ needed level of caregiver assistance , including simple self-manual lymphatic drainage (MLD), skin care, lymphatic pumping the ex, skin care, and donning/ doffing compression wraps and garments o limit LE progression and further functional decline.     Baseline dependent for LE self care   Time 12   Period Weeks   Status Achieved     OT LONG TERM GOAL #4   Title Lymphedema (LE) management/ self-care:  Pt to tolerate daily compression wraps, garments and devices in keeping w/ prescribed wear regime within 1 week of issue date to progress and retain  clinical and functional gains and to limit LE progression.   Baseline dependent for LE self care   Time 12   Period Weeks   Status Partially Met     OT LONG TERM GOAL #5   Title Lymphedema (LE) self-care:  During Management Phase CDT Pt to sustain limb volume reductions achieved during Intensive Phase CDT within 5% utilizing LE self-care protocols, appropriate compression garments/ devices, and needed level of caregiver assistance.   Baseline dependent for LE self care   Time 6   Period Months   Status On-going               Plan - 08/21/16 1143    Clinical Impression Statement Pt tearful this morning stating that she feels hormone therapy perscribed by fertility clinic has her moods a little raw. Pt toleratated all aspects of clinical CDT today. Pt able to don LLE knee high after session without assistive devices with cues to improve fit over calf. Compression wraps applied below knee on the L. Emailed garment vendor on Pt's behalf to request ETA for treatment planning purposes., Cont  OT for CDT 1 x weekly until garment fitting is complete.   Rehab Potential Good   OT Frequency 1x / week   OT Duration 12 weeks   OT Treatment/Interventions Self-care/ADL training;Therapeutic exercise;Patient/family education;Manual Therapy;Manual lymph drainage;Other (comment);DME and/or AE instruction;Compression bandaging;Therapeutic activities  skin care   OT Home Exercise Plan simple self MLD to BLE 1 x daily-full sequence; BLE lymphatic pumping ther ex 1 x daily- 10 reps each in sequence; full time (23/7)  short stretch compression wraps daily until custom garments delivered and fitted; flxitouch sequential pneumatic compression device on alternating legs daily and PRN; daily skin care   Consulted and Agree with Plan of Care Patient      Patient will benefit from skilled therapeutic intervention in order to improve the following deficits and impairments:  Decreased skin integrity, Decreased  activity tolerance, Decreased knowledge of use of DME, Impaired flexibility, Decreased mobility, Difficulty walking, Obesity, Increased edema, Pain, Abnormal gait, Decreased knowledge of precautions, Decreased balance  Visit Diagnosis: Lymphedema, not elsewhere classified    Problem List There are no active problems to display for this patient.   Andrey Spearman, MS, OTR/L, [facility] 08/21/16 11:47 AM  [place] MAIN [facility] SERVICES [address] [place], [place], [address] Phone: [phone]   Fax:  [phone]  Name: Majesty Stehlin MRN: [id number] Date of Birth: 01-18-1976

## 2016-08-22 ENCOUNTER — Encounter: Payer: Managed Care, Other (non HMO) | Admitting: Occupational Therapy

## 2016-08-24 ENCOUNTER — Encounter: Payer: Managed Care, Other (non HMO) | Admitting: Occupational Therapy

## 2016-08-27 ENCOUNTER — Ambulatory Visit: Payer: Managed Care, Other (non HMO) | Admitting: Occupational Therapy

## 2016-08-27 DIAGNOSIS — I890 Lymphedema, not elsewhere classified: Secondary | ICD-10-CM | POA: Diagnosis not present

## 2016-08-27 NOTE — Therapy (Signed)
[place] MAIN [facility] SERVICES [address] [place], [place], [address] Phone: [phone]   Fax:  [phone]  Occupational Therapy Treatment  Patient Details  Name: Danielle Lin MRN: [id number] Date of Birth: 1975/10/10 No Data Recorded  Encounter Date: 08/27/2016      OT End of Session - 08/27/16 1747    Visit Number 36   Number of Visits 58   Date for OT Re-Evaluation 08/30/16   Authorization Type visit 21 for 2018   Authorization Time Period additional 12 weeks starting 06/08/16   OT Start Time 0405   OT Stop Time 0505   OT Time Calculation (min) 60 min   Equipment Utilized During Treatment Tyveck slippie sock, Juzo friction matt, Easy slide, rubber palm gloves   Activity Tolerance Patient tolerated treatment well;No increased pain   Behavior During Therapy WFL for tasks assessed/performed      Past Medical History:  Diagnosis Date  . Hypertension     Past Surgical History:  Procedure Laterality Date  . CARPAL TUNNEL RELEASE Bilateral   . CHOLECYSTECTOMY    . GASTROPLASTY DUODENAL SWITCH      There were no vitals filed for this visit.      Subjective Assessment - 08/27/16 1748    Subjective  Pt presents for OT visit 13 /72 visits ( visit 23 for 2018) to address sever, stage 4, BLE lipo-lymphedema. Pt reports she still has not received the remade LLE knee length custom Elvarex.   Patient is accompained by: Family member   Pertinent History SIPPS sx 04/25/15 w/ weight at 306 lbs. Current weight 212.6 lbs. HTN, Asthma, OSA ( uses CPAP) B CTR   Limitations BLE chronic leg pain and swelling, L>R, difficulty walking, decreased standing tolerance, decreased activity tolerance, difficulty w/ functional mobility/ transfers   Patient Stated Goals decrease leg swelling and pain and keep it from getting worse.   Currently in Pain? No/denies   Pain Onset More than a month ago                      OT  Treatments/Exercises (OP) - 08/27/16 0001      ADLs   ADL Education Given Yes     Manual Therapy   Manual Therapy Edema management;Manual Lymphatic Drainage (MLD)   Edema Management skin care as established bilaterally   Manual Lymphatic Drainage (MLD) MLD to BLE as established.   Compression Bandaging pt to don compression garments after shower this eve                OT Education - 08/27/16 1746    Education provided Yes   Education Details Continued skilled Pt/caregiver education  And LE ADL training throughout visit for lymphedema self care/ home program, including compression wrapping, compression garment and device wear/care, lymphatic pumping ther ex, simple self-MLD, and skin care. Discussed progress towards goals.   Person(s) Educated Patient   Methods Explanation;Demonstration   Comprehension Verbalized understanding             OT Long Term Goals - 08/15/16 1158      OT LONG TERM GOAL #1   Title Lymphedema (LE) management/ self-care: Pt able to apply multi layered, gradient compression wraps with min caregiver assistance using proper techniques within 2 weeks to achieve optimal limb volume reduction.   Baseline dependent for LE self care   Time 2   Period Weeks   Status Achieved     OT LONG TERM  GOAL #2   Title Lymphedema (LE) management/ self-care:  Pt to achieve at least 10% LLE limb volume reductions bilaterally during Intensive CDT to limit LE progression, decrease infection and falls risk, to reduce pain/, and to improve safe ambulation and functional mobility.   Baseline dependent for LE self care   Time 12   Period Weeks   Status Partially Met     OT LONG TERM GOAL #3   Title Lymphedema (LE) management/ self-care:  Pt >/= 85 % compliant with all daily, LE self-care protocols for home program w/ needed level of caregiver assistance , including simple self-manual lymphatic drainage (MLD), skin care, lymphatic pumping the ex, skin care, and donning/  doffing compression wraps and garments o limit LE progression and further functional decline.     Baseline dependent for LE self care   Time 12   Period Weeks   Status Achieved     OT LONG TERM GOAL #4   Title Lymphedema (LE) management/ self-care:  Pt to tolerate daily compression wraps, garments and devices in keeping w/ prescribed wear regime within 1 week of issue date to progress and retain clinical and functional gains and to limit LE progression.   Baseline dependent for LE self care   Time 12   Period Weeks   Status Partially Met     OT LONG TERM GOAL #5   Title Lymphedema (LE) self-care:  During Management Phase CDT Pt to sustain limb volume reductions achieved during Intensive Phase CDT within 5% utilizing LE self-care protocols, appropriate compression garments/ devices, and needed level of caregiver assistance.   Baseline dependent for LE self care   Time 6   Period Months   Status On-going               Plan - 08/27/16 1749    Clinical Impression Statement Pt tolerated all aspects of manual therapy and Pt edu for LE self care today without difficulty, including MLD and skin care. DME provider has notified us that compression garments and devices have been shipped from manufacturer. Pt will come in early next week late in the afternoon for a garment check. Cont as per POC.   Rehab Potential Good   OT Frequency 1x / week   OT Duration 12 weeks   OT Treatment/Interventions Self-care/ADL training;Therapeutic exercise;Patient/family education;Manual Therapy;Manual lymph drainage;Other (comment);DME and/or AE instruction;Compression bandaging;Therapeutic activities  skin care   OT Home Exercise Plan simple self MLD to BLE 1 x daily-full sequence; BLE lymphatic pumping ther ex 1 x daily- 10 reps each in sequence; full time (23/7)  short stretch compression wraps daily until custom garments delivered and fitted; flxitouch sequential pneumatic compression device on alternating  legs daily and PRN; daily skin care   Consulted and Agree with Plan of Care Patient      Patient will benefit from skilled therapeutic intervention in order to improve the following deficits and impairments:  Decreased skin integrity, Decreased activity tolerance, Decreased knowledge of use of DME, Impaired flexibility, Decreased mobility, Difficulty walking, Obesity, Increased edema, Pain, Abnormal gait, Decreased knowledge of precautions, Decreased balance  Visit Diagnosis: Lymphedema, not elsewhere classified    Problem List There are no active problems to display for this patient.   Andrey Spearman, MS, OTR/L, [facility] 08/27/16 5:51 PM  [place] MAIN [facility] SERVICES [address] [place], [place], [address] Phone: [phone]   Fax:  [phone]  Name: Danielle Lin MRN: [id number] Date of Birth: 1975/10/19

## 2016-09-03 ENCOUNTER — Ambulatory Visit: Payer: Managed Care, Other (non HMO) | Attending: Family Medicine | Admitting: Occupational Therapy

## 2016-09-03 DIAGNOSIS — I890 Lymphedema, not elsewhere classified: Secondary | ICD-10-CM | POA: Diagnosis not present

## 2016-09-03 NOTE — Therapy (Signed)
[place] MAIN [facility] SERVICES [address] [place], [place], [address] Phone: [phone]   Fax:  [phone]  Occupational Therapy Treatment  Patient Details  Name: Danielle Lin MRN: [id number] Date of Birth: 1975-05-30 No Data Recorded  Encounter Date: 09/03/2016      OT End of Session - 09/03/16 1707    Visit Number 46   Number of Visits 31   Date for OT Re-Evaluation 08/30/16   Authorization Type visit 21 for 2018   Authorization Time Period additional 12 weeks starting 06/08/16   OT Start Time 0413   OT Stop Time 0445   OT Time Calculation (min) 32 min   Equipment Utilized During Treatment Tyveck slippie sock, Juzo friction matt, Easy slide, rubber palm gloves   Activity Tolerance Patient tolerated treatment well;No increased pain   Behavior During Therapy WFL for tasks assessed/performed      Past Medical History:  Diagnosis Date  . Hypertension     Past Surgical History:  Procedure Laterality Date  . CARPAL TUNNEL RELEASE Bilateral   . CHOLECYSTECTOMY    . GASTROPLASTY DUODENAL SWITCH      There were no vitals filed for this visit.      Subjective Assessment - 09/03/16 1704    Subjective  Pt presents for OT visit 71 /72 visits ( visit 23 for 2018) to address sever, stage 4, BLE lipo-lymphedema. Pt brings remade LLE knee length compression garment and BLE Jobst Relax thigh length HOS devices to clinic for fitting and iinitial assessment today.   Patient is accompained by: Family member   Pertinent History SIPPS sx 04/25/15 w/ weight at 306 lbs. Current weight 212.6 lbs. HTN, Asthma, OSA ( uses CPAP) B CTR   Limitations BLE chronic leg pain and swelling, L>R, difficulty walking, decreased standing tolerance, decreased activity tolerance, difficulty w/ functional mobility/ transfers   Patient Stated Goals decrease leg swelling and pain and keep it from getting worse.   Currently in Pain? No/denies   Pain Onset More than a  month ago                      OT Treatments/Exercises (OP) - 09/03/16 0001      ADLs   ADL Education Given Yes     Manual Therapy   Manual Therapy Edema management   Edema Management LLE compression garment and BLE HOS device fitting and assessment                OT Education - 09/03/16 1706    Education provided Yes   Education Details Pt LE self care training for compression garment and device donning and doffing, wear and care regimes   Person(s) Educated Patient   Methods Explanation;Tactile cues;Demonstration;Verbal cues;Handout   Comprehension Verbalized understanding;Returned demonstration;Verbal cues required;Tactile cues required             OT Long Term Goals - 08/15/16 1158      OT LONG TERM GOAL #1   Title Lymphedema (LE) management/ self-care: Pt able to apply multi layered, gradient compression wraps with min caregiver assistance using proper techniques within 2 weeks to achieve optimal limb volume reduction.   Baseline dependent for LE self care   Time 2   Period Weeks   Status Achieved     OT LONG TERM GOAL #2   Title Lymphedema (LE) management/ self-care:  Pt to achieve at least 10% LLE limb volume reductions bilaterally during Intensive CDT to limit  LE progression, decrease infection and falls risk, to reduce pain/, and to improve safe ambulation and functional mobility.   Baseline dependent for LE self care   Time 12   Period Weeks   Status Partially Met     OT LONG TERM GOAL #3   Title Lymphedema (LE) management/ self-care:  Pt >/= 85 % compliant with all daily, LE self-care protocols for home program w/ needed level of caregiver assistance , including simple self-manual lymphatic drainage (MLD), skin care, lymphatic pumping the ex, skin care, and donning/ doffing compression wraps and garments o limit LE progression and further functional decline.     Baseline dependent for LE self care   Time 12   Period Weeks   Status  Achieved     OT LONG TERM GOAL #4   Title Lymphedema (LE) management/ self-care:  Pt to tolerate daily compression wraps, garments and devices in keeping w/ prescribed wear regime within 1 week of issue date to progress and retain clinical and functional gains and to limit LE progression.   Baseline dependent for LE self care   Time 12   Period Weeks   Status Partially Met     OT LONG TERM GOAL #5   Title Lymphedema (LE) self-care:  During Management Phase CDT Pt to sustain limb volume reductions achieved during Intensive Phase CDT within 5% utilizing LE self-care protocols, appropriate compression garments/ devices, and needed level of caregiver assistance.   Baseline dependent for LE self care   Time 6   Period Months   Status On-going               Plan - 09/03/16 1708    Clinical Impression Statement Remade jobst, Elvarex, ccl 3  knee length compression garment appears to fit and function well upon initial assessment. Pt states she's worn it all day and tolerated without difficulty, despite sone tightness at anklle. BLE thigh length Jobst RELAX HOS devices appear to fit  well. Pt able to don and doff without assistive devices by end of session. She demonstrates understanding of wear regime and laundry instructions. Pt will fully assess at home ofver the next few days and report any problems ASAP. She's due back next week. Plan to fit remade capri length panty leggings ASAP, then decrease OT to F/U.   Rehab Potential Good   OT Frequency 1x / week   OT Duration 12 weeks   OT Treatment/Interventions Self-care/ADL training;Therapeutic exercise;Patient/family education;Manual Therapy;Manual lymph drainage;Other (comment);DME and/or AE instruction;Compression bandaging;Therapeutic activities  skin care   OT Home Exercise Plan simple self MLD to BLE 1 x daily-full sequence; BLE lymphatic pumping ther ex 1 x daily- 10 reps each in sequence; full time (23/7)  short stretch compression  wraps daily until custom garments delivered and fitted; flxitouch sequential pneumatic compression device on alternating legs daily and PRN; daily skin care   Consulted and Agree with Plan of Care Patient      Patient will benefit from skilled therapeutic intervention in order to improve the following deficits and impairments:  Decreased skin integrity, Decreased activity tolerance, Decreased knowledge of use of DME, Impaired flexibility, Decreased mobility, Difficulty walking, Obesity, Increased edema, Pain, Abnormal gait, Decreased knowledge of precautions, Decreased balance  Visit Diagnosis: Lymphedema, not elsewhere classified    Problem List There are no active problems to display for this patient.   Andrey Spearman, MS, OTR/L, [facility] 09/03/16 5:12 PM  [place] [facility] REHAB SERVICES [place]  [place], [place], [address] Phone: [phone]   Fax:  [phone]  Name: Danielle Lin MRN: [id number] Date of Birth: Jun 16, 1975

## 2016-09-07 ENCOUNTER — Encounter: Payer: Managed Care, Other (non HMO) | Admitting: Occupational Therapy

## 2016-09-12 ENCOUNTER — Ambulatory Visit: Payer: Managed Care, Other (non HMO) | Admitting: Occupational Therapy

## 2016-09-12 DIAGNOSIS — I890 Lymphedema, not elsewhere classified: Secondary | ICD-10-CM

## 2016-09-12 NOTE — Therapy (Signed)
[place] MAIN [facility] SERVICES [address] [place], [place], [address] Phone: [phone]   Fax:  [phone]  Occupational Therapy Treatment  Patient Details  Name: Danielle Lin MRN: [id number] Date of Birth: 03/12/1976 No Data Recorded  Encounter Date: 09/12/2016      OT End of Session - 09/12/16 1500    Visit Number 33   Number of Visits 72   Date for OT Re-Evaluation 08/30/16   Authorization Type 24 for 2018   Authorization Time Period additional 12 weeks starting 06/08/16   OT Start Time 0805   OT Stop Time 0910   OT Time Calculation (min) 65 min   Equipment Utilized During Treatment Tyveck slippie sock, Juzo friction matt, Easy slide, rubber palm gloves   Activity Tolerance Patient tolerated treatment well;No increased pain   Behavior During Therapy WFL for tasks assessed/performed      Past Medical History:  Diagnosis Date  . Hypertension     Past Surgical History:  Procedure Laterality Date  . CARPAL TUNNEL RELEASE Bilateral   . CHOLECYSTECTOMY    . GASTROPLASTY DUODENAL SWITCH      There were no vitals filed for this visit.      Subjective Assessment - 09/12/16 1455    Subjective  Pt presents for OT v once weekly visit 72 /72 visits ( visit 24 for 2018) to address sever, stage 4, BLE lipo-lymphedema. Pt reports Jobst RELAX HOS devices are very comfortable over all for sleep, but top edges tend to creep down. Pt is very pleased w/ both knee high remakes. DME vendor confirmed Pt does not need to return initial compression garments.   Patient is accompained by: Family member   Pertinent History SIPPS sx 04/25/15 w/ weight at 306 lbs. Current weight 212.6 lbs. HTN, Asthma, OSA ( uses CPAP) B CTR   Limitations BLE chronic leg pain and swelling, L>R, difficulty walking, decreased standing tolerance, decreased activity tolerance, difficulty w/ functional mobility/ transfers   Patient Stated Goals decrease leg swelling and  pain and keep it from getting worse.   Currently in Pain? No/denies   Pain Onset More than a month ago                      OT Treatments/Exercises (OP) - 09/12/16 0001      ADLs   ADL Education Given Yes     Manual Therapy   Manual Therapy Edema management   Manual therapy comments COMPLETED NEW MEASUREMENTS AT cF AND [facility] , and at overall length of RELAX garments to improve fit. Transmitted remake infstructions to vendor.   Edema Management ble KNEE LENGTH COMPRESSION AFTER SESSION. pT INDEPENDENT   Manual Lymphatic Drainage (MLD) MLD to BLE as established.                OT Education - 09/12/16 1457    Education provided Yes   Education Details Cont LE self care edu- emphasis today on HOD device      wear and care recommendations   Person(s) Educated Patient   Methods Explanation   Comprehension Verbalized understanding             OT Long Term Goals - 08/15/16 1158      OT LONG TERM GOAL #1   Title Lymphedema (LE) management/ self-care: Pt able to apply multi layered, gradient compression wraps with min caregiver assistance using proper techniques within 2 weeks to achieve optimal limb volume reduction.  Baseline dependent for LE self care   Time 2   Period Weeks   Status Achieved     OT LONG TERM GOAL #2   Title Lymphedema (LE) management/ self-care:  Pt to achieve at least 10% LLE limb volume reductions bilaterally during Intensive CDT to limit LE progression, decrease infection and falls risk, to reduce pain/, and to improve safe ambulation and functional mobility.   Baseline dependent for LE self care   Time 12   Period Weeks   Status Partially Met     OT LONG TERM GOAL #3   Title Lymphedema (LE) management/ self-care:  Pt >/= 85 % compliant with all daily, LE self-care protocols for home program w/ needed level of caregiver assistance , including simple self-manual lymphatic drainage (MLD), skin care, lymphatic pumping the ex,  skin care, and donning/ doffing compression wraps and garments o limit LE progression and further functional decline.     Baseline dependent for LE self care   Time 12   Period Weeks   Status Achieved     OT LONG TERM GOAL #4   Title Lymphedema (LE) management/ self-care:  Pt to tolerate daily compression wraps, garments and devices in keeping w/ prescribed wear regime within 1 week of issue date to progress and retain clinical and functional gains and to limit LE progression.   Baseline dependent for LE self care   Time 12   Period Weeks   Status Partially Met     OT LONG TERM GOAL #5   Title Lymphedema (LE) self-care:  During Management Phase CDT Pt to sustain limb volume reductions achieved during Intensive Phase CDT within 5% utilizing LE self-care protocols, appropriate compression garments/ devices, and needed level of caregiver assistance.   Baseline dependent for LE self care   Time 6   Period Months   Status On-going             Patient will benefit from skilled therapeutic intervention in order to improve the following deficits and impairments:     Visit Diagnosis: Lymphedema, not elsewhere classified    Problem List There are no active problems to display for this patient.   Andrey Spearman, MS, OTR/L, [facility] 09/12/16 3:01 PM  [place] MAIN [facility] SERVICES [address] [place], [place], [address] Phone: [phone]   Fax:  [phone]  Name: Ludell Zacarias MRN: [id number] Date of Birth: 1975-10-19

## 2016-09-19 ENCOUNTER — Encounter: Payer: Managed Care, Other (non HMO) | Admitting: Occupational Therapy

## 2016-09-26 ENCOUNTER — Ambulatory Visit: Payer: Managed Care, Other (non HMO) | Admitting: Occupational Therapy

## 2016-09-26 DIAGNOSIS — I890 Lymphedema, not elsewhere classified: Secondary | ICD-10-CM

## 2016-09-26 NOTE — Therapy (Signed)
[place] MAIN [facility] SERVICES [address] [place], [place], [address] Phone: [phone]   Fax:  [phone]  Occupational Therapy Treatment  Patient Details  Name: Danielle Lin MRN: [id number] Date of Birth: Jun 15, 1975 No Data Recorded  Encounter Date: 09/26/2016      OT End of Session - 09/26/16 1342    Visit Number 70   Number of Visits 58   Date for OT Re-Evaluation 08/30/16   Authorization Type 25 for 2018   Authorization Time Period additional 12 weeks starting 06/08/16   OT Start Time 0810   OT Stop Time 0910   OT Time Calculation (min) 60 min   Equipment Utilized During Treatment Tyveck slippie sock, Juzo friction matt, Easy slide, rubber palm gloves   Activity Tolerance Patient tolerated treatment well;No increased pain   Behavior During Therapy WFL for tasks assessed/performed      Past Medical History:  Diagnosis Date  . Hypertension     Past Surgical History:  Procedure Laterality Date  . CARPAL TUNNEL RELEASE Bilateral   . CHOLECYSTECTOMY    . GASTROPLASTY DUODENAL SWITCH      There were no vitals filed for this visit.      Subjective Assessment - 09/26/16 1339    Subjective  Pt presents for OT v once weekly visit 50 /72 visits ( visit 25 for 2018) to address sever, stage 4, BLE lipo-lymphedema. Pt reports remade RELAX HOS device has not yet arrived. Pt reports feeling unwell due to a cold today.   Patient is accompained by: Family member   Pertinent History SIPPS sx 04/25/15 w/ weight at 306 lbs. Current weight 212.6 lbs. HTN, Asthma, OSA ( uses CPAP) B CTR   Limitations BLE chronic leg pain and swelling, L>R, difficulty walking, decreased standing tolerance, decreased activity tolerance, difficulty w/ functional mobility/ transfers   Patient Stated Goals decrease leg swelling and pain and keep it from getting worse.   Currently in Pain? Yes  not rated numerically   Pain Onset More than a month ago                       OT Treatments/Exercises (OP) - 09/26/16 0001      ADLs   ADL Education Given Yes     Manual Therapy   Manual Therapy Edema management;Manual Lymphatic Drainage (MLD)   Manual Lymphatic Drainage (MLD) MLD to LLE as established.   Compression Bandaging No compression after MLD today due to Pt feeling unwell and returning home to bed.                OT Education - 09/26/16 1341    Education provided Yes   Education Details Continued skilled Pt/caregiver education  And LE ADL training throughout visit for lymphedema self care/ home program, including compression wrapping, compression garment and device wear/care, lymphatic pumping ther ex, simple self-MLD, and skin care. Discussed progress towards goals.   Person(s) Educated Patient   Methods Explanation   Comprehension Verbalized understanding             OT Long Term Goals - 08/15/16 1158      OT LONG TERM GOAL #1   Title Lymphedema (LE) management/ self-care: Pt able to apply multi layered, gradient compression wraps with min caregiver assistance using proper techniques within 2 weeks to achieve optimal limb volume reduction.   Baseline dependent for LE self care   Time 2   Period Weeks   Status  Achieved     OT LONG TERM GOAL #2   Title Lymphedema (LE) management/ self-care:  Pt to achieve at least 10% LLE limb volume reductions bilaterally during Intensive CDT to limit LE progression, decrease infection and falls risk, to reduce pain/, and to improve safe ambulation and functional mobility.   Baseline dependent for LE self care   Time 12   Period Weeks   Status Partially Met     OT LONG TERM GOAL #3   Title Lymphedema (LE) management/ self-care:  Pt >/= 85 % compliant with all daily, LE self-care protocols for home program w/ needed level of caregiver assistance , including simple self-manual lymphatic drainage (MLD), skin care, lymphatic pumping the ex, skin care, and donning/  doffing compression wraps and garments o limit LE progression and further functional decline.     Baseline dependent for LE self care   Time 12   Period Weeks   Status Achieved     OT LONG TERM GOAL #4   Title Lymphedema (LE) management/ self-care:  Pt to tolerate daily compression wraps, garments and devices in keeping w/ prescribed wear regime within 1 week of issue date to progress and retain clinical and functional gains and to limit LE progression.   Baseline dependent for LE self care   Time 12   Period Weeks   Status Partially Met     OT LONG TERM GOAL #5   Title Lymphedema (LE) self-care:  During Management Phase CDT Pt to sustain limb volume reductions achieved during Intensive Phase CDT within 5% utilizing LE self-care protocols, appropriate compression garments/ devices, and needed level of caregiver assistance.   Baseline dependent for LE self care   Time 6   Period Months   Status On-going               Plan - 09/26/16 1342    Clinical Impression Statement Pt complaining of L knee pain with movement. L knee  palpabley warm, most likely inflamation 2/2 arthritis. Pt has no c/o reguarding custom garments at present. We are awaiting delivery of Jobst Relax remakes. Pt had no difficulty tolerating OT today, despite feeling unwell 2/2  summer cold. Cont as per POC. Pt verbalizes understanding  of plan: Once remake is fit got HOS devices we will F/u in a week, then a month, then DC OT is Pt remains stable.   Rehab Potential Good   OT Frequency 1x / week   OT Duration 12 weeks   OT Treatment/Interventions Self-care/ADL training;Therapeutic exercise;Patient/family education;Manual Therapy;Manual lymph drainage;Other (comment);DME and/or AE instruction;Compression bandaging;Therapeutic activities  skin care   OT Home Exercise Plan simple self MLD to BLE 1 x daily-full sequence; BLE lymphatic pumping ther ex 1 x daily- 10 reps each in sequence; full time (23/7)  short stretch  compression wraps daily until custom garments delivered and fitted; flxitouch sequential pneumatic compression device on alternating legs daily and PRN; daily skin care   Consulted and Agree with Plan of Care Patient      Patient will benefit from skilled therapeutic intervention in order to improve the following deficits and impairments:  Decreased skin integrity, Decreased activity tolerance, Decreased knowledge of use of DME, Impaired flexibility, Decreased mobility, Difficulty walking, Obesity, Increased edema, Pain, Abnormal gait, Decreased knowledge of precautions, Decreased balance  Visit Diagnosis: Lymphedema, not elsewhere classified    Problem List There are no active problems to display for this patient.   Andrey Spearman, MS, OTR/L, CLT-LANA 09/26/16 1:46 PM  [place] MAIN [facility] SERVICES [address] [place], [place], [address] Phone: [phone]   Fax:  [phone]  Name: Danielle Lin MRN: [id number] Date of Birth: 1976/01/26

## 2016-09-27 ENCOUNTER — Encounter: Payer: Managed Care, Other (non HMO) | Admitting: Occupational Therapy

## 2016-09-28 ENCOUNTER — Encounter: Payer: Managed Care, Other (non HMO) | Admitting: Occupational Therapy

## 2016-10-03 ENCOUNTER — Ambulatory Visit: Payer: Managed Care, Other (non HMO) | Attending: Family Medicine | Admitting: Occupational Therapy

## 2016-10-03 DIAGNOSIS — I890 Lymphedema, not elsewhere classified: Secondary | ICD-10-CM | POA: Insufficient documentation

## 2016-10-03 NOTE — Therapy (Signed)
[place] MAIN [facility] SERVICES [address] [place], [place], [address] Phone: [phone]   Fax:  [phone]  Occupational Therapy Treatment  Patient Details  Name: Danielle Lin MRN: [id number] Date of Birth: 04-18-1976 No Data Recorded  Encounter Date: 10/03/2016      OT End of Session - 10/03/16 0958    Visit Number 5   Number of Visits 55   Date for OT Re-Evaluation 08/30/16   Authorization Type 26 for 2018   Authorization Time Period additional 12 weeks starting 06/08/16   OT Start Time 0805   OT Stop Time 0905   OT Time Calculation (min) 60 min   Equipment Utilized During Treatment Tyveck slippie sock, Juzo friction matt, Easy slide, rubber palm gloves   Activity Tolerance Patient tolerated treatment well;No increased pain   Behavior During Therapy WFL for tasks assessed/performed      Past Medical History:  Diagnosis Date  . Hypertension     Past Surgical History:  Procedure Laterality Date  . CARPAL TUNNEL RELEASE Bilateral   . CHOLECYSTECTOMY    . GASTROPLASTY DUODENAL SWITCH      There were no vitals filed for this visit.      Subjective Assessment - 10/03/16 0900    Subjective  Pt presents for OT v once weekly visit 56 /72 visits ( visit 26 for 2018) to address sever, stage 4, BLE lipo-lymphedema. Pt reports remade RELAX HOS device has still  not yet arriived.   Patient is accompained by: Family member   Pertinent History SIPPS sx 04/25/15 w/ weight at 306 lbs. Current weight 212.6 lbs. HTN, Asthma, OSA ( uses CPAP) B CTR   Limitations BLE chronic leg pain and swelling, L>R, difficulty walking, decreased standing tolerance, decreased activity tolerance, difficulty w/ functional mobility/ transfers   Patient Stated Goals decrease leg swelling and pain and keep it from getting worse.   Currently in Pain? No/denies   Pain Onset More than a month ago                      OT Treatments/Exercises (OP)  - 10/03/16 0001      ADLs   ADL Education Given Yes     Manual Therapy   Manual Therapy Edema management;Manual Lymphatic Drainage (MLD)   Edema Management ble KNEE LENGTH COMPRESSION AFTER SESSION. pT INDEPENDENT   Manual Lymphatic Drainage (MLD) MLD to LLE as established.   Compression Bandaging No compression after MLD today due to Pt feeling unwell and returning home to bed.                OT Education - 10/03/16 [phone]    Education provided Yes   Education Details Continued skilled Pt/caregiver education  And LE ADL training throughout visit for lymphedema self care/ home program, including compression wrapping, compression garment and device wear/care, lymphatic pumping ther ex, simple self-MLD, and skin care. Discussed progress towards goals.   Person(s) Educated Patient   Methods Explanation;Demonstration   Comprehension Verbalized understanding             OT Long Term Goals - 08/15/16 1158      OT LONG TERM GOAL #1   Title Lymphedema (LE) management/ self-care: Pt able to apply multi layered, gradient compression wraps with min caregiver assistance using proper techniques within 2 weeks to achieve optimal limb volume reduction.   Baseline dependent for LE self care   Time 2   Period Weeks  Status Achieved     OT LONG TERM GOAL #2   Title Lymphedema (LE) management/ self-care:  Pt to achieve at least 10% LLE limb volume reductions bilaterally during Intensive CDT to limit LE progression, decrease infection and falls risk, to reduce pain/, and to improve safe ambulation and functional mobility.   Baseline dependent for LE self care   Time 12   Period Weeks   Status Partially Met     OT LONG TERM GOAL #3   Title Lymphedema (LE) management/ self-care:  Pt >/= 85 % compliant with all daily, LE self-care protocols for home program w/ needed level of caregiver assistance , including simple self-manual lymphatic drainage (MLD), skin care, lymphatic pumping the ex,  skin care, and donning/ doffing compression wraps and garments o limit LE progression and further functional decline.     Baseline dependent for LE self care   Time 12   Period Weeks   Status Achieved     OT LONG TERM GOAL #4   Title Lymphedema (LE) management/ self-care:  Pt to tolerate daily compression wraps, garments and devices in keeping w/ prescribed wear regime within 1 week of issue date to progress and retain clinical and functional gains and to limit LE progression.   Baseline dependent for LE self care   Time 12   Period Weeks   Status Partially Met     OT LONG TERM GOAL #5   Title Lymphedema (LE) self-care:  During Management Phase CDT Pt to sustain limb volume reductions achieved during Intensive Phase CDT within 5% utilizing LE self-care protocols, appropriate compression garments/ devices, and needed level of caregiver assistance.   Baseline dependent for LE self care   Time 6   Period Months   Status On-going               Plan - 10/03/16 0959    Clinical Impression Statement Pt reporting HOS devices remade to improve fit have still not arrived. She continues to use existing night time devices . Pt managing LE between visits well with custom garments, devices, and self care strategies and techniques. She tolerated MLD today without difficulty. She requested assistance with donning compression garments after Rx due to soreness in hands. Cont as per POC. Once remade HOS devices are fitted decrease OT freqency to F/U prn.   Occupational performance deficits (Please refer to evaluation for details): ADL's;IADL's;Rest and Sleep;Work;Leisure;Social Participation;Other  body image   Rehab Potential Good   OT Frequency 1x / week   OT Duration 12 weeks   OT Treatment/Interventions Self-care/ADL training;Therapeutic exercise;Patient/family education;Manual Therapy;Manual lymph drainage;Other (comment);DME and/or AE instruction;Compression bandaging;Therapeutic activities   skin care   OT Home Exercise Plan simple self MLD to BLE 1 x daily-full sequence; BLE lymphatic pumping ther ex 1 x daily- 10 reps each in sequence; full time (23/7)  short stretch compression wraps daily until custom garments delivered and fitted; flxitouch sequential pneumatic compression device on alternating legs daily and PRN; daily skin care   Consulted and Agree with Plan of Care Patient      Patient will benefit from skilled therapeutic intervention in order to improve the following deficits and impairments:  Decreased skin integrity, Decreased activity tolerance, Decreased knowledge of use of DME, Impaired flexibility, Decreased mobility, Difficulty walking, Obesity, Increased edema, Pain, Abnormal gait, Decreased knowledge of precautions, Decreased balance  Visit Diagnosis: Lymphedema, not elsewhere classified    Problem List There are no active problems to display for this patient.  Andrey Spearman, MS, OTR/L, [facility] 10/03/16 10:03 AM  [place] MAIN [facility] SERVICES [address] [place], [place], [address] Phone: [phone]   Fax:  [phone]  Name: Maddalynn Barnard MRN: [id number] Date of Birth: 07-16-1975

## 2016-10-10 ENCOUNTER — Ambulatory Visit: Payer: Managed Care, Other (non HMO) | Admitting: Occupational Therapy

## 2016-10-15 ENCOUNTER — Ambulatory Visit: Payer: Managed Care, Other (non HMO) | Admitting: Occupational Therapy

## 2016-10-15 DIAGNOSIS — I890 Lymphedema, not elsewhere classified: Secondary | ICD-10-CM

## 2016-10-15 NOTE — Therapy (Signed)
[facility] MAIN [facility] SERVICES [address] [place], [place], [address] Phone: [phone]   Fax:  [phone]  Occupational Therapy Treatment  Patient Details  Name: Danielle Lin MRN: [id number] Date of Birth: 30-Dec-1975 No Data Recorded  Encounter Date: 10/15/2016      OT End of Session - 10/15/16 1746    Visit Number 57   Number of Visits 72   Date for OT Re-Evaluation 08/30/16   Authorization Type 26 for 2018   Authorization Time Period additional 12 weeks starting 06/08/16   OT Start Time 0315   OT Stop Time 0415   OT Time Calculation (min) 60 min   Equipment Utilized During Treatment Tyveck slippie sock, Juzo friction matt, Easy slide, rubber palm gloves   Activity Tolerance Patient tolerated treatment well;No increased pain   Behavior During Therapy WFL for tasks assessed/performed      Past Medical History:  Diagnosis Date  . Hypertension     Past Surgical History:  Procedure Laterality Date  . CARPAL TUNNEL RELEASE Bilateral   . CHOLECYSTECTOMY    . GASTROPLASTY DUODENAL SWITCH      There were no vitals filed for this visit.      Subjective Assessment - 10/15/16 1744    Subjective  Pt presents for OT v once weekly visit 57/72 visits ( visit 27 for 2018) to address sever, stage 4, BLE lipo-lymphedema. Pt reports remade RELAX HOS device has still  not yet arriived. Pt reports increased LLE knee pain.   Patient is accompained by: Family member   Pertinent History SIPPS sx 04/25/15 w/ weight at 306 lbs. Current weight 212.6 lbs. HTN, Asthma, OSA ( uses CPAP) B CTR   Limitations BLE chronic leg pain and swelling, L>R, difficulty walking, decreased standing tolerance, decreased activity tolerance, difficulty w/ functional mobility/ transfers   Patient Stated Goals decrease leg swelling and pain and keep it from getting worse.   Currently in Pain? Yes  L knee pain- not numerically rated   Pain Location Knee   Pain  Descriptors / Indicators --  stiffness and soreness w/ standing, walking, transferring   Pain Onset More than a month ago                              OT Education - 10/15/16 1745    Education provided Yes   Education Details Continued skilled Pt/caregiver education  And LE ADL training throughout visit for lymphedema self care/ home program, including compression wrapping, compression garment and device wear/care, lymphatic pumping ther ex, simple self-MLD, and skin care. Discussed progress towards goals.   Person(s) Educated Patient   Methods Explanation;Demonstration   Comprehension Verbalized understanding             OT Long Term Goals - 08/15/16 1158      OT LONG TERM GOAL #1   Title Lymphedema (LE) management/ self-care: Pt able to apply multi layered, gradient compression wraps with min caregiver assistance using proper techniques within 2 weeks to achieve optimal limb volume reduction.   Baseline dependent for LE self care   Time 2   Period Weeks   Status Achieved     OT LONG TERM GOAL #2   Title Lymphedema (LE) management/ self-care:  Pt to achieve at least 10% LLE limb volume reductions bilaterally during Intensive CDT to limit LE progression, decrease infection and falls risk, to reduce pain/, and to improve safe ambulation  and functional mobility.   Baseline dependent for LE self care   Time 12   Period Weeks   Status Partially Met     OT LONG TERM GOAL #3   Title Lymphedema (LE) management/ self-care:  Pt >/= 85 % compliant with all daily, LE self-care protocols for home program w/ needed level of caregiver assistance , including simple self-manual lymphatic drainage (MLD), skin care, lymphatic pumping the ex, skin care, and donning/ doffing compression wraps and garments o limit LE progression and further functional decline.     Baseline dependent for LE self care   Time 12   Period Weeks   Status Achieved     OT LONG TERM GOAL #4    Title Lymphedema (LE) management/ self-care:  Pt to tolerate daily compression wraps, garments and devices in keeping w/ prescribed wear regime within 1 week of issue date to progress and retain clinical and functional gains and to limit LE progression.   Baseline dependent for LE self care   Time 12   Period Weeks   Status Partially Met     OT LONG TERM GOAL #5   Title Lymphedema (LE) self-care:  During Management Phase CDT Pt to sustain limb volume reductions achieved during Intensive Phase CDT within 5% utilizing LE self-care protocols, appropriate compression garments/ devices, and needed level of caregiver assistance.   Baseline dependent for LE self care   Time 6   Period Months   Status On-going               Plan - 10/15/16 1746    Clinical Impression Statement Pt tolerated CDT today without difficulty, including LLE MLD, skin care and Pt edu for LE self care. Pt to cx next appointment if compression HOS devices have not arrived by next session to conserve visits. OT left VM for DME vendor re status report. Scheduler aware and she also assisted by leaving VM. Cont as per POC.   Rehab Potential Good   OT Frequency 1x / week   OT Duration 12 weeks   OT Treatment/Interventions Self-care/ADL training;Therapeutic exercise;Patient/family education;Manual Therapy;Manual lymph drainage;Other (comment);DME and/or AE instruction;Compression bandaging;Therapeutic activities  skin care   OT Home Exercise Plan simple self MLD to BLE 1 x daily-full sequence; BLE lymphatic pumping ther ex 1 x daily- 10 reps each in sequence; full time (23/7)  short stretch compression wraps daily until custom garments delivered and fitted; flxitouch sequential pneumatic compression device on alternating legs daily and PRN; daily skin care   Consulted and Agree with Plan of Care Patient      Patient will benefit from skilled therapeutic intervention in order to improve the following deficits and  impairments:  Decreased skin integrity, Decreased activity tolerance, Decreased knowledge of use of DME, Impaired flexibility, Decreased mobility, Difficulty walking, Obesity, Increased edema, Pain, Abnormal gait, Decreased knowledge of precautions, Decreased balance  Visit Diagnosis: Lymphedema, not elsewhere classified    Problem List There are no active problems to display for this patient.  Andrey Spearman, MS, OTR/L, [facility] 10/15/16 5:50 PM  [place] MAIN [facility] SERVICES [address] [place], [place], [address] Phone: [phone]   Fax:  [phone]  Name: Danielle Lin MRN: [id number] Date of Birth: 10/30/1975

## 2016-10-24 ENCOUNTER — Encounter: Payer: Managed Care, Other (non HMO) | Admitting: Occupational Therapy

## 2016-11-01 ENCOUNTER — Ambulatory Visit: Payer: Managed Care, Other (non HMO) | Attending: Family Medicine | Admitting: Occupational Therapy

## 2016-11-01 DIAGNOSIS — I890 Lymphedema, not elsewhere classified: Secondary | ICD-10-CM

## 2016-11-01 NOTE — Therapy (Signed)
[name] [facility] SERVICES [address] [place], [place], [address] Phone: [phone]   Fax:  [phone]  Occupational Therapy Treatment  Patient Details  Name: Danielle Lin MRN: [id number] Date of Birth: 11/07/1975 No Data Recorded  Encounter Date: 11/01/2016      OT End of Session - 11/01/16 1517    Visit Number 66   Number of Visits 25   Date for OT Re-Evaluation 08/30/16   Authorization Type 27 for 2018   Authorization Time Period additional 12 weeks starting 06/08/16   OT Start Time 0809   OT Stop Time 0910   OT Time Calculation (min) 61 min   Equipment Utilized During Treatment Tyveck slippie sock, Juzo friction matt, Easy slide, rubber palm gloves   Activity Tolerance Patient tolerated treatment well;No increased pain   Behavior During Therapy WFL for tasks assessed/performed      Past Medical History:  Diagnosis Date  . Hypertension     Past Surgical History:  Procedure Laterality Date  . CARPAL TUNNEL RELEASE Bilateral   . CHOLECYSTECTOMY    . GASTROPLASTY DUODENAL SWITCH      There were no vitals filed for this visit.      Subjective Assessment - 11/01/16 0824    Subjective  (P)  Pt presents for OT v once weekly visit 58/72 visits ( visit 27 for 2018) to address sever, stage 4, BLE lipo-lymphedema. Pt wears replacement HOS devices to clinic and reports that they are too lose and fall down from  the top edge.   Patient is accompained by: (P)  Family member   Pertinent History (P)  SIPPS sx 04/25/15 w/ weight at 306 lbs. Current weight 212.6 lbs. HTN, Asthma, OSA ( uses CPAP) B CTR   Limitations (P)  BLE chronic leg pain and swelling, L>R, difficulty walking, decreased standing tolerance, decreased activity tolerance, difficulty w/ functional mobility/ transfers   Patient Stated Goals (P)  decrease leg swelling and pain and keep it from getting worse.   Pain Onset (P)  More than a month ago                       OT Treatments/Exercises (OP) - 11/01/16 0001      ADLs   ADL Education Given Yes     Manual Therapy   Manual Therapy Edema management;Manual Lymphatic Drainage (MLD)   Manual therapy comments assessed remade HOD garments- Jobst RELAX   Manual Lymphatic Drainage (MLD) MLD to LLE as established.   Compression Bandaging Pt donned compression garments after session                OT Education - 11/01/16 1516    Education provided Yes   Education Details Continued skilled Pt/caregiver education  And LE ADL training throughout visit for lymphedema self care/ home program, including compression wrapping, compression garment and device wear/care, lymphatic pumping ther ex, simple self-MLD, and skin care. Discussed progress towards goals.   Person(s) Educated Patient   Methods Explanation;Demonstration   Comprehension Verbalized understanding             OT Long Term Goals - 08/15/16 1158      OT LONG TERM GOAL #1   Title Lymphedema (LE) management/ self-care: Pt able to apply multi layered, gradient compression wraps with min caregiver assistance using proper techniques within 2 weeks to achieve optimal limb volume reduction.   Baseline dependent for LE self care   Time 2  Period Weeks   Status Achieved     OT LONG TERM GOAL #2   Title Lymphedema (LE) management/ self-care:  Pt to achieve at least 10% LLE limb volume reductions bilaterally during Intensive CDT to limit LE progression, decrease infection and falls risk, to reduce pain/, and to improve safe ambulation and functional mobility.   Baseline dependent for LE self care   Time 12   Period Weeks   Status Partially Met     OT LONG TERM GOAL #3   Title Lymphedema (LE) management/ self-care:  Pt >/= 85 % compliant with all daily, LE self-care protocols for home program w/ needed level of caregiver assistance , including simple self-manual lymphatic drainage (MLD), skin care,  lymphatic pumping the ex, skin care, and donning/ doffing compression wraps and garments o limit LE progression and further functional decline.     Baseline dependent for LE self care   Time 12   Period Weeks   Status Achieved     OT LONG TERM GOAL #4   Title Lymphedema (LE) management/ self-care:  Pt to tolerate daily compression wraps, garments and devices in keeping w/ prescribed wear regime within 1 week of issue date to progress and retain clinical and functional gains and to limit LE progression.   Baseline dependent for LE self care   Time 12   Period Weeks   Status Partially Met     OT LONG TERM GOAL #5   Title Lymphedema (LE) self-care:  During Management Phase CDT Pt to sustain limb volume reductions achieved during Intensive Phase CDT within 5% utilizing LE self-care protocols, appropriate compression garments/ devices, and needed level of caregiver assistance.   Baseline dependent for LE self care   Time 6   Period Months   Status On-going               Plan - 11/01/16 1518    Clinical Impression Statement Assessed  thigh length HOS device, (Jobst RELAX) for fit and function. Despite decreasing circumferences at both F and G landmarks, and adding a slight bit more length at G, devices are too short and continuously fall down from top edge causing bunching at knees and ankles. Pt reports she wore them to bed last night for the first time and they would not remain  in place. Pt reports original devices actially stay in place better than these remakes. OT reported this problem to DME vendor and requested advice as this is a new product. Completed session w/ MLD to LLE. Pt in agreement w/ plan to return for fitting follow up if manufacturer deems remake necessary. If no remake fitting, then we'll schedule a 3 month follow up to check progress towards LE self- Management Phase goals.   Occupational performance deficits (Please refer to evaluation for details): ADL's;IADL's;Rest  and Sleep;Work;Leisure;Social Participation   Rehab Potential Good   OT Frequency One time visit  one time fitting follow up, then 3 month follow along   OT Treatment/Interventions Self-care/ADL training;Therapeutic exercise;Patient/family education;Manual Therapy;Manual lymph drainage;Other (comment);DME and/or AE instruction;Compression bandaging;Therapeutic activities  skin care   OT Home Exercise Plan simple self MLD to BLE 1 x daily-full sequence; BLE lymphatic pumping ther ex 1 x daily- 10 reps each in sequence; full time (23/7)  short stretch compression wraps daily until custom garments delivered and fitted; flxitouch sequential pneumatic compression device on alternating legs daily and PRN; daily skin care   Consulted and Agree with Plan of Care Patient  Patient will benefit from skilled therapeutic intervention in order to improve the following deficits and impairments:  Decreased skin integrity, Decreased activity tolerance, Decreased knowledge of use of DME, Impaired flexibility, Decreased mobility, Difficulty walking, Obesity, Increased edema, Pain, Abnormal gait, Decreased knowledge of precautions, Decreased balance  Visit Diagnosis: Lymphedema, not elsewhere classified    Problem List There are no active problems to display for this patient.  Andrey Spearman, MS, OTR/L, [facility] 11/01/16 3:24 PM   [place] MAIN [facility] SERVICES [address] [place], [place], [address] Phone: [phone]   Fax:  [phone]  Name: Rebecka Oelkers MRN: [id number] Date of Birth: [birth year]

## 2017-01-24 ENCOUNTER — Encounter: Payer: Self-pay | Admitting: Occupational Therapy

## 2017-01-24 NOTE — Therapy (Signed)
[place] MAIN [facility] SERVICES [address] [place], [place], [address] Phone: [phone]   Fax:  [phone]  Occupational Therapy Discharge Summary  Patient Details  Name: Danielle Lin MRN: [id number] Date of Birth: 07-Sep-1975 No Data Recorded  Encounter Date: 01/24/2017    Past Medical History:  Diagnosis Date  . Hypertension     Past Surgical History:  Procedure Laterality Date  . CARPAL TUNNEL RELEASE Bilateral   . CHOLECYSTECTOMY    . GASTROPLASTY DUODENAL SWITCH      There were no vitals filed for this visit.                                 OT Long Term Goals - 01/24/17 1019      OT LONG TERM GOAL #1   Title Lymphedema (LE) management/ self-care: Pt able to apply multi layered, gradient compression wraps with min caregiver assistance using proper techniques within 2 weeks to achieve optimal limb volume reduction.   Baseline dependent for LE self care   Time 2   Period Weeks   Status Achieved     OT LONG TERM GOAL #2   Title Lymphedema (LE) management/ self-care:  Pt to achieve at least 10% LLE limb volume reductions bilaterally during Intensive CDT to limit LE progression, decrease infection and falls risk, to reduce pain/, and to improve safe ambulation and functional mobility.   Baseline dependent for LE self care   Time 12   Period Weeks   Status Achieved     OT LONG TERM GOAL #3   Title Lymphedema (LE) management/ self-care:  Pt >/= 85 % compliant with all daily, LE self-care protocols for home program w/ needed level of caregiver assistance , including simple self-manual lymphatic drainage (MLD), skin care, lymphatic pumping the ex, skin care, and donning/ doffing compression wraps and garments o limit LE progression and further functional decline.     Baseline dependent for LE self care   Time 12   Period Weeks   Status Achieved     OT LONG TERM GOAL #4   Title Lymphedema (LE)  management/ self-care:  Pt to tolerate daily compression wraps, garments and devices in keeping w/ prescribed wear regime within 1 week of issue date to progress and retain clinical and functional gains and to limit LE progression.   Baseline dependent for LE self care   Time 12   Period Weeks   Status Achieved     OT LONG TERM GOAL #5   Title Lymphedema (LE) self-care:  During Management Phase CDT Pt to sustain limb volume reductions achieved during Intensive Phase CDT within 5% utilizing LE self-care protocols, appropriate compression garments/ devices, and needed level of caregiver assistance.   Baseline dependent for LE self care   Time 6   Period Months   Status On-going               Plan - 01/24/17 1017    Clinical Impression Statement Pt was last seen for custom  BLE compression garment fitting. She did not return for scheduled 1 month follow up. Pt sucessfully completed Intensive Phase Complete Decongestive Therapy and all goals were met. Pt is discharged from Occupational Therapy for lymphedema care.    Rehab Potential Good   OT Frequency --  one time fitting follow up, then 3 month follow along   OT Treatment/Interventions Self-care/ADL training;Therapeutic exercise;Patient/family education;Manual Therapy;Manual lymph  drainage;Other (comment);DME and/or AE instruction;Compression bandaging;Therapeutic activities  skin care   OT Home Exercise Plan simple self MLD to BLE 1 x daily-full sequence; BLE lymphatic pumping ther ex 1 x daily- 10 reps each in sequence; full time (23/7)  short stretch compression wraps daily until custom garments delivered and fitted; flxitouch sequential pneumatic compression device on alternating legs daily and PRN; daily skin care   Consulted and Agree with Plan of Care Patient      Patient will benefit from skilled therapeutic intervention in order to improve the following deficits and impairments:  Decreased skin integrity, Decreased activity  tolerance, Decreased knowledge of use of DME, Impaired flexibility, Decreased mobility, Difficulty walking, Obesity, Increased edema, Pain, Abnormal gait, Decreased knowledge of precautions, Decreased balance  Visit Diagnosis: No diagnosis found.    Problem List There are no active problems to display for this patient.  Andrey Spearman, MS, OTR/L, [facility] 01/24/17 10:21 AM  [place] MAIN [facility] SERVICES [address] [place], [place], [address] Phone: [phone]   Fax:  [phone]  Name: Danielle Lin MRN: [id number] Date of Birth: 1975-12-19

## 2017-07-11 ENCOUNTER — Emergency Department
Admission: EM | Admit: 2017-07-11 | Discharge: 2017-07-11 | Disposition: A | Discharge status: Home / Self Care | Payer: Worker's Compensation | Attending: Student in an Organized Health Care Education/Training Program | Admitting: Student in an Organized Health Care Education/Training Program

## 2017-07-11 ENCOUNTER — Encounter: Payer: Self-pay | Admitting: *Deleted

## 2017-07-11 DIAGNOSIS — Z77098 Contact with and (suspected) exposure to other hazardous, chiefly nonmedicinal, chemicals: Secondary | ICD-10-CM | POA: Diagnosis present

## 2017-07-11 DIAGNOSIS — I10 Essential (primary) hypertension: Secondary | ICD-10-CM | POA: Insufficient documentation

## 2017-07-11 DIAGNOSIS — Z79899 Other long term (current) drug therapy: Secondary | ICD-10-CM | POA: Insufficient documentation

## 2017-07-11 MED ORDER — BACITRACIN-POLYMYXIN B 500-10000 UNIT/GM OP OINT: TOPICAL_OINTMENT | Freq: Two times a day (BID) | OPHTHALMIC | 0 refills | Status: AC

## 2017-07-11 MED ORDER — FLUORESCEIN SODIUM 1 MG OP STRP
ORAL_STRIP | OPHTHALMIC | Status: AC
  Filled 2017-07-11: qty 1

## 2017-07-11 MED ORDER — TETRACAINE HCL 0.5 % OP SOLN
2.0000 [drp] | Freq: Once | OPHTHALMIC | Status: AC
  Administered 2017-07-11: 2 [drp] via OPHTHALMIC
  Filled 2017-07-11: qty 4

## 2017-07-11 MED ORDER — TRAMADOL HCL 50 MG PO TABS: 50.0000 mg | ORAL_TABLET | Freq: Four times a day (QID) | ORAL | 0 refills | Status: DC | PRN

## 2017-07-11 MED ORDER — FLUORESCEIN SODIUM 1 MG OP STRP
1.0000 | ORAL_STRIP | Freq: Once | OPHTHALMIC | Status: AC
  Administered 2017-07-11: 1 via OPHTHALMIC

## 2017-07-11 MED ORDER — TRAMADOL HCL 50 MG PO TABS
50.0000 mg | ORAL_TABLET | Freq: Once | ORAL | Status: AC
  Administered 2017-07-11: 50 mg via ORAL
  Filled 2017-07-11: qty 1

## 2017-07-11 NOTE — ED Notes (Signed)
Informed RN that patient has been roomed and is ready for evaluation.  Patient in NAD at this time and call bell placed within reach.   

## 2017-07-11 NOTE — ED Notes (Signed)
ED Provider at bedside. 

## 2017-07-11 NOTE — ED Triage Notes (Signed)
Pt reports around 6pm she had chemical exposure (TMB-tetramethylbenzidine and sulfuric acid) to both of her eyes. She was throwing away the bottles and the fluid splashed up under her face shield. She irrigated her eyes for 20 minutes and then again for another 10 minutes). She reports burning in both eye at this time.
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
## 2017-07-11 NOTE — Discharge Instructions (Signed)
Please follow-up in Dr. Inda Coke clinic tomorrow morning.  Informed the front desk that you are seen in the ER last night and that he has agreed to see you in clinic tomorrow morning for more detailed examination.  I do recommend you use topical eye ointment prescribed both to help with pain and promote healing.  Return to the ER if you have any worsening symptoms or for any additional questions or concerns.
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
## 2017-07-21 ENCOUNTER — Encounter (HOSPITAL_COMMUNITY): Payer: Self-pay | Admitting: Emergency Medicine

## 2017-07-21 ENCOUNTER — Ambulatory Visit (HOSPITAL_COMMUNITY)
Admission: EM | Admit: 2017-07-21 | Discharge: 2017-07-21 | Disposition: A | Discharge status: Home / Self Care | Payer: Worker's Compensation | Attending: Physician Assistant | Admitting: Physician Assistant

## 2017-07-21 DIAGNOSIS — H10212 Acute toxic conjunctivitis, left eye: Secondary | ICD-10-CM

## 2017-07-21 MED ORDER — ERYTHROMYCIN 5 MG/GM OP OINT: TOPICAL_OINTMENT | OPHTHALMIC | 0 refills | Status: DC

## 2017-07-21 MED ORDER — ERYTHROMYCIN 5 MG/GM OP OINT
TOPICAL_OINTMENT | OPHTHALMIC | Status: AC
Start: 2017-07-21 — End: 2017-07-21
  Filled 2017-07-21: qty 3.5

## 2017-07-21 MED ORDER — TETRACAINE HCL 0.5 % OP SOLN
OPHTHALMIC | Status: AC
Start: 2017-07-21 — End: 2017-07-21
  Filled 2017-07-21: qty 4

## 2017-07-21 MED ORDER — TRAMADOL HCL 50 MG PO TABS: 50.0000 mg | ORAL_TABLET | Freq: Four times a day (QID) | ORAL | 0 refills | Status: DC | PRN
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
## 2017-07-21 NOTE — Discharge Instructions (Signed)
Please call Dr. Zenia Resides office in the morning.  Please Place one ribbon of the ointment in your lower eye every 4-6 hours.
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
## 2018-01-21 ENCOUNTER — Other Ambulatory Visit (HOSPITAL_COMMUNITY)
Admission: RE | Admit: 2018-01-21 | Discharge: 2018-01-21 | Disposition: A | Discharge status: Home / Self Care | Payer: Managed Care, Other (non HMO) | Source: Ambulatory Visit | Attending: Family Medicine | Admitting: Family Medicine

## 2018-01-21 ENCOUNTER — Other Ambulatory Visit: Payer: Self-pay | Admitting: Physician Assistant

## 2018-01-21 DIAGNOSIS — Z01419 Encounter for gynecological examination (general) (routine) without abnormal findings: Secondary | ICD-10-CM | POA: Diagnosis not present

## 2018-01-23 LAB — CYTOLOGY - PAP: DIAGNOSIS: NEGATIVE

## 2018-07-11 ENCOUNTER — Emergency Department
Admission: EM | Admit: 2018-07-11 | Discharge: 2018-07-11 | Disposition: A | Discharge status: Home / Self Care | Payer: Managed Care, Other (non HMO) | Attending: Emergency Medicine | Admitting: Emergency Medicine

## 2018-07-11 ENCOUNTER — Other Ambulatory Visit: Payer: Self-pay

## 2018-07-11 ENCOUNTER — Encounter: Payer: Self-pay | Admitting: *Deleted

## 2018-07-11 ENCOUNTER — Emergency Department: Payer: Managed Care, Other (non HMO)

## 2018-07-11 DIAGNOSIS — N939 Abnormal uterine and vaginal bleeding, unspecified: Secondary | ICD-10-CM | POA: Insufficient documentation

## 2018-07-11 DIAGNOSIS — R102 Pelvic and perineal pain: Secondary | ICD-10-CM

## 2018-07-11 DIAGNOSIS — D259 Leiomyoma of uterus, unspecified: Secondary | ICD-10-CM

## 2018-07-11 DIAGNOSIS — Z79899 Other long term (current) drug therapy: Secondary | ICD-10-CM | POA: Diagnosis not present

## 2018-07-11 DIAGNOSIS — I10 Essential (primary) hypertension: Secondary | ICD-10-CM | POA: Diagnosis not present

## 2018-07-11 DIAGNOSIS — D509 Iron deficiency anemia, unspecified: Secondary | ICD-10-CM | POA: Diagnosis not present

## 2018-07-11 LAB — COMPREHENSIVE METABOLIC PANEL
ALBUMIN: 4.1 g/dL (ref 3.5–5.0)
ALT: 12 U/L (ref 0–44)
ANION GAP: 10 (ref 5–15)
AST: 17 U/L (ref 15–41)
Alkaline Phosphatase: 60 U/L (ref 38–126)
BILIRUBIN TOTAL: 0.8 mg/dL (ref 0.3–1.2)
BUN: 12 mg/dL (ref 6–20)
CHLORIDE: 103 mmol/L (ref 98–111)
CO2: 25 mmol/L (ref 22–32)
Calcium: 9.1 mg/dL (ref 8.9–10.3)
Creatinine, Ser: 0.66 mg/dL (ref 0.44–1.00)
GFR calc Af Amer: >60 mL/min (ref >60)
GFR calc non Af Amer: >60 mL/min (ref >60)
GLUCOSE: 82 mg/dL (ref 70–99)
Potassium: 4.1 mmol/L (ref 3.5–5.1)
SODIUM: 138 mmol/L (ref 135–145)
TOTAL PROTEIN: 8 g/dL (ref 6.5–8.1)

## 2018-07-11 LAB — CBC
HEMATOCRIT: 26.4 % — AB (ref 36.0–46.0)
HEMOGLOBIN: 7.1 g/dL — AB (ref 12.0–15.0)
MCH: 19 pg — ABNORMAL LOW (ref 26.0–34.0)
MCHC: 26.9 g/dL — AB (ref 30.0–36.0)
MCV: 70.8 fL — AB (ref 80.0–100.0)
Platelets: 371 10*3/uL (ref 150–400)
RBC: 3.73 MIL/uL — AB (ref 3.87–5.11)
RDW: 18.6 % — ABNORMAL HIGH (ref 11.5–15.5)
WBC: 5.1 10*3/uL (ref 4.0–10.5)
nRBC: 0 % (ref 0.0–0.2)

## 2018-07-11 LAB — TYPE AND SCREEN
ABO/RH(D): O POS
Antibody Screen: NEGATIVE

## 2018-07-11 LAB — IRON AND TIBC
Iron: 15 ug/dL — ABNORMAL LOW (ref 28–170)
Saturation Ratios: 3 % — ABNORMAL LOW (ref 10.4–31.8)
TIBC: 519 ug/dL — ABNORMAL HIGH (ref 250–450)
UIBC: 504 ug/dL

## 2018-07-11 LAB — HCG, QUANTITATIVE, PREGNANCY: hCG, Beta Chain, Quant, S: <1 m[IU]/mL (ref <5)

## 2018-07-11 LAB — FERRITIN: Ferritin: 2 ng/mL — ABNORMAL LOW (ref 11–307)

## 2018-07-11 MED ORDER — IRON-VIT C-VIT B12-FOLIC ACID 100-250-0.025-1 MG PO TABS: 1.0000 | ORAL_TABLET | Freq: Every day | ORAL | 2 refills | Status: AC

## 2018-07-11 MED ORDER — KETOROLAC TROMETHAMINE 30 MG/ML IJ SOLN
15.0000 mg | INTRAMUSCULAR | Status: AC
  Administered 2018-07-11: 15 mg via INTRAVENOUS
  Filled 2018-07-11: qty 1

## 2018-07-11 MED ORDER — OXYCODONE-ACETAMINOPHEN 5-325 MG PO TABS
1.0000 | ORAL_TABLET | Freq: Once | ORAL | Status: AC
  Administered 2018-07-11: 1 via ORAL
  Filled 2018-07-11: qty 1

## 2018-07-11 MED ORDER — ONDANSETRON HCL 4 MG/2ML IJ SOLN
4.0000 mg | Freq: Once | INTRAMUSCULAR | Status: AC
  Administered 2018-07-11: 4 mg via INTRAVENOUS
  Filled 2018-07-11: qty 2

## 2018-07-11 MED ORDER — NORETHINDRONE-ETH ESTRADIOL 1-35 MG-MCG PO TABS: ORAL_TABLET | ORAL | 0 refills | Status: DC

## 2018-07-11 NOTE — ED Notes (Signed)
Patient transported to Ultrasound 

## 2018-07-11 NOTE — ED Triage Notes (Signed)
Pt to ED from PCP for a blood transfusion after a Hgb result of 7.3. PT reports having three episode of vaginal bleeding int he past two months.  Pt is currently having vaginal bleeding with small blood clots. Pt reports having needed to change her pads every two hours. Pt is pale in triage but denies dizziness at this time.

## 2018-07-11 NOTE — Discharge Instructions (Signed)
Your ultrasound today is unremarkable but does show 1 very small fibroid in the uterus.  Gynecology recommends using combination hormonal birth control to stop the bleeding for now.  Take as prescribed which is 1 tablet 3 times a day for 3 days, then 1 tablet 2 times a day for 3 days, and then 1 tablet a day after that.  Please follow-up this week in clinic with Dr. Ouida Sills, call on Monday for an appointment.  Your lab tests do show severe iron deficiency which appears to have led to a chronic anemia.  Take iron supplements daily to help replace this iron and boost your red blood cell production.

## 2018-07-11 NOTE — ED Provider Notes (Signed)
[facility] Emergency Department Provider Note  ____________________________________________  Time seen: Approximately 5:01 PM  I have reviewed the triage vital signs and the nursing notes.   HISTORY  Chief Complaint Vaginal Bleeding and Anemia    HPI Danielle Lin is a 43 y.o. female with a history of hypertension bariatric surgery and cholecystectomy who comes to the ED complaining of vaginal bleeding that started yesterday.  She reports that previously she had regular periods, but in the last 2 months she has been having abnormal bleeding, with a period lasting for a week, then 2 weeks without bleeding then bleeding for a week then 2 weeks without bleeding etc.  Her doctor checked her hemoglobin level which was 7.2 yesterday, and they were concerned that this was an acute drop  and sent her to the ED for possible blood transfusion.   Patient denies any acute symptoms other than the bleeding.  No dysuria frequency urgency or vaginal discharge.  No chest pain shortness of breath dizziness or syncope.  No orthostatic symptoms.  No fatigue.  Does not currently have a gynecologist.  Gets regular Pap smears annually at her primary care clinic.     Past Medical History:  Diagnosis Date  . Hypertension      There are no active problems to display for this patient.    Past Surgical History:  Procedure Laterality Date  . BARIATRIC SURGERY    . CARPAL TUNNEL RELEASE Bilateral   . CHOLECYSTECTOMY    . GASTROPLASTY DUODENAL SWITCH       Prior to Admission medications   Medication Sig Start Date End Date Taking? Authorizing Provider  benazepril-hydrochlorthiazide (LOTENSIN HCT) 10-12.5 MG per tablet Take 1 tablet by mouth daily.    [provider]  citalopram (CELEXA) 40 MG tablet Take 40 mg by mouth daily.    [provider]  erythromycin ophthalmic ointment Place a 1/2 inch ribbon of ointment into the lower eyelid 4-6 times daily.  07/21/17   Tereasa Coop, PA-C  ferrous sulfate 325 (65 FE) MG tablet Take 325 mg by mouth daily with breakfast.    [provider]  Iron-Vit C-Vit B12-Folic Acid 846-962-9.528-4 MG TABS Take 1 tablet by mouth daily. 07/11/18   Carrie Mew, MD  norethindrone-ethinyl estradiol 1/35 ([place] 1/35, 28,) tablet Take 1 tablet by mouth 3 (three) times daily for 3 days, THEN 1 tablet 2 (two) times daily for 3 days, THEN 1 tablet daily for 7 days. 07/11/18 07/24/18  Carrie Mew, MD  omeprazole (PRILOSEC) 20 MG capsule Take 20 mg by mouth daily.    [provider]  traMADol (ULTRAM) 50 MG tablet Take 1 tablet (50 mg total) by mouth every 6 (six) hours as needed. 07/21/17   Tereasa Coop, PA-C  vitamin B-12 (CYANOCOBALAMIN) 100 MCG tablet Take 100 mcg by mouth daily.    [provider]     Allergies Patient has no known allergies.   History reviewed. No pertinent family history.  Social History Social History   Tobacco Use  . Smoking status: Never Smoker  . Smokeless tobacco: Never Used  Substance Use Topics  . Alcohol use: No  . Drug use: No    Review of Systems  Constitutional:   No fever or chills.  ENT:   No sore throat. No rhinorrhea. Cardiovascular:   No chest pain or syncope. Respiratory:   No dyspnea or cough. Gastrointestinal: Positive cramping pelvic pain without vomiting and diarrhea.  Musculoskeletal:   Negative  for focal pain or swelling All other systems reviewed and are negative except as documented above in ROS and HPI.  ____________________________________________   PHYSICAL EXAM:  VITAL SIGNS: ED Triage Vitals  Enc Vitals Group     BP 07/11/18 1146 137/82     Pulse Rate 07/11/18 1146 76     Resp 07/11/18 1146 16     Temp 07/11/18 1146 98.3 F (36.8 C)     Temp Source 07/11/18 1146 Oral     SpO2 07/11/18 1146 99 %     Weight 07/11/18 1147 218 lb (98.9 kg)     Height 07/11/18 1147 4\' 11"  (1.499 m)     Head  Circumference --      Peak Flow --      Pain Score 07/11/18 1146 0     Pain Loc --      Pain Edu? --      Excl. in [place]? --     Vital signs reviewed, nursing assessments reviewed.   Constitutional:   Alert and oriented. Non-toxic appearance. Eyes:   Conjunctivae are normal. EOMI. PERRL. ENT      Head:   Normocephalic and atraumatic.      Nose:   No congestion/rhinnorhea.       Mouth/Throat:   MMM, no pharyngeal erythema. No peritonsillar mass.       Neck:   No meningismus. Full ROM. Hematological/Lymphatic/Immunilogical:   No cervical lymphadenopathy. Cardiovascular:   RRR. Symmetric bilateral radial and DP pulses.  No murmurs. Cap refill less than 2 seconds. Respiratory:   Normal respiratory effort without tachypnea/retractions. Breath sounds are clear and equal bilaterally. No wheezes/rales/rhonchi. Gastrointestinal:   Soft with suprapubic tenderness. Non distended. There is no CVA tenderness.  No rebound, rigidity, or guarding. Genitourinary: Exam performed with nurse Jan at bedside.  External exam unremarkable, speculum exam reveals a scant amount of red blood in the vault.  After swabbing this away, there is a very slow ooze of fresh blood through the cervical loss.  Bimanual exam suggestive of enlarged uterus although this is difficult to ascertain due to obesity. Musculoskeletal:   Normal range of motion in all extremities. No joint effusions.  No lower extremity tenderness.  No edema. Neurologic:   Normal speech and language.  Motor grossly intact. No acute focal neurologic deficits are appreciated.  Skin:    Skin is warm, dry and intact. No rash noted.  No petechiae, purpura, or bullae.  ____________________________________________    LABS (pertinent positives/negatives) (all labs ordered are listed, but only abnormal results are displayed) Labs Reviewed  CBC - Abnormal; Notable for the following components:      Result Value   RBC 3.73 (*)    Hemoglobin 7.1 (*)    HCT  26.4 (*)    MCV 70.8 (*)    MCH 19.0 (*)    MCHC 26.9 (*)    RDW 18.6 (*)    All other components within normal limits  IRON AND TIBC - Abnormal; Notable for the following components:   Iron 15 (*)    TIBC 519 (*)    Saturation Ratios 3 (*)    All other components within normal limits  FERRITIN - Abnormal; Notable for the following components:   Ferritin 2 (*)    All other components within normal limits  COMPREHENSIVE METABOLIC PANEL  HCG, QUANTITATIVE, PREGNANCY  TYPE AND SCREEN   ____________________________________________   EKG  Interpreted by me Sinus rhythm rate of 71, normal axis intervals QRS  ST segments and T waves.  ____________________________________________    RADIOLOGY  US Pelvis Transvanginal Non-ob (tv Only)  Result Date: 07/11/2018 CLINICAL DATA:  Pelvic pain with vaginal bleeding. Enlarged uterus on physical examination. LMP 07/10/2018. EXAM: TRANSABDOMINAL AND TRANSVAGINAL ULTRASOUND OF PELVIS TECHNIQUE: Both transabdominal and transvaginal ultrasound examinations of the pelvis were performed. Transabdominal technique was performed for global imaging of the pelvis including uterus, ovaries, adnexal regions, and pelvic cul-de-sac. It was necessary to proceed with endovaginal exam following the transabdominal exam to visualize the ovaries to better advantage. COMPARISON:  None FINDINGS: Uterus Measurements: 6.7 x 4.7 x 5.5 cm = volume: 89.7 mL. The myometrium is heterogeneous. There is at least 1 small fibroid in the left anterior uterine body, measuring 11 x 12 x 11 mm. Endometrium Thickness: 6 mm.  No focal abnormality visualized. Right ovary Measurements: 2.7 x 2.0 x 2.1 cm = volume: 5.9 mL. Normal appearance/no adnexal mass. Left ovary Measurements: 2.9 x 1.8 x 1.9 cm = volume: 5.1 mL. Normal appearance/no adnexal mass. Other findings No abnormal free fluid. IMPRESSION: 1. Mild myometrial heterogeneity with at least 1 small fibroid. Consider superimposed  adenomyosis. 2. No endometrial thickening or focal endometrial abnormality. 3. Both ovaries appear normal. Electronically Signed   By: Richardean Sale M.D.   On: 07/11/2018 15:58   US Pelvis Complete  Result Date: 07/11/2018 CLINICAL DATA:  Pelvic pain with vaginal bleeding. Enlarged uterus on physical examination. LMP 07/10/2018. EXAM: TRANSABDOMINAL AND TRANSVAGINAL ULTRASOUND OF PELVIS TECHNIQUE: Both transabdominal and transvaginal ultrasound examinations of the pelvis were performed. Transabdominal technique was performed for global imaging of the pelvis including uterus, ovaries, adnexal regions, and pelvic cul-de-sac. It was necessary to proceed with endovaginal exam following the transabdominal exam to visualize the ovaries to better advantage. COMPARISON:  None FINDINGS: Uterus Measurements: 6.7 x 4.7 x 5.5 cm = volume: 89.7 mL. The myometrium is heterogeneous. There is at least 1 small fibroid in the left anterior uterine body, measuring 11 x 12 x 11 mm. Endometrium Thickness: 6 mm.  No focal abnormality visualized. Right ovary Measurements: 2.7 x 2.0 x 2.1 cm = volume: 5.9 mL. Normal appearance/no adnexal mass. Left ovary Measurements: 2.9 x 1.8 x 1.9 cm = volume: 5.1 mL. Normal appearance/no adnexal mass. Other findings No abnormal free fluid. IMPRESSION: 1. Mild myometrial heterogeneity with at least 1 small fibroid. Consider superimposed adenomyosis. 2. No endometrial thickening or focal endometrial abnormality. 3. Both ovaries appear normal. Electronically Signed   By: Richardean Sale M.D.   On: 07/11/2018 15:58    ____________________________________________   PROCEDURES Procedures  ____________________________________________  DIFFERENTIAL DIAGNOSIS   Uterine fibroid, dysfunctional uterine bleeding, iron deficiency anemia  CLINICAL IMPRESSION / ASSESSMENT AND PLAN / ED COURSE  Medications ordered in the ED: Medications  oxyCODONE-acetaminophen (PERCOCET/ROXICET) 5-325 MG per  tablet 1 tablet (1 tablet Oral Given 07/11/18 1339)  ketorolac (TORADOL) 30 MG/ML injection 15 mg (15 mg Intravenous Given 07/11/18 1605)  ondansetron (ZOFRAN) injection 4 mg (4 mg Intravenous Given 07/11/18 1605)    Pertinent labs & imaging results that were available during my care of the patient were reviewed by me and considered in my medical decision making (see chart for details).    Patient is sent to the ED for evaluation of anemia.  Her hemoglobin was 7.2 yesterday, 7.1 today.  CBC shows microcytic.  Because of her lack of symptoms I suspect that this is chronic.  On exam the vaginal bleeding is not brisk and unlikely to be producing  a acute hemorrhagic anemia.  Iron panel added on which does show profound iron deficiency, and ultrasound of the pelvis is overall unremarkable.  I discussed with gynecology Dr. Ouida Sills who advises starting the patient on high-dose combination hormonal contraceptive to promote hemostasis and can follow-up with her in the clinic this week.  Vital signs are normal, no orthostatic symptoms, hemodynamically stable.  Start iron supplement for her chronic iron deficiency anemia.      ____________________________________________   FINAL CLINICAL IMPRESSION(S) / ED DIAGNOSES    Final diagnoses:  Pelvic pain  Abnormal uterine bleeding (AUB)  Uterine leiomyoma, unspecified location  Iron deficiency anemia, unspecified iron deficiency anemia type     ED Discharge Orders         Ordered    norethindrone-ethinyl estradiol 1/35 ([place] 1/35, 28,) tablet     07/11/18 1657    Iron-Vit C-Vit B12-Folic Acid 048-889-1.694-5 MG TABS  Daily     07/11/18 1659          Portions of this note were generated with dragon dictation software. Dictation errors may occur despite best attempts at proofreading.   Carrie Mew, MD 07/11/18 [phone]

## 2018-08-13 ENCOUNTER — Other Ambulatory Visit: Payer: Self-pay

## 2018-08-13 ENCOUNTER — Encounter: Payer: Self-pay | Admitting: Internal Medicine

## 2018-08-13 ENCOUNTER — Inpatient Hospital Stay: Payer: Managed Care, Other (non HMO) | Attending: Internal Medicine | Admitting: Internal Medicine

## 2018-08-13 DIAGNOSIS — N920 Excessive and frequent menstruation with regular cycle: Secondary | ICD-10-CM

## 2018-08-13 DIAGNOSIS — Z9884 Bariatric surgery status: Secondary | ICD-10-CM

## 2018-08-13 DIAGNOSIS — D500 Iron deficiency anemia secondary to blood loss (chronic): Secondary | ICD-10-CM | POA: Diagnosis present

## 2018-08-13 DIAGNOSIS — Z808 Family history of malignant neoplasm of other organs or systems: Secondary | ICD-10-CM | POA: Diagnosis not present

## 2018-08-13 DIAGNOSIS — Z8052 Family history of malignant neoplasm of bladder: Secondary | ICD-10-CM | POA: Diagnosis not present

## 2018-08-13 DIAGNOSIS — K922 Gastrointestinal hemorrhage, unspecified: Secondary | ICD-10-CM | POA: Diagnosis not present

## 2018-08-13 DIAGNOSIS — Z803 Family history of malignant neoplasm of breast: Secondary | ICD-10-CM | POA: Diagnosis not present

## 2018-08-13 NOTE — Progress Notes (Signed)
[facility] CONSULT NOTE  Patient Care Team: Redmon, Barth Kirks, PA-C as PCP - General (Nurse Practitioner)  CHIEF COMPLAINTS/PURPOSE OF CONSULTATION:    HEMATOLOGY HISTORY  #Iron deficiency ANEMIA -menorrhagia /gastric bypass  # Gastric Bypass [sleeve/bypass- 2016]; menorrhagia [Dr.Schermerhorn ]  # Chronic Idiopathic Lymphedema ;  EGD-/reflux-2016; colonoscopy-none.   HISTORY OF PRESENTING ILLNESS:  Danielle Lin 43 y.o.  female has been referred to [place] for further evaluation/work-up for anemia.  Patient states since the last 4 months she is having heavy menstrual periods.  She has been evaluated by gynecology-currently on birth control pills.  Otherwise denies any blood in stools or black or stools.   Blood in stools: None Change in bowel habits- None Blood in urine: None Vaginal bleeding: None Difficulty swallowing: None Abnormal weight loss: sec to gastric bypass.  Iron supplementation: P.o. iron   Review of Systems  Constitutional: Positive for malaise/fatigue and weight loss. Negative for chills, diaphoresis and fever.  HENT: Negative for nosebleeds and sore throat.   Eyes: Negative for double vision.  Respiratory: Positive for shortness of breath. Negative for cough, hemoptysis, sputum production and wheezing.   Cardiovascular: Negative for chest pain, palpitations, orthopnea and leg swelling.  Gastrointestinal: Negative for abdominal pain, blood in stool, constipation, diarrhea, heartburn, melena, nausea and vomiting.  Genitourinary: Negative for dysuria, frequency and urgency.  Musculoskeletal: Negative for back pain and joint pain.  Skin: Negative.  Negative for itching and rash.  Neurological: Negative for dizziness, tingling, focal weakness, weakness and headaches.  Endo/Heme/Allergies: Does not bruise/bleed easily.  Psychiatric/Behavioral: Negative for depression. The patient is not nervous/anxious and does not have insomnia.     MEDICAL  HISTORY:  Past Medical History:  Diagnosis Date  . Diabetes mellitus type 2, uncomplicated ([place])   . GERD without esophagitis   . Heavy menses   . Hypertension   . IDA (iron deficiency anemia)   . Obstructive sleep apnea on CPAP     SURGICAL HISTORY: Past Surgical History:  Procedure Laterality Date  . BARIATRIC SURGERY    . CARPAL TUNNEL RELEASE Bilateral   . CHOLECYSTECTOMY    . GASTROPLASTY DUODENAL SWITCH      SOCIAL HISTORY: Social History   Socioeconomic History  . Marital status: Married    Spouse name: Not on file  . Number of children: Not on file  . Years of education: Not on file  . Highest education level: Not on file  Occupational History  . Not on file  Social Needs  . Financial resource strain: Not on file  . Food insecurity:    Worry: Not on file    Inability: Not on file  . Transportation needs:    Medical: Not on file    Non-medical: Not on file  Tobacco Use  . Smoking status: Never Smoker  . Smokeless tobacco: Never Used  Substance and Sexual Activity  . Alcohol use: No  . Drug use: No  . Sexual activity: Not on file  Lifestyle  . Physical activity:    Days per week: Not on file    Minutes per session: Not on file  . Stress: Not on file  Relationships  . Social connections:    Talks on phone: Not on file    Gets together: Not on file    Attends religious service: Not on file    Active member of club or organization: Not on file    Attends meetings of clubs or organizations: Not on file  Relationship status: Not on file  . Intimate partner violence:    Fear of current or ex partner: Not on file    Emotionally abused: Not on file    Physically abused: Not on file    Forced sexual activity: Not on file  Other Topics Concern  . Not on file  Social History Narrative   Lives in [place]; works at [employer]. No smoking; no alcohol.     FAMILY HISTORY: Family History  Problem Relation Age of Onset  . Bladder Cancer Mother   .  Uterine cancer Mother   . Breast cancer Maternal Grandmother     ALLERGIES:  is allergic to tetanus-diphtheria toxoids td and tetanus toxoid.  MEDICATIONS:  Current Outpatient Medications  Medication Sig Dispense Refill  . buPROPion (WELLBUTRIN XL) 300 MG 24 hr tablet Take 1 tablet by mouth daily.    . citalopram (CELEXA) 40 MG tablet Take 40 mg by mouth daily.    . Iron-Vit C-Vit B12-Folic Acid 546-568-1.275-1 MG TABS Take 1 tablet by mouth daily. 30 each 2  . levonorgestrel-ethinyl estradiol (SEASONALE,INTROVALE,JOLESSA) 0.15-0.03 MG tablet Take 1 tablet by mouth daily.    Marland Kitchen omeprazole (PRILOSEC) 20 MG capsule Take 20 mg by mouth daily.    . ondansetron (ZOFRAN) 4 MG tablet ondansetron HCl 4 mg tablet     No current facility-administered medications for this visit.       PHYSICAL EXAMINATION:   Vitals:   08/13/18 1355  BP: 121/70  Pulse: 78  Resp: 20  Temp: 99.1 F (37.3 C)   Filed Weights   08/13/18 1355  Weight: 213 lb (96.6 kg)    Physical Exam  Constitutional: She is oriented to person, place, and time and well-developed, well-nourished, and in no distress.  HENT:  Head: Normocephalic and atraumatic.  Mouth/Throat: Oropharynx is clear and moist. No oropharyngeal exudate.  Eyes: Pupils are equal, round, and reactive to light.  Neck: Normal range of motion. Neck supple.  Cardiovascular: Normal rate and regular rhythm.  Pulmonary/Chest: No respiratory distress. She has no wheezes.  Abdominal: Soft. Bowel sounds are normal. She exhibits no distension and no mass. There is no abdominal tenderness. There is no rebound and no guarding.  Musculoskeletal: Normal range of motion.        General: No tenderness or edema.  Neurological: She is alert and oriented to person, place, and time.  Skin: Skin is warm.  Psychiatric: Affect normal.    LABORATORY DATA:  I have reviewed the data as listed Lab Results  Component Value Date   WBC 5.1 07/11/2018   HGB 7.1 (L)  07/11/2018   HCT 26.4 (L) 07/11/2018   MCV 70.8 (L) 07/11/2018   PLT 371 07/11/2018   Recent Labs    07/11/18 1155  NA 138  K 4.1  CL 103  CO2 25  GLUCOSE 82  BUN 12  CREATININE 0.66  CALCIUM 9.1  GFRNONAA >60  GFRAA >60  PROT 8.0  ALBUMIN 4.1  AST 17  ALT 12  ALKPHOS 60  BILITOT 0.8     No results found.  Iron deficiency anemia due to chronic blood loss # IDA-hemoglobin PCP 7/iron studies suggestive of iron deficiency.  Symptomatic recommend IV iron.  Continue p.o. iron once a day.  Also discussed at length that she would likely need maintenance IV infusions.  Discussed the potential acute infusion reactions with IV iron; which are quite rare.  Patient understands the risk; will proceed with infusions.  # Menorrhagia-on hormonal  manipulation.  Defer to gynecology for management.  #Gastric bypass surgery-recommend checking vitamin D levels.   # DISPOSITION: # Ferrahem ASAP; and then 2 more infusion every week [total of 3] # follow up in VIDEO Visit 6 weeks; Labs- [facility] [will decide on more IV iron]-Dr.B  # 45 minutes face-to-face with the patient discussing the above plan of care; more than 50% of time spent on prognosis/ natural history; counseling and coordination.     All questions were answered. The patient knows to call the clinic with any problems, questions or concerns.      Cammie Sickle, MD 08/13/2018 3:23 PM

## 2018-08-13 NOTE — Assessment & Plan Note (Addendum)
#   IDA-hemoglobin PCP 7/iron studies suggestive of iron deficiency.  Symptomatic recommend IV iron.  Continue p.o. iron once a day.  Also discussed at length that she would likely need maintenance IV infusions.  Discussed the potential acute infusion reactions with IV iron; which are quite rare.  Patient understands the risk; will proceed with infusions.  # Menorrhagia-on hormonal manipulation.  Defer to gynecology for management.  #Gastric bypass surgery-recommend checking vitamin D levels.   # DISPOSITION: # Ferrahem ASAP; and then 2 more infusion every week [total of 3] # follow up in VIDEO Visit 6 weeks; Labs- [facility] [will decide on more IV iron]-Dr.B  # 45 minutes face-to-face with the patient discussing the above plan of care; more than 50% of time spent on prognosis/ natural history; counseling and coordination.

## 2018-08-15 ENCOUNTER — Other Ambulatory Visit: Payer: Self-pay

## 2018-08-15 ENCOUNTER — Inpatient Hospital Stay: Payer: Managed Care, Other (non HMO)

## 2018-08-15 VITALS — BP 115/75 | HR 74 | Temp 97.5°F | Resp 19

## 2018-08-15 DIAGNOSIS — D500 Iron deficiency anemia secondary to blood loss (chronic): Secondary | ICD-10-CM

## 2018-08-15 MED ORDER — SODIUM CHLORIDE 0.9 % IV SOLN
510.0000 mg | Freq: Once | INTRAVENOUS | Status: AC
  Administered 2018-08-15: 510 mg via INTRAVENOUS
  Filled 2018-08-15: qty 17

## 2018-08-15 MED ORDER — SODIUM CHLORIDE 0.9 % IV SOLN
Freq: Once | INTRAVENOUS | Status: AC
  Administered 2018-08-15: 11:00:00 via INTRAVENOUS
  Filled 2018-08-15: qty 250

## 2018-08-21 ENCOUNTER — Other Ambulatory Visit: Payer: Self-pay

## 2018-08-22 ENCOUNTER — Inpatient Hospital Stay: Payer: Managed Care, Other (non HMO)

## 2018-08-22 ENCOUNTER — Other Ambulatory Visit: Payer: Self-pay

## 2018-08-22 VITALS — BP 123/80 | HR 75 | Temp 97.6°F | Resp 18

## 2018-08-22 DIAGNOSIS — D500 Iron deficiency anemia secondary to blood loss (chronic): Secondary | ICD-10-CM

## 2018-08-22 MED ORDER — SODIUM CHLORIDE 0.9 % IV SOLN
510.0000 mg | Freq: Once | INTRAVENOUS | Status: AC
  Administered 2018-08-22: 510 mg via INTRAVENOUS
  Filled 2018-08-22: qty 17

## 2018-08-22 MED ORDER — SODIUM CHLORIDE 0.9 % IV SOLN
Freq: Once | INTRAVENOUS | Status: AC
  Administered 2018-08-22: 11:00:00 via INTRAVENOUS
  Filled 2018-08-22: qty 250

## 2018-08-29 ENCOUNTER — Inpatient Hospital Stay: Payer: Managed Care, Other (non HMO) | Attending: Internal Medicine

## 2018-08-29 ENCOUNTER — Other Ambulatory Visit: Payer: Self-pay

## 2018-08-29 VITALS — BP 125/79 | HR 65 | Temp 96.7°F | Resp 20

## 2018-08-29 DIAGNOSIS — N920 Excessive and frequent menstruation with regular cycle: Secondary | ICD-10-CM | POA: Diagnosis not present

## 2018-08-29 DIAGNOSIS — D500 Iron deficiency anemia secondary to blood loss (chronic): Secondary | ICD-10-CM | POA: Diagnosis present

## 2018-08-29 MED ORDER — SODIUM CHLORIDE 0.9 % IV SOLN
510.0000 mg | Freq: Once | INTRAVENOUS | Status: AC
  Administered 2018-08-29: 510 mg via INTRAVENOUS
  Filled 2018-08-29: qty 17

## 2018-08-29 MED ORDER — SODIUM CHLORIDE 0.9 % IV SOLN
Freq: Once | INTRAVENOUS | Status: AC
  Administered 2018-08-29: 11:00:00 via INTRAVENOUS
  Filled 2018-08-29: qty 250

## 2018-09-17 ENCOUNTER — Encounter: Payer: Self-pay | Admitting: Internal Medicine

## 2018-09-23 ENCOUNTER — Telehealth: Payer: Self-pay | Admitting: Internal Medicine

## 2018-09-24 ENCOUNTER — Encounter: Payer: Self-pay | Admitting: Internal Medicine

## 2018-09-24 ENCOUNTER — Other Ambulatory Visit: Payer: Self-pay

## 2018-09-24 ENCOUNTER — Inpatient Hospital Stay (HOSPITAL_BASED_OUTPATIENT_CLINIC_OR_DEPARTMENT_OTHER): Payer: Managed Care, Other (non HMO) | Admitting: Internal Medicine

## 2018-09-24 DIAGNOSIS — Z79899 Other long term (current) drug therapy: Secondary | ICD-10-CM

## 2018-09-24 DIAGNOSIS — D500 Iron deficiency anemia secondary to blood loss (chronic): Secondary | ICD-10-CM

## 2018-09-24 DIAGNOSIS — Z9884 Bariatric surgery status: Secondary | ICD-10-CM

## 2018-09-24 DIAGNOSIS — N920 Excessive and frequent menstruation with regular cycle: Secondary | ICD-10-CM

## 2018-09-24 NOTE — Progress Notes (Signed)
I connected with Danielle Lin on 09/24/18 at  9:30 AM EDT by video enabled telemedicine visit and verified that I am speaking with the correct person using two identifiers.  I discussed the limitations, risks, security and privacy concerns of performing an evaluation and management service by telemedicine and the availability of in-person appointments. I also discussed with the patient that there may be a patient responsible charge related to this service. The patient expressed understanding and agreed to proceed.    Other persons participating in the visit and their role in the encounter: None Patient's location: Home Provider's location: Home   No history exists.     Chief Complaint: Iron deficiency anemia   History of present illness:Danielle Lin 43 y.o.  female with history of iron deficient anemia secondary to menorrhagia/malabsorption secondary to gastric bypass is here for follow-up.  Patient notes to have only slight improvement in energy levels post IV iron infusions.  Denies any infusion reactions.  Her menstrual cycles are improved.  Denies any tingling or numbness.  Observation/objective: Hemoglobin 11.5/   Assessment and plan: Iron deficiency anemia due to chronic blood loss # IDA-secondary to menorrhagia/malabsorption gastric bypass-hemoglobin improved to 11.5 [[facility] normal] status post 3 IV Feraheme infusions.  Symptomatically slightly improved.    #Discussed that she will likely need maintenance IV iron.  Will decide next visit.  #Continued fatigue-history of heavy menstrual cycles/recommend adding B12 sublingual.  Will check B12 at next visit.  #Menorrhagia-currently improved followed by gynecology.  #Gastric bypass surgery-check B12 at next visit; vitamin D levels normal.  # DISPOSITION: # needs [facility] [cbc/iron studies/ferritin/b12]-[ will come tomorrow to pick it up;] to be done 1 week prior to next visit in 3 months.  # follow up in 3 months- MD-dox;-  Dr.B     Follow-up instructions:  I discussed the assessment and treatment plan with the patient.  The patient was provided an opportunity to ask questions and all were answered.  The patient agreed with the plan and demonstrated understanding of instructions.  The patient was advised to call back or seek an in person evaluation if the symptoms worsen or if the condition fails to improve as anticipated.  Dr. Charlaine Dalton [facility] at [facility] 09/24/2018 10:40 AM

## 2018-09-24 NOTE — Assessment & Plan Note (Signed)
#   IDA-secondary to menorrhagia/malabsorption gastric bypass-hemoglobin improved to 11.5 [[facility] normal] status post 3 IV Feraheme infusions.  Symptomatically slightly improved.    #Discussed that she will likely need maintenance IV iron.  Will decide next visit.  #Continued fatigue-history of heavy menstrual cycles/recommend adding B12 sublingual.  Will check B12 at next visit.  #Menorrhagia-currently improved followed by gynecology.  #Gastric bypass surgery-check B12 at next visit; vitamin D levels normal.  # DISPOSITION: # needs [facility] [cbc/iron studies/ferritin/b12]-[ will come tomorrow to pick it up;] to be done 1 week prior to next visit in 3 months.  # follow up in 3 months- MD-dox;- Dr.B

## 2018-12-24 ENCOUNTER — Encounter: Payer: Self-pay | Admitting: *Deleted

## 2018-12-24 ENCOUNTER — Inpatient Hospital Stay: Payer: Managed Care, Other (non HMO) | Admitting: Internal Medicine

## 2018-12-24 ENCOUNTER — Telehealth: Payer: Self-pay | Admitting: *Deleted

## 2018-12-24 NOTE — Telephone Encounter (Signed)
Placed call to reach patient for her virtual visit today with Dr. Rogue Bussing. Left vm requesting call back.

## 2018-12-24 NOTE — Progress Notes (Unsigned)
I connected with Blenda Bridegroom on 12/24/18 at  2:15 PM EDT by {Blank single:19197::"video enabled telemedicine visit","telephone visit"} and verified that I am speaking with the correct person using two identifiers.  I discussed the limitations, risks, security and privacy concerns of performing an evaluation and management service by telemedicine and the availability of in-person appointments. I also discussed with the patient that there may be a patient responsible charge related to this service. The patient expressed understanding and agreed to proceed.    Other persons participating in the visit and their role in the encounter: ***  Patient's location: ***  Provider's location: ***   Oncology History   No history exists.     Chief Complaint: ***    History of present illness:Danielle Lin 43 y.o.  female with history of   Observation/objective:  Assessment and plan: No problem-specific Assessment & Plan notes found for this encounter.    Follow-up instructions:  I discussed the assessment and treatment plan with the patient.  The patient was provided an opportunity to ask questions and all were answered.  The patient agreed with the plan and demonstrated understanding of instructions.  The patient was advised to call back or seek an in person evaluation if the symptoms worsen or if the condition fails to improve as anticipated.  I provided *** minutes of {Blank single:19197::"face-to-face video visit time","non face-to-face telephone visit time"} during this encounter, and > 50% was spent counseling as documented under my assessment & plan.   Dr. Charlaine Dalton [facility] at [facility] 12/24/2018 10:39 AM

## 2018-12-26 ENCOUNTER — Other Ambulatory Visit: Payer: Self-pay

## 2018-12-26 ENCOUNTER — Inpatient Hospital Stay: Payer: Managed Care, Other (non HMO) | Attending: Internal Medicine | Admitting: Internal Medicine

## 2018-12-26 ENCOUNTER — Encounter: Payer: Self-pay | Admitting: Internal Medicine

## 2018-12-26 DIAGNOSIS — D500 Iron deficiency anemia secondary to blood loss (chronic): Secondary | ICD-10-CM

## 2018-12-26 NOTE — Progress Notes (Signed)
I connected with Cherlynn Velie on 12/26/18 at  1:15 PM EDT by video enabled telemedicine visit and verified that I am speaking with the correct person using two identifiers.  I discussed the limitations, risks, security and privacy concerns of performing an evaluation and management service by telemedicine and the availability of in-person appointments. I also discussed with the patient that there may be a patient responsible charge related to this service. The patient expressed understanding and agreed to proceed.    Other persons participating in the visit and their role in the encounter: RN/medical reconciliation Patient's location: Home Provider's location: Office  Oncology History   No history exists.     Chief Complaint: Iron deficient anemia   History of present illness:Danielle Lin 43 y.o.  female with history of iron deficient anemia likely secondary to malabsorption gastric bypass is here for follow-up.  Patient noted to have improvement of her energy levels post Feraheme infusion.  However she seems to note she is more fatigued at this time.  Menorrhagia is improved.  There is no blood in stools or black or stools.  Patient states that she takes B12 sublingual intermittently.  Observation/objective:  Assessment and plan: Iron deficiency anemia due to chronic blood loss # IDA-secondary to menorrhagia/malabsorption gastric bypass-hemoglobin improved to 11.5 [[facility] normal] status post 3 IV Feraheme infusions.   #Noted to have improvement of her energy levels; however feels tired again.  States that she did not have lab work done/did not have the requisition.  Reminded the patient to call us few days after blood work-regarding the plan.  #Menorrhagia-followed by gynecology.  Improved.  #Gastric bypass surgery-check B12/vitamin D.  If low B12 would recommend injections.  # DISPOSITION: # needs [facility] [cbc/iron studies/ferritin/b12/ Vit D]-[ mail to pt;]  #Follow-up  to be decided based on labs- Dr.B  Follow-up instructions:  I discussed the assessment and treatment plan with the patient.  The patient was provided an opportunity to ask questions and all were answered.  The patient agreed with the plan and demonstrated understanding of instructions.  The patient was advised to call back or seek an in person evaluation if the symptoms worsen or if the condition fails to improve as anticipated.   Dr. Charlaine Dalton [place] at [facility] 12/26/2018 1:59 PM

## 2018-12-26 NOTE — Assessment & Plan Note (Addendum)
#   IDA-secondary to menorrhagia/malabsorption gastric bypass-hemoglobin improved to 11.5 [[facility] normal] status post 3 IV Feraheme infusions.   #Noted to have improvement of her energy levels; however feels tired again.  States that she did not have lab work done/did not have the requisition.  Reminded the patient to call us few days after blood work-regarding the plan.  #Menorrhagia-followed by gynecology.  Improved.  #Gastric bypass surgery-check B12/vitamin D.  If low B12 would recommend injections.  # DISPOSITION: # needs [facility] [cbc/iron studies/ferritin/b12/ Vit D]-[ mail to pt;]  #Follow-up to be decided based on labs- Dr.B

## 2019-04-13 ENCOUNTER — Encounter: Payer: Self-pay | Admitting: Internal Medicine

## 2019-04-15 ENCOUNTER — Other Ambulatory Visit: Payer: Self-pay | Admitting: Physician Assistant

## 2019-04-15 DIAGNOSIS — Z1231 Encounter for screening mammogram for malignant neoplasm of breast: Secondary | ICD-10-CM

## 2019-04-22 ENCOUNTER — Ambulatory Visit
Admission: RE | Admit: 2019-04-22 | Discharge: 2019-04-22 | Disposition: A | Discharge status: Home / Self Care | Payer: Managed Care, Other (non HMO) | Source: Ambulatory Visit | Attending: Physician Assistant | Admitting: Physician Assistant

## 2019-04-22 ENCOUNTER — Other Ambulatory Visit: Payer: Self-pay

## 2019-04-22 DIAGNOSIS — Z1231 Encounter for screening mammogram for malignant neoplasm of breast: Secondary | ICD-10-CM

## 2019-06-03 ENCOUNTER — Ambulatory Visit: Payer: Managed Care, Other (non HMO)

## 2019-11-04 ENCOUNTER — Other Ambulatory Visit: Payer: Self-pay | Admitting: Dentistry

## 2019-11-04 DIAGNOSIS — M2651 Abnormal jaw closure: Secondary | ICD-10-CM

## 2019-11-04 DIAGNOSIS — M2619 Other specified anomalies of jaw-cranial base relationship: Secondary | ICD-10-CM

## 2019-11-17 ENCOUNTER — Ambulatory Visit
Admission: RE | Admit: 2019-11-17 | Discharge: 2019-11-17 | Disposition: A | Discharge status: Home / Self Care | Payer: Managed Care, Other (non HMO) | Source: Ambulatory Visit | Attending: Dentistry | Admitting: Dentistry

## 2019-11-17 ENCOUNTER — Other Ambulatory Visit: Payer: Self-pay

## 2019-11-17 DIAGNOSIS — M2619 Other specified anomalies of jaw-cranial base relationship: Secondary | ICD-10-CM

## 2019-11-17 DIAGNOSIS — M2651 Abnormal jaw closure: Secondary | ICD-10-CM

## 2019-11-17 MED ORDER — IOPAMIDOL (ISOVUE-300) INJECTION 61%
75.0000 mL | Freq: Once | INTRAVENOUS | Status: AC | PRN
  Administered 2019-11-17: 75 mL via INTRAVENOUS

## 2020-04-15 ENCOUNTER — Other Ambulatory Visit: Payer: Self-pay | Admitting: Physician Assistant

## 2020-04-15 DIAGNOSIS — Z1231 Encounter for screening mammogram for malignant neoplasm of breast: Secondary | ICD-10-CM

## 2020-05-26 ENCOUNTER — Ambulatory Visit
Admission: RE | Admit: 2020-05-26 | Discharge: 2020-05-26 | Disposition: A | Discharge status: Home / Self Care | Payer: Managed Care, Other (non HMO) | Source: Ambulatory Visit | Attending: Physician Assistant | Admitting: Physician Assistant

## 2020-05-26 ENCOUNTER — Other Ambulatory Visit: Payer: Self-pay

## 2020-05-26 DIAGNOSIS — Z1231 Encounter for screening mammogram for malignant neoplasm of breast: Secondary | ICD-10-CM

## 2021-06-27 ENCOUNTER — Encounter (INDEPENDENT_AMBULATORY_CARE_PROVIDER_SITE_OTHER): Payer: Managed Care, Other (non HMO) | Admitting: Vascular Surgery

## 2021-06-30 ENCOUNTER — Encounter (INDEPENDENT_AMBULATORY_CARE_PROVIDER_SITE_OTHER): Payer: Self-pay | Admitting: Nurse Practitioner

## 2021-06-30 ENCOUNTER — Other Ambulatory Visit: Payer: Self-pay

## 2021-06-30 ENCOUNTER — Ambulatory Visit (INDEPENDENT_AMBULATORY_CARE_PROVIDER_SITE_OTHER): Payer: Managed Care, Other (non HMO) | Admitting: Nurse Practitioner

## 2021-06-30 VITALS — BP 122/80 | HR 73 | Resp 16 | Ht 59.0 in | Wt 272.6 lb

## 2021-06-30 DIAGNOSIS — I890 Lymphedema, not elsewhere classified: Secondary | ICD-10-CM

## 2021-07-01 ENCOUNTER — Encounter (INDEPENDENT_AMBULATORY_CARE_PROVIDER_SITE_OTHER): Payer: Self-pay | Admitting: Nurse Practitioner

## 2021-07-01 NOTE — Progress Notes (Signed)
? ?Subjective:  ? ? Patient ID: Danielle Lin, female    DOB: [birth year], 46 y.o.   MRN: [id number] ?Chief Complaint  ?Patient presents with  ? New Patient (Initial Visit)  ?  Ref Melrose Nakayama lymphedema  ? ? ?Danielle Lin is a 46 year old female that presents today as a referral from Dr. Melrose Nakayama in regards to lymphedema.  The patient notes that this has been a condition she has had for years, since high school at least.  She has previously worn compression socks however she does not wear them currently on a regular basis.  She has also tried lymphatic massage in the past which was only slightly helpful.  She has had her lymphedema pump for approximately 2 years.  She denies any previous history of DVTs.  She also notes that her grandmother had issues with swelling and believes that she may have had lymphedema as well. ? ? ?Review of Systems  ?Cardiovascular:  Positive for leg swelling.  ?All other systems reviewed and are negative. ? ?   ?Objective:  ? Physical Exam ?Vitals reviewed.  ?HENT:  ?   Head: Normocephalic.  ?Cardiovascular:  ?   Rate and Rhythm: Normal rate.  ?   Pulses: Normal pulses.  ?Pulmonary:  ?   Effort: Pulmonary effort is normal.  ?Musculoskeletal:  ?   Right lower leg: 2+ Edema present.  ?   Left lower leg: 2+ Edema present.  ?Neurological:  ?   Mental Status: She is alert and oriented to person, place, and time.  ?Psychiatric:     ?   Mood and Affect: Mood normal.     ?   Behavior: Behavior normal.     ?   Thought Content: Thought content normal.     ?   Judgment: Judgment normal.  ? ? ?BP 122/80 (BP Location: Right Arm)   Pulse 73   Resp 16   Ht '4\' 11"'$  (1.499 m)   Wt 272 lb 9.6 oz (123.7 kg)   BMI 55.06 kg/m?  ? ?Past Medical History:  ?Diagnosis Date  ? Diabetes mellitus type 2, uncomplicated ([place])   ? GERD without esophagitis   ? Heavy menses   ? Hypertension   ? IDA (iron deficiency anemia)   ? Obstructive sleep apnea on CPAP   ? ? ?Social History  ? ?Socioeconomic History  ? Marital  status: Married  ?  Spouse name: Not on file  ? Number of children: Not on file  ? Years of education: Not on file  ? Highest education level: Not on file  ?Occupational History  ? Not on file  ?Tobacco Use  ? Smoking status: Never  ? Smokeless tobacco: Never  ?Vaping Use  ? Vaping Use: Never used  ?Substance and Sexual Activity  ? Alcohol use: No  ? Drug use: No  ? Sexual activity: Not on file  ?Other Topics Concern  ? Not on file  ?Social History Narrative  ? Lives in [place]; works at [employer]. No smoking; no alcohol.   ? ?Social Determinants of Health  ? ?Financial Resource Strain: Not on file  ?Food Insecurity: Not on file  ?Transportation Needs: Not on file  ?Physical Activity: Not on file  ?Stress: Not on file  ?Social Connections: Not on file  ?Intimate Partner Violence: Not on file  ? ? ?Past Surgical History:  ?Procedure Laterality Date  ? BARIATRIC SURGERY    ? CARPAL TUNNEL RELEASE Bilateral   ? CHOLECYSTECTOMY    ? GASTROPLASTY DUODENAL  SWITCH    ? ? ?Family History  ?Problem Relation Age of Onset  ? Bladder Cancer Mother   ? Uterine cancer Mother   ? Breast cancer Maternal Grandmother   ? ? ?Allergies  ?Allergen Reactions  ? Other   ? Tetanus-Diphtheria Toxoids Td Other (See Comments)  ?  Raised rash and hardness at injection site  ? Tetanus Toxoid Rash  ? ? ?CBC Latest Ref Rng & Units 07/11/2018 05/13/2015  ?WBC 4.0 - 10.5 K/uL 5.1 7.0  ?Hemoglobin 12.0 - 15.0 g/dL 7.1(L) 12.9  ?Hematocrit 36.0 - 46.0 % 26.4(L) 39.4  ?Platelets 150 - 400 K/uL 371 230  ? ? ? ? ?CMP  ?   ?Component Value Date/Time  ? NA 138 07/11/2018 1155  ? K 4.1 07/11/2018 1155  ? CL 103 07/11/2018 1155  ? CO2 25 07/11/2018 1155  ? GLUCOSE 82 07/11/2018 1155  ? BUN 12 07/11/2018 1155  ? CREATININE 0.66 07/11/2018 1155  ? CALCIUM 9.1 07/11/2018 1155  ? PROT 8.0 07/11/2018 1155  ? ALBUMIN 4.1 07/11/2018 1155  ? AST 17 07/11/2018 1155  ? ALT 12 07/11/2018 1155  ? ALKPHOS 60 07/11/2018 1155  ? BILITOT 0.8 07/11/2018 1155  ? GFRNONAA >60  07/11/2018 1155  ? GFRAA >60 07/11/2018 1155  ? ? ? ?No results found. ? ?   ?Assessment & Plan:  ? ?1. Lymphedema ?I discussed with the patient the chronic nature of lymphedema.  Lymphedema typically requires adherence with conservative therapy including the use of medical grade compression stockings or Farrow wraps, elevation, activity, lymphedema pump if they persist 1.  Currently patient does not wear compression socks on a regular basis or maintain regular use of her lymphedema pump.  It was suggested that she begin to utilize these more regularly to help with the lymphedema.  There is a possibility that there may be an underlying venous insufficiency component to the patient's edema we will have her return at her earliest convenience for venous reflux studies. ? ? ?Current Outpatient Medications on File Prior to Visit  ?Medication Sig Dispense Refill  ? albuterol (VENTOLIN HFA) 108 (90 Base) MCG/ACT inhaler 1 puff as needed    ? ARIPiprazole (ABILIFY) 10 MG tablet Take 10 mg by mouth daily.    ? B Complex Vitamins (VITAMIN B-COMPLEX) TABS Take by mouth.    ? buPROPion (WELLBUTRIN XL) 300 MG 24 hr tablet Take 1 tablet by mouth daily.    ? citalopram (CELEXA) 40 MG tablet Take 40 mg by mouth daily.    ? hydrOXYzine (ATARAX) 25 MG tablet Take 50 mg by mouth daily.    ? Iron-Vit C-Vit B12-Folic Acid 003-491-7.915-0 MG TABS Take 1 tablet by mouth daily. 30 each 2  ? levonorgestrel (LILETTA) 19.5 MCG/DAY IUD IUD by Intrauterine route.    ? Multiple Vitamin (MULTI-VITAMIN) tablet Take 1 tablet by mouth daily.    ? omeprazole (PRILOSEC) 20 MG capsule Take 20 mg by mouth daily.    ? ondansetron (ZOFRAN) 4 MG tablet ondansetron HCl 4 mg tablet    ? gabapentin (NEURONTIN) 300 MG capsule Take 1 capsule by mouth daily. (Patient not taking: Reported on 06/30/2021)    ? ?No current facility-administered medications on file prior to visit.  ? ? ?There are no Patient Instructions on file for this visit. ?No follow-ups on  file. ? ? ?Kris Hartmann, NP ? ? ?

## 2021-07-06 ENCOUNTER — Encounter: Payer: Self-pay | Admitting: Internal Medicine

## 2021-07-06 ENCOUNTER — Other Ambulatory Visit: Payer: Self-pay | Admitting: Physician Assistant

## 2021-07-06 DIAGNOSIS — Z1231 Encounter for screening mammogram for malignant neoplasm of breast: Secondary | ICD-10-CM

## 2021-07-11 ENCOUNTER — Other Ambulatory Visit: Payer: Self-pay

## 2021-07-11 ENCOUNTER — Ambulatory Visit
Admission: RE | Admit: 2021-07-11 | Discharge: 2021-07-11 | Disposition: A | Discharge status: Home / Self Care | Payer: PRIVATE HEALTH INSURANCE | Source: Ambulatory Visit | Attending: Physician Assistant | Admitting: Physician Assistant

## 2021-07-11 ENCOUNTER — Encounter: Payer: Self-pay | Admitting: Internal Medicine

## 2021-07-11 DIAGNOSIS — Z1231 Encounter for screening mammogram for malignant neoplasm of breast: Secondary | ICD-10-CM

## 2021-08-07 ENCOUNTER — Other Ambulatory Visit (INDEPENDENT_AMBULATORY_CARE_PROVIDER_SITE_OTHER): Payer: Self-pay | Admitting: Vascular Surgery

## 2021-08-07 DIAGNOSIS — R600 Localized edema: Secondary | ICD-10-CM

## 2021-08-08 ENCOUNTER — Ambulatory Visit (INDEPENDENT_AMBULATORY_CARE_PROVIDER_SITE_OTHER): Payer: Managed Care, Other (non HMO)

## 2021-08-08 ENCOUNTER — Ambulatory Visit (INDEPENDENT_AMBULATORY_CARE_PROVIDER_SITE_OTHER): Payer: Managed Care, Other (non HMO) | Admitting: Vascular Surgery

## 2021-08-08 DIAGNOSIS — R600 Localized edema: Secondary | ICD-10-CM | POA: Diagnosis not present

## 2021-10-16 ENCOUNTER — Ambulatory Visit: Payer: PRIVATE HEALTH INSURANCE | Attending: Dentistry

## 2021-10-16 DIAGNOSIS — M542 Cervicalgia: Secondary | ICD-10-CM | POA: Diagnosis present

## 2021-10-16 NOTE — Therapy (Signed)
[place] MAIN [facility] SERVICES [place], [place], [address] Phone: [phone]   Fax:  [phone]  Physical Therapy Evaluation  Patient Details  Name: Christianna Belmonte MRN: [id number] Date of Birth: Sep 19, 1975 Referring Provider (PT): Johnnette Litter MD   Encounter Date: 10/16/2021   PT End of Session - 10/16/21 1238     Visit Number 1    Number of Visits 16    Date for PT Re-Evaluation 12/11/21    Authorization Type 1/10 eval 10/16/21    PT Start Time 0720    PT Stop Time 0759    PT Time Calculation (min) 39 min    Activity Tolerance Patient tolerated treatment well;Patient limited by pain    Behavior During Therapy [facility] for tasks assessed/performed             Past Medical History:  Diagnosis Date   Diabetes mellitus type 2, uncomplicated ([place])    GERD without esophagitis    Heavy menses    Hypertension    IDA (iron deficiency anemia)    Obstructive sleep apnea on CPAP     Past Surgical History:  Procedure Laterality Date   BARIATRIC SURGERY     CARPAL TUNNEL RELEASE Bilateral    CHOLECYSTECTOMY     GASTROPLASTY DUODENAL SWITCH      There were no vitals filed for this visit.    Subjective Assessment - 10/16/21 0801     Subjective Patient presents to physical therapy for TMJ pain    Pertinent History Patient has a history of bite changes about 2 years ago. Had extensive PT with [employer] and Dr. Wynelle Beckmann cleared her several months ago for dental treatment. She was then seen for a temporary bridge to restore her bite, which required significant adjustment.  PMH includes cervicalgia, anxiety, numbness and tingling, OSA, depression, lymphedema. Patient is going through extensive dental procedures and is going back in a month.    How long can you sit comfortably? n/a    How long can you stand comfortably? n/a    How long can you walk comfortably? n/a    Patient Stated Goals reduce pain    Currently in Pain?  Yes    Pain Score 4     Pain Location Jaw    Pain Orientation Right    Pain Descriptors / Indicators Aching;Sharp    Pain Type Chronic pain    Pain Onset 1 to 4 weeks ago    Pain Frequency Constant    Aggravating Factors  chewing, thorughout the day    Pain Relieving Factors ice    Effect of Pain on Daily Activities limit eating and quality of life               [facility] PT Assessment - 10/16/21 0001       Assessment   Medical Diagnosis TMJ    Referring Provider (PT) Johnnette Litter MD    Onset Date/Surgical Date --   first onset : 5+ years ago, seocnd onset about a month ago   Hand Dominance Right    Next MD Visit --   in a month   Prior Therapy yes      Precautions   Precautions None      Restrictions   Weight Bearing Restrictions No      Balance Screen   Has the patient fallen in the past 6 months No    Has the patient had a decrease in activity level because  of a fear of falling?  Yes    Is the patient reluctant to leave their home because of a fear of falling?  No      Prior Function   Level of Independence Independent    Vocation Full time employment    Vocation Requirements [employer]      Observation/Other Assessments   Focus on Therapeutic Outcomes (FOTO)  3                SUBJECTIVE   Onset:  First time: many years (5+ years) was treated, second time (past month)  Referring Dx:TMJ MD/Dentist:Mary Makhlouf  Last Dental Examination and imgaing: Recent dental procedures or orthodontics: Yes Follow-up appointment with MD: Yes History of prior physical therapy for this issue: Yes  Pain location: whole R side is swollen and painful  Pain quality: pain quality: sharp Radiating pain: Yes  Numbness/Tingling: Yes Pain Severity: Present: /10, Best: /10, Worst: /10 Aggravating factors:  Easing factors: Clicking, catching, or crepitus during chewing: No 24 hour pain behavior: worse in the evenings but stays constant throughout the day  History of  grinding teeth: Yes Recent or remote jaw/face/neck trauma, injury, or pain: Yes Progression (improving, worsening, unchanged):  History of headaches/migraines: No Ear symptoms (tinnitus, fullness, pain): No Chest pain: No Headaches/migraines: No Recent stressful life events: No Dominant hand: right  Worst pain 10/10 Least pain: 3-4/10 Current pain: 4/10   OBJECTIVE  Mental Status Patient is oriented to person, place and time.  Recent memory is intact.  Remote memory is intact.  Attention span and concentration are intact.  Expressive speech is intact.  Patient's fund of knowledge is within normal limits for educational level.      MUSCULOSKELETAL: Tremor: None Bulk: increased muscle tension in R jaw region  Tone: Normal Facial Symmetry: R slight slight elevation at jaw position   Posture Slight rounded shoulders   Cervical Screen Centralization: No centralization or peripheralization of symptoms with repeated cervical protraction and retraction.  AROM: limited ROM but painless   Passive Accessory Intervertebral Motion (PAIVM), central and unilateral (bilaterally): Normal mobility without reproduction of symptoms   Cervical AROM R/L 45 Cervical Flexion 45 Cervical Extension 40/45 Cervical Lateral Flexion 55/60 Cervical Rotation  Cervical spine hypomobile to grade II UPA and CPA.   Dermatome/Myotome Screen N=normal  Ab=abnormal Level Dermatome R L Myotome R L Reflex R L  C2 Posterior Scalp N N Cervical Flexion/Extension C1-2 N N Jaw CN V    C3 Anterior Neck N N Cervical Sidebend C2-3 N N Biceps C5-6    C4 Top of Shoulder N N Shoulder Shrug C4 N N Brachiorad. C5-6    C5 Lateral Upper Arm N N Shoulder ABD C4-5 N N Triceps C7    C6 Lateral Arm/ Thumb N ab Arm Flex/ Wrist Ext C5-6 N N     C7 Middle Finger N N Arm Ext//Wrist Flex C6-7 N N     C8 4th & 5th Finger N N Flex/ Ext Carpi Ulnaris C8 N N     T1 Medial Arm N N Interossei T1 N N      Sensation Limited  L elbow due to previous medical history   Palpation Graded on 0-4 scale (0 = no pain, 1 = pain, 2 = pain with wincing/grimacing/flinching, 3 = pain with withdrawal, 4 = unwilling to allow palpation) Location LEFT  RIGHT           Temporomandibular Joint (posterior, superior, anterior) 0 1  Temporalis (anterior,  middle, posterior fibers) 0 1  Temporalis Tendon Insertion 0 1  Masseter (Zygoma, Body, Lateral surface of angle of mandible) 0 1  Medial ptyergoid 0 1  Frontal Sinus 0 0  Maxillary Sinus 0 0  SCM 0 1  Upper Trapezius 1 0  Subocciptials 1 1    Mandibular AROM Resting Dental Alignment/Occlusion Normal overbite of 53m Mandible Depression (40-571m: slight limitation  Mandible Protrusion (3-83m55m slight limitation  Mandible Lateral Excursion (10-73m30mR: L: slight limitation to R; clicking noted Audible joint sounds (crepitus or clicking with stethoscope with opening, lateral deviation, and bite): Positive Reciprocal Click (palpation, click with both opening/closing): Positive Deviation of Mouth During Opening: Positive Deflection of Mouth at End Range: Negative  Strength R/L Functional Mandible Depression (Jaw Opening): 3+ Functional Mandible Protrusion:3+ Functional Mandible Elevation (Jaw Closing)3+ Functional/Functional Mandible Lateral Deviation 3   Passive Accessory Joint Motion (PAIVM) Inferior (Caudal Glide): hypomobile Anterior Glide: hypomobile  Medial Glide: hypomobile Lateral Glide: hypomobile  Medial and Lateral Extra-oral Glide: hypomobile  Pt denies reproduction of TMJ pain with CPA C2-T7 and UPA bilaterally C1-T7.  Special Tests  Manual Joint Compression: Positive Manual Joint Distraction: Not examined      ASSESSMENT Clinical Impression: Pt is a pleasant 46 y85r-old female referred for TMJ pain. PT examination reveals deficits strength and muscle tissue length with significant muscle tension on R mandibular region. Patient's pain is radiating  throughout skull and into bilateral arm. Significant crepitus present.   Pt presents with deficits in strength, mobility, range of motion, and pain. Pt will benefit from skilled PT services to address deficits and return to pain-free function at home and work.     Trigger Point Dry Needling (TDN), unbilled Education performed with patient regarding potential benefit of TDN. Reviewed precautions and risks with patient. Reviewed special precautions/risks over lung fields which include pneumothorax. Reviewed signs and symptoms of pneumothorax and advised pt to go to ER immediately if these symptoms develop advise them of dry needling treatment. Extensive time spent with pt to ensure full understanding of TDN risks. Pt provided verbal consent to treatment. TDN performed to  with 0.15 x 40 single needle placements with local twitch response (LTR). Pistoning technique utilized. Improved pain-free motion following intervention. Masseter targeted and pterygoid musculature             Objective measurements completed on examination: See above findings.            [facility]       Assessment   Medical Diagnosis TMJ    Referring Provider (PT) MakhJohnnette Litter   Onset Date/Surgical Date --   first onset : 5+ years ago, seocnd onset about a month ago   Hand Dominance Right    Next MD Visit --   in a month   Prior Therapy yes      Precautions   Precautions None      Restrictions   Weight Bearing Restrictions No      Balance Screen   Has the patient fallen in the past 6 months No    Has the patient had a decrease in activity level because of a fear of falling?  Yes    Is the patient reluctant to leave their home because of a fear of falling?  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    Baseline 6/19: HEP next session    Time 4    Period Weeks    Status New    Target Date 11/13/21      PT SHORT TERM GOAL #2   Title Patient will report a worst pain of 5/10 on VAS in R jaw to improve tolerance with ADLs and reduced symptoms with activities.    Baseline 6/19: 10/10    Time 4    Period Weeks    Status New    Target Date 11/13/21               PT Long Term Goals - 10/16/21 1245       PT LONG TERM GOAL #1   Title Patient will increase FOTO score to equal to or greater than  90   to demonstrate statistically significant improvement in mobility and quality of life.    Baseline 6/19: 85%    Time 8    Period Weeks    Status New    Target Date 12/11/21      PT LONG TERM GOAL #2   Title Patient will report a worst pain of 3/10 on VAS in R jaw to improve tolerance with ADLs and reduced symptoms with activities.    Baseline 6/19: 10/10    Time 8    Period Weeks    Status New    Target Date 12/11/21      PT LONG TERM GOAL #3   Title Pt will demonstrate decrease in NDI by at least 19% in order to demonstrate clinically significant reduction in disability related to neck injury/pain    Baseline 6/19: 26%    Time 8    Period Weeks    Status New    Target Date 12/11/21      PT LONG TERM GOAL #5   Title Patient will improve cervical ROM to within 5 degrees of normal for return to PLOF    Baseline 6/19: see note    Time 8    Period Weeks    Status New     Target Date 12/11/21                    Plan - 10/16/21 1240     Clinical Impression Statement Pt is a pleasant 46 year-old female referred for TMJ pain. PT examination reveals deficits strength and muscle tissue length with significant muscle tension on R mandibular region. Patient's pain is radiating throughout skull and into bilateral arm. Significant crepitus present.   Pt presents with deficits in strength, mobility, range of motion, and pain. Pt will benefit from skilled PT services to address deficits and return to pain-free function at home and work.    Personal Factors and Comorbidities Comorbidity 3+;Fitness;Past/Current Experience;Profession;Time since onset of injury/illness/exacerbation  Comorbidities cervicalgia, anxiety, numbness and tingling, OSA, depression, lymphedema.    Examination-Activity Limitations Self Feeding    Examination-Participation Restrictions Occupation;Other   eating   Stability/Clinical Decision Making Evolving/Moderate complexity    Clinical Decision Making Moderate    Rehab Potential Fair    PT Frequency 2x / week    PT Duration 8 weeks    PT Treatment/Interventions ADLs/Self Care Home Management;Cryotherapy;Moist Heat;Iontophoresis '4mg'$ /ml Dexamethasone;Electrical Stimulation;Traction;Ultrasound;Therapeutic activities;Functional mobility training;Therapeutic exercise;Balance training;Neuromuscular re-education;Patient/family education;Manual techniques;Manual lymph drainage;Taping;Splinting;Energy conservation;Dry needling;Passive range of motion;Vasopneumatic Device;Vestibular;Visual/perceptual remediation/compensation;Canalith Repostioning    PT Next Visit Plan TDN, mobilization, give HEP    Consulted and Agree with Plan of Care Patient             Patient will benefit from skilled therapeutic intervention in order to improve the following deficits and impairments:  Decreased range of motion, Decreased mobility, Decreased strength,  Hypomobility, Increased edema, Impaired flexibility, Improper body mechanics, Impaired sensation, Postural dysfunction, Pain  Visit Diagnosis: Cervicalgia     Problem List Patient Active Problem List   Diagnosis Date Noted   Iron deficiency anemia due to chronic blood loss 08/13/2018    Janna Arch, PT, DPT  10/16/2021, 12:50 PM  [place] MAIN [facility] SERVICES [address] [place], [place], [address] Phone: [phone]   Fax:  [phone]  Name: Jaretzi Droz MRN: [id number] Date of Birth: 1975/05/14

## 2021-10-19 ENCOUNTER — Ambulatory Visit: Payer: PRIVATE HEALTH INSURANCE

## 2021-10-19 DIAGNOSIS — M542 Cervicalgia: Secondary | ICD-10-CM

## 2021-10-19 NOTE — Therapy (Signed)
OUTPATIENT PHYSICAL THERAPY TREATMENT NOTE   Patient Name: Danielle Lin MRN: [id number] DOB:Dec 03, 1975, 46 y.o., female Today's Date: 10/19/2021  PCP: Lennie Odor, PA  REFERRING PROVIDER: Johnnette Litter MD    PT End of Session - 10/19/21 1515     Visit Number 2    Number of Visits 16    Date for PT Re-Evaluation 12/11/21    Authorization Type 2/10 eval 10/16/21    PT Start Time 1515    PT Stop Time 1600    PT Time Calculation (min) 45 min    Activity Tolerance Patient tolerated treatment well;Patient limited by pain    Behavior During Therapy WFL for tasks assessed/performed             Past Medical History:  Diagnosis Date   Diabetes mellitus type 2, uncomplicated ([place])    GERD without esophagitis    Heavy menses    Hypertension    IDA (iron deficiency anemia)    Obstructive sleep apnea on CPAP    Past Surgical History:  Procedure Laterality Date   BARIATRIC SURGERY     CARPAL TUNNEL RELEASE Bilateral    CHOLECYSTECTOMY     GASTROPLASTY DUODENAL SWITCH     Patient Active Problem List   Diagnosis Date Noted   Iron deficiency anemia due to chronic blood loss 08/13/2018    REFERRING DIAG: TMJ   THERAPY DIAG:  Cervicalgia  Rationale for Evaluation and Treatment Rehabilitation  PERTINENT HISTORY: Patient has a history of bite changes about 2 years ago. Had extensive PT with [employer] and Dr. Wynelle Beckmann cleared her several months ago for dental treatment. She was then seen for a temporary bridge to restore her bite, which required significant adjustment.  PMH includes cervicalgia, anxiety, numbness and tingling, OSA, depression, lymphedema. Patient is going through extensive dental procedures and is going back in a month  PRECAUTIONS: n/a  SUBJECTIVE: Patient reports she had 2-3 days of relief after last session   PAIN:  Are you having pain? Yes: NPRS scale: 3/10 Pain location: R jaw Pain description: aching Aggravating factors: n/a Relieving  factors: n/a     TODAY'S TREATMENT:   10/19/21:  Manual: Grade II mobilizations to jaw inferior glide x3 minutes L and R Grade II mobilization to cervical spine in supine position x 6 minutes Side bend with overpressure at occiput and glenohumeral joint 30 seconds x 2 trials Rotation with overpressure at occiput and GH joint 30 second seconds x2 trials Suboccipital release 30 seconds x 2 trials   Trigger Point Dry Needling (TDN), unbilled Education performed with patient regarding potential benefit of TDN. Reviewed precautions and risks with patient. Reviewed special precautions/risks over lung fields which include pneumothorax. Reviewed signs and symptoms of pneumothorax and advised pt to go to ER immediately if these symptoms develop advise them of dry needling treatment. Extensive time spent with pt to ensure full understanding of TDN risks. Pt provided verbal consent to treatment. TDN performed to  with 0.25 x 40 single needle placements with local twitch response (LTR). Pistoning technique utilized. Improved pain-free motion following intervention. Muscles targeted: massater, lateral pterygoid, temporalis. X 15 minutes     PATIENT EDUCATION: Education details: TDN, pain reduction  Person educated: Patient Education method: Explanation, Demonstration, Tactile cues, and Verbal cues Education comprehension: verbalized understanding, returned demonstration, verbal cues required, and tactile cues required   HOME EXERCISE PROGRAM: Access Code: [email or web address] URL: [email or web address] Date: 10/19/2021 Prepared by: Janna Arch  Exercises - External Jaw Release  -  1 x daily - 7 x weekly - 2 sets - 10 reps - 5 hold - Jaw Protraction  - 1 x daily - 7 x weekly - 2 sets - 10 reps - 5 hold - Side-to-Side Jaw Movement  - 1 x daily - 7 x weekly - 2 sets - 10 reps - 5 hold   PT Short Term Goals       PT SHORT TERM GOAL #1   Title Patient will be independent in home exercise  program to improve strength/mobility for better functional independence with ADLs.    Baseline 6/19: HEP next session    Time 4    Period Weeks    Status New    Target Date 11/13/21      PT SHORT TERM GOAL #2   Title Patient will report a worst pain of 5/10 on VAS in R jaw to improve tolerance with ADLs and reduced symptoms with activities.    Baseline 6/19: 10/10    Time 4    Period Weeks    Status New    Target Date 11/13/21              PT Long Term Goals       PT LONG TERM GOAL #1   Title Patient will increase FOTO score to equal to or greater than  90   to demonstrate statistically significant improvement in mobility and quality of life.    Baseline 6/19: 85%    Time 8    Period Weeks    Status New    Target Date 12/11/21      PT LONG TERM GOAL #2   Title Patient will report a worst pain of 3/10 on VAS in R jaw to improve tolerance with ADLs and reduced symptoms with activities.    Baseline 6/19: 10/10    Time 8    Period Weeks    Status New    Target Date 12/11/21      PT LONG TERM GOAL #3   Title Pt will demonstrate decrease in NDI by at least 19% in order to demonstrate clinically significant reduction in disability related to neck injury/pain    Baseline 6/19: 26%    Time 8    Period Weeks    Status New    Target Date 12/11/21      PT LONG TERM GOAL #5   Title Patient will improve cervical ROM to within 5 degrees of normal for return to PLOF    Baseline 6/19: see note    Time 8    Period Weeks    Status New    Target Date 12/11/21              Plan    Clinical Impression Statement Patient tolerates progressive TDN to bilateral masseters and lateral ptyerygoid musculature with multiple trigger points released. She is educated on and performs HEP demonstrating understanding.  Patient does have cervical muscle tension in addition to hypomobility of cervical spine that is reduced with manual therapy.Pt will benefit from skilled PT services to  address deficits and return to pain-free function at home and work.    Personal Factors and Comorbidities Comorbidity 3+;Fitness;Past/Current Experience;Profession;Time since onset of injury/illness/exacerbation    Comorbidities cervicalgia, anxiety, numbness and tingling, OSA, depression, lymphedema.    Examination-Activity Limitations Self Feeding    Examination-Participation Restrictions Occupation;Other   eating   Stability/Clinical Decision Making Evolving/Moderate complexity    Rehab Potential Fair    PT Frequency 2x /  week    PT Duration 8 weeks    PT Treatment/Interventions ADLs/Self Care Home Management;Cryotherapy;Moist Heat;Iontophoresis '4mg'$ /ml Dexamethasone;Electrical Stimulation;Traction;Ultrasound;Therapeutic activities;Functional mobility training;Therapeutic exercise;Balance training;Neuromuscular re-education;Patient/family education;Manual techniques;Manual lymph drainage;Taping;Splinting;Energy conservation;Dry needling;Passive range of motion;Vasopneumatic Device;Vestibular;Visual/perceptual remediation/compensation;Canalith Repostioning    PT Next Visit Plan TDN, mobilization, cervical alignment    Consulted and Agree with Plan of Care Patient               Janna Arch, PT, DPT  10/19/2021, 7:41 PM

## 2021-10-19 NOTE — Therapy (Incomplete)
OUTPATIENT PHYSICAL THERAPY TREATMENT NOTE   Patient Name: Danielle Lin MRN: [id number] DOB:30-Aug-1975, 46 y.o., female Today's Date: 10/19/2021  PCP: Lennie Odor, PA  REFERRING PROVIDER: Johnnette Litter MD      Past Medical History:  Diagnosis Date   Diabetes mellitus type 2, uncomplicated ([place])    GERD without esophagitis    Heavy menses    Hypertension    IDA (iron deficiency anemia)    Obstructive sleep apnea on CPAP    Past Surgical History:  Procedure Laterality Date   BARIATRIC SURGERY     CARPAL TUNNEL RELEASE Bilateral    CHOLECYSTECTOMY     GASTROPLASTY DUODENAL SWITCH     Patient Active Problem List   Diagnosis Date Noted   Iron deficiency anemia due to chronic blood loss 08/13/2018    REFERRING DIAG: TMJ   THERAPY DIAG:  No diagnosis found.  Rationale for Evaluation and Treatment Rehabilitation  PERTINENT HISTORY: Patient has a history of bite changes about 2 years ago. Had extensive PT with [facility] and Dr. Wynelle Beckmann cleared her several months ago for dental treatment. She was then seen for a temporary bridge to restore her bite, which required significant adjustment.  PMH includes cervicalgia, anxiety, numbness and tingling, OSA, depression, lymphedema. Patient is going through extensive dental procedures and is going back in a month  PRECAUTIONS: n/a  SUBJECTIVE: ***  PAIN:  Are you having pain? Yes: NPRS scale: 3/10 Pain location: R jaw Pain description: aching Aggravating factors: n/a Relieving factors: n/a     TODAY'S TREATMENT:   10/19/21:  Manual: Grade II mobilizations to jaw inferior glide x3 minutes L and R Grade II mobilization to cervical spine in supine position x 6 minutes Side bend with overpressure at occiput and glenohumeral joint 30 seconds x 2 trials Rotation with overpressure at occiput and GH joint 30 second seconds x2 trials Suboccipital release 30 seconds x 2 trials   Trigger Point Dry Needling (TDN),  unbilled Education performed with patient regarding potential benefit of TDN. Reviewed precautions and risks with patient. Reviewed special precautions/risks over lung fields which include pneumothorax. Reviewed signs and symptoms of pneumothorax and advised pt to go to ER immediately if these symptoms develop advise them of dry needling treatment. Extensive time spent with pt to ensure full understanding of TDN risks. Pt provided verbal consent to treatment. TDN performed to  with 0.25 x 40 single needle placements with local twitch response (LTR). Pistoning technique utilized. Improved pain-free motion following intervention. Muscles targeted: massater, lateral pterygoid, temporalis. X 15 minutes     PATIENT EDUCATION: Education details: TDN, pain reduction  Person educated: Patient Education method: Explanation, Demonstration, Tactile cues, and Verbal cues Education comprehension: verbalized understanding, returned demonstration, verbal cues required, and tactile cues required   HOME EXERCISE PROGRAM: Access Code: [email or web address] URL: [email or web address] Date: 10/19/2021 Prepared by: Janna Arch  Exercises - External Jaw Release  - 1 x daily - 7 x weekly - 2 sets - 10 reps - 5 hold - Jaw Protraction  - 1 x daily - 7 x weekly - 2 sets - 10 reps - 5 hold - Side-to-Side Jaw Movement  - 1 x daily - 7 x weekly - 2 sets - 10 reps - 5 hold   PT Short Term Goals       PT SHORT TERM GOAL #1   Title Patient will be independent in home exercise program to improve strength/mobility for better functional independence with ADLs.    Baseline 6/19:  HEP next session    Time 4    Period Weeks    Status New    Target Date 11/13/21      PT SHORT TERM GOAL #2   Title Patient will report a worst pain of 5/10 on VAS in R jaw to improve tolerance with ADLs and reduced symptoms with activities.    Baseline 6/19: 10/10    Time 4    Period Weeks    Status New    Target Date 11/13/21               PT Long Term Goals       PT LONG TERM GOAL #1   Title Patient will increase FOTO score to equal to or greater than  90   to demonstrate statistically significant improvement in mobility and quality of life.    Baseline 6/19: 85%    Time 8    Period Weeks    Status New    Target Date 12/11/21      PT LONG TERM GOAL #2   Title Patient will report a worst pain of 3/10 on VAS in R jaw to improve tolerance with ADLs and reduced symptoms with activities.    Baseline 6/19: 10/10    Time 8    Period Weeks    Status New    Target Date 12/11/21      PT LONG TERM GOAL #3   Title Pt will demonstrate decrease in NDI by at least 19% in order to demonstrate clinically significant reduction in disability related to neck injury/pain    Baseline 6/19: 26%    Time 8    Period Weeks    Status New    Target Date 12/11/21      PT LONG TERM GOAL #5   Title Patient will improve cervical ROM to within 5 degrees of normal for return to PLOF    Baseline 6/19: see note    Time 8    Period Weeks    Status New    Target Date 12/11/21              Plan    Clinical Impression Statement ***   Personal Factors and Comorbidities Comorbidity 3+;Fitness;Past/Current Experience;Profession;Time since onset of injury/illness/exacerbation    Comorbidities cervicalgia, anxiety, numbness and tingling, OSA, depression, lymphedema.    Examination-Activity Limitations Self Feeding    Examination-Participation Restrictions Occupation;Other   eating   Stability/Clinical Decision Making Evolving/Moderate complexity    Rehab Potential Fair    PT Frequency 2x / week    PT Duration 8 weeks    PT Treatment/Interventions ADLs/Self Care Home Management;Cryotherapy;Moist Heat;Iontophoresis '4mg'$ /ml Dexamethasone;Electrical Stimulation;Traction;Ultrasound;Therapeutic activities;Functional mobility training;Therapeutic exercise;Balance training;Neuromuscular re-education;Patient/family education;Manual  techniques;Manual lymph drainage;Taping;Splinting;Energy conservation;Dry needling;Passive range of motion;Vasopneumatic Device;Vestibular;Visual/perceptual remediation/compensation;Canalith Repostioning    PT Next Visit Plan TDN, mobilization, cervical alignment    Consulted and Agree with Plan of Care Patient               Janna Arch, PT, DPT  10/19/2021, 8:30 PM

## 2021-10-23 ENCOUNTER — Ambulatory Visit: Payer: PRIVATE HEALTH INSURANCE

## 2021-10-23 DIAGNOSIS — M542 Cervicalgia: Secondary | ICD-10-CM

## 2021-10-25 ENCOUNTER — Ambulatory Visit: Payer: PRIVATE HEALTH INSURANCE

## 2021-10-25 DIAGNOSIS — M542 Cervicalgia: Secondary | ICD-10-CM

## 2021-10-25 NOTE — Therapy (Signed)
OUTPATIENT PHYSICAL THERAPY TREATMENT NOTE   Patient Name: Danielle Lin MRN: [id number] DOB:1975/12/11, 46 y.o., female Today's Date: 10/25/2021  PCP: Lennie Odor, PA  REFERRING PROVIDER: Johnnette Litter MD    PT End of Session - 10/25/21 0722     Visit Number 4    Number of Visits 16    Date for PT Re-Evaluation 12/11/21    Authorization Type [employer]    Authorization Time Period 10/16/21-12/11/21    PT Start Time 0718    PT Stop Time 2202    PT Time Calculation (min) 39 min    Activity Tolerance Patient tolerated treatment well;No increased pain    Behavior During Therapy WFL for tasks assessed/performed              Past Medical History:  Diagnosis Date   Diabetes mellitus type 2, uncomplicated ([facility])    GERD without esophagitis    Heavy menses    Hypertension    IDA (iron deficiency anemia)    Obstructive sleep apnea on CPAP    Past Surgical History:  Procedure Laterality Date   BARIATRIC SURGERY     CARPAL TUNNEL RELEASE Bilateral    CHOLECYSTECTOMY     GASTROPLASTY DUODENAL SWITCH     Patient Active Problem List   Diagnosis Date Noted   Iron deficiency anemia due to chronic blood loss 08/13/2018    REFERRING DIAG: TMJ   THERAPY DIAG:  Cervicalgia  Rationale for Evaluation and Treatment Rehabilitation  PERTINENT HISTORY: Patient has a history of bite changes about 2 years ago. Had extensive PT @ [facility] with Dr. Wynelle Beckmann cleared her several months ago for dental treatment. She was then seen for a temporary bridge to restore her bite, which required significant adjustment.  PMH includes cervicalgia, anxiety, numbness and tingling, OSA, depression, lymphedema. Patient is going through extensive dental procedures and is going back in a month  PRECAUTIONS: n/a  SUBJECTIVE: Pt reports some tightness in her right temple area, some pain last night while sleeping. Pt reports good response to TPDN on monday 2 sessions ago. Pt reports HEP  activities are being performed with success.  PAIN:  Are you having pain? No  REASSESSMENT:  -Cervical Rotation A/ROM: Left 62, Right 64 degrees (pain free, unrestricted per patient)    -Extension: 45 degrees (pain free, unrestricted per patient)  -Jaw lateral glide 21m Rt, 647mLt; Left side: 1033most intervention -opening, closing of jaw- no frank signs of asymmetry or unilateral restriction  TODAY'S TREATMENT:   10/25/21:  Manual: Left mandibular glide A/ROM + overpressure (mobilization with movement) 10x *resolved hypomobility after intervention -trigger point release Rt masseter, Rt lateral pterygoid -ART to Rt temporalis with sustained full opening 10x3secH (generalized tightness noted without focal taut bands)   Trigger Point Dry Needling (TDN), unbilled 3 sticks to Rt masseter along the distal portion jaw line.     PATIENT EDUCATION: Education details: pain-free clicking is ok, resolution is not an expected result of PT interventions, but typically resolves in time as the joint changes over months to years.   Person educated: Patient Education method: Explanation, Demonstration, Tactile cues, and Verbal cues Education comprehension: verbalized understanding, returned demonstration, verbal cues required, and tactile cues required   HOME EXERCISE PROGRAM: Access Code: [email or web address] URL: [email or web address] Date: 10/19/2021 Prepared by: MarJanna Archxercises - External Jaw Release  - 1 x daily - 7 x weekly - 2 sets - 10 reps - 5 hold - Jaw Protraction  - 1  x daily - 7 x weekly - 2 sets - 10 reps - 5 hold - Side-to-Side Jaw Movement  - 1 x daily - 7 x weekly - 2 sets - 10 reps - 5 hold   PT Short Term Goals - 10/16/21 1244       PT SHORT TERM GOAL #1   Title Patient will be independent in home exercise program to improve strength/mobility for better functional independence with ADLs.    Baseline 6/19: HEP next session    Time 4    Period Weeks     Status New    Target Date 11/13/21      PT SHORT TERM GOAL #2   Title Patient will report a worst pain of 5/10 on VAS in R jaw to improve tolerance with ADLs and reduced symptoms with activities.    Baseline 6/19: 10/10    Time 4    Period Weeks    Status New    Target Date 11/13/21               PT Long Term Goals - 10/16/21 1245       PT LONG TERM GOAL #1   Title Patient will increase FOTO score to equal to or greater than  90   to demonstrate statistically significant improvement in mobility and quality of life.    Baseline 6/19: 85%    Time 8    Period Weeks    Status New    Target Date 12/11/21      PT LONG TERM GOAL #2   Title Patient will report a worst pain of 3/10 on VAS in R jaw to improve tolerance with ADLs and reduced symptoms with activities.    Baseline 6/19: 10/10    Time 8    Period Weeks    Status New    Target Date 12/11/21      PT LONG TERM GOAL #3   Title Pt will demonstrate decrease in NDI by at least 19% in order to demonstrate clinically significant reduction in disability related to neck injury/pain    Baseline 6/19: 26%    Time 8    Period Weeks    Status New    Target Date 12/11/21      PT LONG TERM GOAL #5   Title Patient will improve cervical ROM to within 5 degrees of normal for return to PLOF    Baseline 6/19: see note    Time 8    Period Weeks    Status New    Target Date 12/11/21                 Plan - 10/25/21 0759     Clinical Impression Statement Continued to address deficits of ROM and areas of pain/tightness. Retricted Left lateral glide improves to good symmetry and normal ROM. More needling to Rt masseter before what is moistly heavy manual therapy to Rt sided musculature. Cervical Rotation much improved since evaluation.    Personal Factors and Comorbidities Comorbidity 3+;Fitness;Past/Current Experience;Profession;Time since onset of injury/illness/exacerbation    Comorbidities cervicalgia, anxiety, numbness  and tingling, OSA, depression, lymphedema.    Examination-Activity Limitations Self Feeding    Examination-Participation Restrictions Occupation;Other    Stability/Clinical Decision Making Evolving/Moderate complexity    Clinical Decision Making Moderate    Rehab Potential Fair    PT Frequency 2x / week    PT Duration 8 weeks    PT Treatment/Interventions ADLs/Self Care Home Management;Cryotherapy;Moist Heat;Iontophoresis '4mg'$ /ml Dexamethasone;Electrical Stimulation;Traction;Ultrasound;Therapeutic activities;Functional  mobility training;Therapeutic exercise;Balance training;Neuromuscular re-education;Patient/family education;Manual techniques;Manual lymph drainage;Taping;Splinting;Energy conservation;Dry needling;Passive range of motion;Vasopneumatic Device;Vestibular;Visual/perceptual remediation/compensation;Canalith Repostioning    PT Next Visit Plan Continue working toward goals of care    PT Home Exercise Plan No changes this visit    Consulted and Agree with Plan of Care Patient                8:06 AM, 10/25/21 Etta Grandchild, PT, DPT Physical Therapist - [place] [phone]    10/25/2021, 8:05 AM

## 2021-10-26 NOTE — Therapy (Signed)
OUTPATIENT PHYSICAL THERAPY TREATMENT NOTE   Patient Name: Danielle Lin MRN: [id number] DOB:1975-08-13, 46 y.o., female Today's Date: 10/30/2021  PCP: Lennie Odor, PA  REFERRING PROVIDER: Johnnette Litter MD    PT End of Session - 10/30/21 0756     Visit Number 5    Number of Visits 16    Date for PT Re-Evaluation 12/11/21    Authorization Type [employer]    Authorization Time Period 10/16/21-12/11/21    PT Start Time 0800    PT Stop Time 0832    PT Time Calculation (min) 32 min    Activity Tolerance Patient tolerated treatment well;No increased pain    Behavior During Therapy WFL for tasks assessed/performed               Past Medical History:  Diagnosis Date   Diabetes mellitus type 2, uncomplicated ([facility])    GERD without esophagitis    Heavy menses    Hypertension    IDA (iron deficiency anemia)    Obstructive sleep apnea on CPAP    Past Surgical History:  Procedure Laterality Date   BARIATRIC SURGERY     CARPAL TUNNEL RELEASE Bilateral    CHOLECYSTECTOMY     GASTROPLASTY DUODENAL SWITCH     Patient Active Problem List   Diagnosis Date Noted   Iron deficiency anemia due to chronic blood loss 08/13/2018    REFERRING DIAG: TMJ   THERAPY DIAG:  Cervicalgia  Rationale for Evaluation and Treatment Rehabilitation  PERTINENT HISTORY: Patient has a history of bite changes about 2 years ago. Had extensive PT with [facility] and Dr. Wynelle Beckmann cleared her several months ago for dental treatment. She was then seen for a temporary bridge to restore her bite, which required significant adjustment.  PMH includes cervicalgia, anxiety, numbness and tingling, OSA, depression, lymphedema. Patient is going through extensive dental procedures and is going back in a month  PRECAUTIONS: n/a  SUBJECTIVE: Patient reports she had a regression since last session, has started having grinding of her teeth with chewing.   PAIN:  Are you having pain? Yes: NPRS scale: 6/10 Pain  location: R jaw Pain description: aching Aggravating factors: n/a Relieving factors: n/a     TODAY'S TREATMENT:   10/30/21:  Manual: Grade II mobilizations to jaw inferior glide x3 minutes L and R Grade II mobilization to cervical spine in supine position x 6 minutes Side bend with overpressure at occiput and glenohumeral joint 30 seconds x 2 trials Rotation with overpressure at occiput and GH joint 30 second seconds x2 trials Suboccipital release 30 seconds x 2 trials   Trigger Point Dry Needling (TDN), unbilled Education performed with patient regarding potential benefit of TDN. Reviewed precautions and risks with patient. Reviewed special precautions/risks over lung fields which include pneumothorax. Reviewed signs and symptoms of pneumothorax and advised pt to go to ER immediately if these symptoms develop advise them of dry needling treatment. Extensive time spent with pt to ensure full understanding of TDN risks. Pt provided verbal consent to treatment. TDN performed to  with 0.25 x 40 single needle placements with local twitch response (LTR). Pistoning technique utilized. Improved pain-free motion following intervention. Muscles targeted: massater, lateral pterygoid, temporalis. X 20 minutes     PATIENT EDUCATION: Education details: TDN, pain reduction  Person educated: Patient Education method: Explanation, Demonstration, Tactile cues, and Verbal cues Education comprehension: verbalized understanding, returned demonstration, verbal cues required, and tactile cues required   HOME EXERCISE PROGRAM: Access Code: [email or web address] URL: [email or web address] Date:  10/19/2021 Prepared by: Janna Arch  Exercises - External Jaw Release  - 1 x daily - 7 x weekly - 2 sets - 10 reps - 5 hold - Jaw Protraction  - 1 x daily - 7 x weekly - 2 sets - 10 reps - 5 hold - Side-to-Side Jaw Movement  - 1 x daily - 7 x weekly - 2 sets - 10 reps - 5 hold   PT Short Term Goals        PT SHORT TERM GOAL #1   Title Patient will be independent in home exercise program to improve strength/mobility for better functional independence with ADLs.    Baseline 6/19: HEP next session    Time 4    Period Weeks    Status New    Target Date 11/13/21      PT SHORT TERM GOAL #2   Title Patient will report a worst pain of 5/10 on VAS in R jaw to improve tolerance with ADLs and reduced symptoms with activities.    Baseline 6/19: 10/10    Time 4    Period Weeks    Status New    Target Date 11/13/21              PT Long Term Goals       PT LONG TERM GOAL #1   Title Patient will increase FOTO score to equal to or greater than  90   to demonstrate statistically significant improvement in mobility and quality of life.    Baseline 6/19: 85%    Time 8    Period Weeks    Status New    Target Date 12/11/21      PT LONG TERM GOAL #2   Title Patient will report a worst pain of 3/10 on VAS in R jaw to improve tolerance with ADLs and reduced symptoms with activities.    Baseline 6/19: 10/10    Time 8    Period Weeks    Status New    Target Date 12/11/21      PT LONG TERM GOAL #3   Title Pt will demonstrate decrease in NDI by at least 19% in order to demonstrate clinically significant reduction in disability related to neck injury/pain    Baseline 6/19: 26%    Time 8    Period Weeks    Status New    Target Date 12/11/21      PT LONG TERM GOAL #5   Title Patient will improve cervical ROM to within 5 degrees of normal for return to PLOF    Baseline 6/19: see note    Time 8    Period Weeks    Status New    Target Date 12/11/21              Plan    Clinical Impression Statement Patient presents with regression from last session. Focused TDN and pain reduction techniques utilized with patient reporting relief of "tightness " by end of session. Decreased grinding/crunching by end of session .Pt will benefit from skilled PT services to address deficits and return to  pain-free function at home and work.   Personal Factors and Comorbidities Comorbidity 3+;Fitness;Past/Current Experience;Profession;Time since onset of injury/illness/exacerbation    Comorbidities cervicalgia, anxiety, numbness and tingling, OSA, depression, lymphedema.    Examination-Activity Limitations Self Feeding    Examination-Participation Restrictions Occupation;Other   eating   Stability/Clinical Decision Making Evolving/Moderate complexity    Rehab Potential Fair    PT Frequency 2x /  week    PT Duration 8 weeks    PT Treatment/Interventions ADLs/Self Care Home Management;Cryotherapy;Moist Heat;Iontophoresis '4mg'$ /ml Dexamethasone;Electrical Stimulation;Traction;Ultrasound;Therapeutic activities;Functional mobility training;Therapeutic exercise;Balance training;Neuromuscular re-education;Patient/family education;Manual techniques;Manual lymph drainage;Taping;Splinting;Energy conservation;Dry needling;Passive range of motion;Vasopneumatic Device;Vestibular;Visual/perceptual remediation/compensation;Canalith Repostioning    PT Next Visit Plan TDN, mobilization, cervical alignment    Consulted and Agree with Plan of Care Patient               Janna Arch, PT, DPT  10/30/2021, 8:36 AM

## 2021-10-30 ENCOUNTER — Ambulatory Visit: Payer: Managed Care, Other (non HMO) | Attending: Dentistry

## 2021-10-30 DIAGNOSIS — M542 Cervicalgia: Secondary | ICD-10-CM | POA: Insufficient documentation

## 2021-11-08 ENCOUNTER — Encounter: Payer: Self-pay | Admitting: Internal Medicine

## 2021-11-09 NOTE — Therapy (Signed)
OUTPATIENT PHYSICAL THERAPY TREATMENT NOTE   Patient Name: Danielle Lin MRN: [id number] DOB:Jun 06, 1975, 46 y.o., female Today's Date: 11/13/2021  PCP: Lennie Odor, PA  REFERRING PROVIDER: Johnnette Litter MD    PT End of Session - 11/13/21 0756     Visit Number 6    Number of Visits 16    Date for PT Re-Evaluation 12/11/21    Authorization Type [employer]    Authorization Time Period 10/16/21-12/11/21    PT Start Time 0800    PT Stop Time 3220    PT Time Calculation (min) 44 min    Activity Tolerance Patient tolerated treatment well;No increased pain    Behavior During Therapy WFL for tasks assessed/performed                Past Medical History:  Diagnosis Date   Diabetes mellitus type 2, uncomplicated (HCC)    GERD without esophagitis    Heavy menses    Hypertension    IDA (iron deficiency anemia)    Obstructive sleep apnea on CPAP    Past Surgical History:  Procedure Laterality Date   BARIATRIC SURGERY     CARPAL TUNNEL RELEASE Bilateral    CHOLECYSTECTOMY     GASTROPLASTY DUODENAL SWITCH     Patient Active Problem List   Diagnosis Date Noted   Iron deficiency anemia due to chronic blood loss 08/13/2018    REFERRING DIAG: TMJ   THERAPY DIAG:  Cervicalgia  Rationale for Evaluation and Treatment Rehabilitation  PERTINENT HISTORY: Patient has a history of bite changes about 2 years ago. Had extensive PT with [facility] and Dr. Wynelle Beckmann cleared her several months ago for dental treatment. She was then seen for a temporary bridge to restore her bite, which required significant adjustment.  PMH includes cervicalgia, anxiety, numbness and tingling, OSA, depression, lymphedema. Patient is going through extensive dental procedures and is going back in a month  PRECAUTIONS: n/a  SUBJECTIVE: Patient reports her jaw is getting better. Her pain is reduced and not flaring as much  PAIN:  Are you having pain? Yes: NPRS scale: 3/10 Pain location: R jaw Pain  description: aching Aggravating factors: n/a Relieving factors: n/a     TODAY'S TREATMENT:    Manual: Grade II mobilizations to jaw inferior glide x3 minutes L and R Grade II mobilization to cervical spine in supine position x 6 minutes Side bend with overpressure at occiput and glenohumeral joint 30 seconds x 2 trials Rotation with overpressure at occiput and GH joint 30 second seconds x2 trials Suboccipital release 30 seconds x 2 trials   Trigger Point Dry Needling (TDN), unbilled Education performed with patient regarding potential benefit of TDN. Reviewed precautions and risks with patient. Reviewed special precautions/risks over lung fields which include pneumothorax. Reviewed signs and symptoms of pneumothorax and advised pt to go to ER immediately if these symptoms develop advise them of dry needling treatment. Extensive time spent with pt to ensure full understanding of TDN risks. Pt provided verbal consent to treatment. TDN performed to  with 0.25 x 40 single needle placements with local twitch response (LTR). Pistoning technique utilized. Improved pain-free motion following intervention. Muscles targeted: massater, lateral pterygoid, temporalis. X 20 minutes     PATIENT EDUCATION: Education details: TDN, pain reduction  Person educated: Patient Education method: Explanation, Demonstration, Tactile cues, and Verbal cues Education comprehension: verbalized understanding, returned demonstration, verbal cues required, and tactile cues required   HOME EXERCISE PROGRAM: Access Code: [email or web address] URL: [email or web address] Date: 10/19/2021 Prepared by:  Janna Arch  Exercises - External Jaw Release  - 1 x daily - 7 x weekly - 2 sets - 10 reps - 5 hold - Jaw Protraction  - 1 x daily - 7 x weekly - 2 sets - 10 reps - 5 hold - Side-to-Side Jaw Movement  - 1 x daily - 7 x weekly - 2 sets - 10 reps - 5 hold   PT Short Term Goals       PT SHORT TERM GOAL #1   Title  Patient will be independent in home exercise program to improve strength/mobility for better functional independence with ADLs.    Baseline 6/19: HEP next session    Time 4    Period Weeks    Status New    Target Date 11/13/21      PT SHORT TERM GOAL #2   Title Patient will report a worst pain of 5/10 on VAS in R jaw to improve tolerance with ADLs and reduced symptoms with activities.    Baseline 6/19: 10/10    Time 4    Period Weeks    Status New    Target Date 11/13/21              PT Long Term Goals       PT LONG TERM GOAL #1   Title Patient will increase FOTO score to equal to or greater than  90   to demonstrate statistically significant improvement in mobility and quality of life.    Baseline 6/19: 85%    Time 8    Period Weeks    Status New    Target Date 12/11/21      PT LONG TERM GOAL #2   Title Patient will report a worst pain of 3/10 on VAS in R jaw to improve tolerance with ADLs and reduced symptoms with activities.    Baseline 6/19: 10/10    Time 8    Period Weeks    Status New    Target Date 12/11/21      PT LONG TERM GOAL #3   Title Pt will demonstrate decrease in NDI by at least 19% in order to demonstrate clinically significant reduction in disability related to neck injury/pain    Baseline 6/19: 26%    Time 8    Period Weeks    Status New    Target Date 12/11/21      PT LONG TERM GOAL #5   Title Patient will improve cervical ROM to within 5 degrees of normal for return to PLOF    Baseline 6/19: see note    Time 8    Period Weeks    Status New    Target Date 12/11/21              Plan    Clinical Impression Statement Patient is having significant improvements in pain reduction. Decreased trigger points found and released due to improved muscle tissue tension. .Pt will benefit from skilled PT services to address deficits and return to pain-free function at home and work.   Personal Factors and Comorbidities Comorbidity  3+;Fitness;Past/Current Experience;Profession;Time since onset of injury/illness/exacerbation    Comorbidities cervicalgia, anxiety, numbness and tingling, OSA, depression, lymphedema.    Examination-Activity Limitations Self Feeding    Examination-Participation Restrictions Occupation;Other   eating   Stability/Clinical Decision Making Evolving/Moderate complexity    Rehab Potential Fair    PT Frequency 2x / week    PT Duration 8 weeks    PT Treatment/Interventions ADLs/Self  Care Home Management;Cryotherapy;Moist Heat;Iontophoresis '4mg'$ /ml Dexamethasone;Electrical Stimulation;Traction;Ultrasound;Therapeutic activities;Functional mobility training;Therapeutic exercise;Balance training;Neuromuscular re-education;Patient/family education;Manual techniques;Manual lymph drainage;Taping;Splinting;Energy conservation;Dry needling;Passive range of motion;Vasopneumatic Device;Vestibular;Visual/perceptual remediation/compensation;Canalith Repostioning    PT Next Visit Plan TDN, mobilization, cervical alignment    Consulted and Agree with Plan of Care Patient               Janna Arch, PT, DPT  11/13/2021, 8:40 AM

## 2021-11-13 ENCOUNTER — Ambulatory Visit: Payer: Managed Care, Other (non HMO)

## 2021-11-13 DIAGNOSIS — M542 Cervicalgia: Secondary | ICD-10-CM

## 2021-11-20 NOTE — Therapy (Signed)
OUTPATIENT PHYSICAL THERAPY TREATMENT NOTE   Patient Name: Danielle Lin MRN: [id number] DOB:June 16, 1975, 46 y.o., female Today's Date: 11/21/2021  PCP: Lennie Odor, PA  REFERRING PROVIDER: Johnnette Litter MD    PT End of Session - 11/21/21 0710     Visit Number 7    Number of Visits 16    Date for PT Re-Evaluation 12/11/21    Authorization Type [employer]    Authorization Time Period 10/16/21-12/11/21    PT Start Time 0715    PT Stop Time 0745    PT Time Calculation (min) 30 min    Activity Tolerance Patient tolerated treatment well;No increased pain    Behavior During Therapy WFL for tasks assessed/performed                 Past Medical History:  Diagnosis Date   Diabetes mellitus type 2, uncomplicated (HCC)    GERD without esophagitis    Heavy menses    Hypertension    IDA (iron deficiency anemia)    Obstructive sleep apnea on CPAP    Past Surgical History:  Procedure Laterality Date   BARIATRIC SURGERY     CARPAL TUNNEL RELEASE Bilateral    CHOLECYSTECTOMY     GASTROPLASTY DUODENAL SWITCH     Patient Active Problem List   Diagnosis Date Noted   Iron deficiency anemia due to chronic blood loss 08/13/2018    REFERRING DIAG: TMJ   THERAPY DIAG:  Cervicalgia  Rationale for Evaluation and Treatment Rehabilitation  PERTINENT HISTORY: Patient has a history of bite changes about 2 years ago. Had extensive PT with [facility] and Dr. Wynelle Beckmann cleared her several months ago for dental treatment. She was then seen for a temporary bridge to restore her bite, which required significant adjustment.  PMH includes cervicalgia, anxiety, numbness and tingling, OSA, depression, lymphedema. Patient is going through extensive dental procedures and is going back in a month  PRECAUTIONS: n/a  SUBJECTIVE: Patient reports her jaw is 50% back to normal, improving but not quite where she wants it to be.   PAIN:  Are you having pain? Yes: NPRS scale: 3/10 Pain location: R  jaw Pain description: aching Aggravating factors: n/a Relieving factors: n/a     TODAY'S TREATMENT:    Manual: Grade II mobilizations to jaw inferior glide x3 minutes L and R Grade II mobilization to cervical spine in supine position x 3 minutes Side bend with overpressure at occiput and glenohumeral joint 30 seconds x 2 trials Rotation with overpressure at occiput and GH joint 30 second seconds x2 trials Suboccipital release 30 seconds x 2 trials   Trigger Point Dry Needling (TDN), unbilled Education performed with patient regarding potential benefit of TDN. Reviewed precautions and risks with patient. Reviewed special precautions/risks over lung fields which include pneumothorax. Reviewed signs and symptoms of pneumothorax and advised pt to go to ER immediately if these symptoms develop advise them of dry needling treatment. Extensive time spent with pt to ensure full understanding of TDN risks. Pt provided verbal consent to treatment. TDN performed to  with 0.25 x 40 single needle placements with local twitch response (LTR). Pistoning technique utilized. Improved pain-free motion following intervention. Muscles targeted: massater, lateral pterygoid, temporalis. X 20 minutes     PATIENT EDUCATION: Education details: TDN, pain reduction  Person educated: Patient Education method: Explanation, Demonstration, Tactile cues, and Verbal cues Education comprehension: verbalized understanding, returned demonstration, verbal cues required, and tactile cues required   HOME EXERCISE PROGRAM: Access Code: [email or web address] URL:  [email or web address] Date: 10/19/2021 Prepared by: Janna Arch  Exercises - External Jaw Release  - 1 x daily - 7 x weekly - 2 sets - 10 reps - 5 hold - Jaw Protraction  - 1 x daily - 7 x weekly - 2 sets - 10 reps - 5 hold - Side-to-Side Jaw Movement  - 1 x daily - 7 x weekly - 2 sets - 10 reps - 5 hold   PT Short Term Goals       PT SHORT TERM GOAL #1    Title Patient will be independent in home exercise program to improve strength/mobility for better functional independence with ADLs.    Baseline 6/19: HEP next session    Time 4    Period Weeks    Status New    Target Date 11/13/21      PT SHORT TERM GOAL #2   Title Patient will report a worst pain of 5/10 on VAS in R jaw to improve tolerance with ADLs and reduced symptoms with activities.    Baseline 6/19: 10/10    Time 4    Period Weeks    Status New    Target Date 11/13/21              PT Long Term Goals       PT LONG TERM GOAL #1   Title Patient will increase FOTO score to equal to or greater than  90   to demonstrate statistically significant improvement in mobility and quality of life.    Baseline 6/19: 85%    Time 8    Period Weeks    Status New    Target Date 12/11/21      PT LONG TERM GOAL #2   Title Patient will report a worst pain of 3/10 on VAS in R jaw to improve tolerance with ADLs and reduced symptoms with activities.    Baseline 6/19: 10/10    Time 8    Period Weeks    Status New    Target Date 12/11/21      PT LONG TERM GOAL #3   Title Pt will demonstrate decrease in NDI by at least 19% in order to demonstrate clinically significant reduction in disability related to neck injury/pain    Baseline 6/19: 26%    Time 8    Period Weeks    Status New    Target Date 12/11/21      PT LONG TERM GOAL #5   Title Patient will improve cervical ROM to within 5 degrees of normal for return to PLOF    Baseline 6/19: see note    Time 8    Period Weeks    Status New    Target Date 12/11/21              Plan    Clinical Impression Statement Patient presents with excellent motivation. Large trigger points released in L jaw compared to right. This is different from previous sessions where primary trigger points were located by R mandible.  Pt will benefit from skilled PT services to address deficits and return to pain-free function at home and work.    Personal Factors and Comorbidities Comorbidity 3+;Fitness;Past/Current Experience;Profession;Time since onset of injury/illness/exacerbation    Comorbidities cervicalgia, anxiety, numbness and tingling, OSA, depression, lymphedema.    Examination-Activity Limitations Self Feeding    Examination-Participation Restrictions Occupation;Other   eating   Stability/Clinical Decision Making Evolving/Moderate complexity    Rehab Potential Fair    PT Frequency  2x / week    PT Duration 8 weeks    PT Treatment/Interventions ADLs/Self Care Home Management;Cryotherapy;Moist Heat;Iontophoresis '4mg'$ /ml Dexamethasone;Electrical Stimulation;Traction;Ultrasound;Therapeutic activities;Functional mobility training;Therapeutic exercise;Balance training;Neuromuscular re-education;Patient/family education;Manual techniques;Manual lymph drainage;Taping;Splinting;Energy conservation;Dry needling;Passive range of motion;Vasopneumatic Device;Vestibular;Visual/perceptual remediation/compensation;Canalith Repostioning    PT Next Visit Plan TDN, mobilization, cervical alignment    Consulted and Agree with Plan of Care Patient               Janna Arch, PT, DPT  11/21/2021, 7:49 AM

## 2021-11-21 ENCOUNTER — Ambulatory Visit: Payer: Managed Care, Other (non HMO)

## 2021-11-21 DIAGNOSIS — M542 Cervicalgia: Secondary | ICD-10-CM

## 2021-11-27 NOTE — Therapy (Signed)
OUTPATIENT PHYSICAL THERAPY TREATMENT NOTE   Patient Name: Danielle Lin MRN: [id number] DOB:01-27-1976, 46 y.o., female Today's Date: 11/28/2021  PCP: Lennie Odor, PA  REFERRING PROVIDER: Johnnette Litter MD    PT End of Session - 11/28/21 0850     Visit Number 8    Number of Visits 16    Date for PT Re-Evaluation 12/11/21    Authorization Type [employer]    Authorization Time Period 10/16/21-12/11/21    PT Start Time 0845    PT Stop Time 0917    PT Time Calculation (min) 32 min    Activity Tolerance Patient tolerated treatment well;No increased pain    Behavior During Therapy WFL for tasks assessed/performed                  Past Medical History:  Diagnosis Date   Diabetes mellitus type 2, uncomplicated (HCC)    GERD without esophagitis    Heavy menses    Hypertension    IDA (iron deficiency anemia)    Obstructive sleep apnea on CPAP    Past Surgical History:  Procedure Laterality Date   BARIATRIC SURGERY     CARPAL TUNNEL RELEASE Bilateral    CHOLECYSTECTOMY     GASTROPLASTY DUODENAL SWITCH     Patient Active Problem List   Diagnosis Date Noted   Iron deficiency anemia due to chronic blood loss 08/13/2018    REFERRING DIAG: TMJ   THERAPY DIAG:  Cervicalgia  Rationale for Evaluation and Treatment Rehabilitation  PERTINENT HISTORY: Patient has a history of bite changes about 2 years ago. Had extensive PT with [facility] and Dr. Wynelle Beckmann cleared her several months ago for dental treatment. She was then seen for a temporary bridge to restore her bite, which required significant adjustment.  PMH includes cervicalgia, anxiety, numbness and tingling, OSA, depression, lymphedema. Patient is going through extensive dental procedures and is going back in a month  PRECAUTIONS: n/a  SUBJECTIVE: Patient reports her pain is feeling better, just appearing when chewing. Later in month seeing dentist for additional procedure.   PAIN:  Are you having pain? Yes:  NPRS scale: 3/10 Pain location: R jaw Pain description: aching Aggravating factors: n/a Relieving factors: n/a     TODAY'S TREATMENT:    Manual: Grade II mobilizations to jaw inferior glide x3 minutes L and R Grade II mobilization to cervical spine in supine position x 3 minutes Side bend with overpressure at occiput and glenohumeral joint 30 seconds x 2 trials Rotation with overpressure at occiput and GH joint 30 second seconds x2 trials Suboccipital release 30 seconds x 2 trials   Trigger Point Dry Needling (TDN), unbilled Education performed with patient regarding potential benefit of TDN. Reviewed precautions and risks with patient. Reviewed special precautions/risks over lung fields which include pneumothorax. Reviewed signs and symptoms of pneumothorax and advised pt to go to ER immediately if these symptoms develop advise them of dry needling treatment. Extensive time spent with pt to ensure full understanding of TDN risks. Pt provided verbal consent to treatment. TDN performed to  with 0.25 x 40 single needle placements with local twitch response (LTR). Pistoning technique utilized. Improved pain-free motion following intervention. Muscles targeted: massater, lateral pterygoid, temporalis. X 20 minutes     PATIENT EDUCATION: Education details: TDN, pain reduction  Person educated: Patient Education method: Explanation, Demonstration, Tactile cues, and Verbal cues Education comprehension: verbalized understanding, returned demonstration, verbal cues required, and tactile cues required   HOME EXERCISE PROGRAM: Access Code: [email or web address]  URL: [email or web address] Date: 10/19/2021 Prepared by: Janna Arch  Exercises - External Jaw Release  - 1 x daily - 7 x weekly - 2 sets - 10 reps - 5 hold - Jaw Protraction  - 1 x daily - 7 x weekly - 2 sets - 10 reps - 5 hold - Side-to-Side Jaw Movement  - 1 x daily - 7 x weekly - 2 sets - 10 reps - 5 hold   PT Short Term  Goals       PT SHORT TERM GOAL #1   Title Patient will be independent in home exercise program to improve strength/mobility for better functional independence with ADLs.    Baseline 6/19: HEP next session    Time 4    Period Weeks    Status New    Target Date 11/13/21      PT SHORT TERM GOAL #2   Title Patient will report a worst pain of 5/10 on VAS in R jaw to improve tolerance with ADLs and reduced symptoms with activities.    Baseline 6/19: 10/10    Time 4    Period Weeks    Status New    Target Date 11/13/21              PT Long Term Goals       PT LONG TERM GOAL #1   Title Patient will increase FOTO score to equal to or greater than  90   to demonstrate statistically significant improvement in mobility and quality of life.    Baseline 6/19: 85%    Time 8    Period Weeks    Status New    Target Date 12/11/21      PT LONG TERM GOAL #2   Title Patient will report a worst pain of 3/10 on VAS in R jaw to improve tolerance with ADLs and reduced symptoms with activities.    Baseline 6/19: 10/10    Time 8    Period Weeks    Status New    Target Date 12/11/21      PT LONG TERM GOAL #3   Title Pt will demonstrate decrease in NDI by at least 19% in order to demonstrate clinically significant reduction in disability related to neck injury/pain    Baseline 6/19: 26%    Time 8    Period Weeks    Status New    Target Date 12/11/21      PT LONG TERM GOAL #5   Title Patient will improve cervical ROM to within 5 degrees of normal for return to PLOF    Baseline 6/19: see note    Time 8    Period Weeks    Status New    Target Date 12/11/21              Plan    Clinical Impression Statement Patient has more trigger points on L masseter than R this session. Her cervical ROM and muscle tension is improving with decreased limitations noted this session.  Patient reports relief of symptoms by end of session.  Pt will benefit from skilled PT services to address deficits  and return to pain-free function at home and work.   Personal Factors and Comorbidities Comorbidity 3+;Fitness;Past/Current Experience;Profession;Time since onset of injury/illness/exacerbation    Comorbidities cervicalgia, anxiety, numbness and tingling, OSA, depression, lymphedema.    Examination-Activity Limitations Self Feeding    Examination-Participation Restrictions Occupation;Other   eating   Stability/Clinical Decision Making Evolving/Moderate complexity    Rehab  Potential Fair    PT Frequency 2x / week    PT Duration 8 weeks    PT Treatment/Interventions ADLs/Self Care Home Management;Cryotherapy;Moist Heat;Iontophoresis '4mg'$ /ml Dexamethasone;Electrical Stimulation;Traction;Ultrasound;Therapeutic activities;Functional mobility training;Therapeutic exercise;Balance training;Neuromuscular re-education;Patient/family education;Manual techniques;Manual lymph drainage;Taping;Splinting;Energy conservation;Dry needling;Passive range of motion;Vasopneumatic Device;Vestibular;Visual/perceptual remediation/compensation;Canalith Repostioning    PT Next Visit Plan TDN, mobilization, cervical alignment    Consulted and Agree with Plan of Care Patient               Janna Arch, PT, DPT  11/28/2021, 9:23 AM

## 2021-11-28 ENCOUNTER — Ambulatory Visit: Payer: Managed Care, Other (non HMO) | Attending: Dentistry

## 2021-11-28 DIAGNOSIS — M542 Cervicalgia: Secondary | ICD-10-CM | POA: Insufficient documentation

## 2021-12-12 NOTE — Therapy (Signed)
OUTPATIENT PHYSICAL THERAPY TREATMENT NOTE/RECERT   Patient Name: Danielle Lin MRN: [id number] DOB:07/05/1975, 46 y.o., female Today's Date: 12/13/2021  PCP: Lennie Odor, PA  REFERRING PROVIDER: Johnnette Litter MD    PT End of Session - 12/13/21 0805     Visit Number 9    Number of Visits 25    Date for PT Re-Evaluation 02/07/22    Authorization Type [employer]    Authorization Time Period 10/16/21-12/11/21    PT Start Time 0800    PT Stop Time 0840    PT Time Calculation (min) 40 min    Activity Tolerance Patient tolerated treatment well;No increased pain    Behavior During Therapy WFL for tasks assessed/performed                   Past Medical History:  Diagnosis Date   Diabetes mellitus type 2, uncomplicated (HCC)    GERD without esophagitis    Heavy menses    Hypertension    IDA (iron deficiency anemia)    Obstructive sleep apnea on CPAP    Past Surgical History:  Procedure Laterality Date   BARIATRIC SURGERY     CARPAL TUNNEL RELEASE Bilateral    CHOLECYSTECTOMY     GASTROPLASTY DUODENAL SWITCH     Patient Active Problem List   Diagnosis Date Noted   Iron deficiency anemia due to chronic blood loss 08/13/2018    REFERRING DIAG: TMJ   THERAPY DIAG:  Cervicalgia  Rationale for Evaluation and Treatment Rehabilitation  PERTINENT HISTORY: Patient has a history of bite changes about 2 years ago. Had extensive PT with [facility] and Dr. Wynelle Beckmann cleared her several months ago for dental treatment. She was then seen for a temporary bridge to restore her bite, which required significant adjustment.  PMH includes cervicalgia, anxiety, numbness and tingling, OSA, depression, lymphedema. Patient is going through extensive dental procedures and is going back in a month  PRECAUTIONS: n/a  SUBJECTIVE: Patient has her oral surgery next week. Has been having less frequent pain.   PAIN:  Are you having pain? Yes: NPRS scale: 3/10 Pain location: R jaw Pain  description: aching Aggravating factors: n/a Relieving factors: n/a     TODAY'S TREATMENT:  Goals: HEP  VAS: 7/10  FOTO: 71%  NDI: 18/50  Cervical ROM    10/13/21  Right Left  Flexion 50  Extension 55  Side Bending 55 48  Rotation 70 55     Manual: Grade II mobilizations to jaw inferior glide x3 minutes L and R Grade II mobilization to cervical spine in supine position x 3 minutes Side bend with overpressure at occiput and glenohumeral joint 30 seconds x 2 trials Rotation with overpressure at occiput and GH joint 30 second seconds x2 trials Suboccipital release 30 seconds x 2 trials   Trigger Point Dry Needling (TDN), unbilled Education performed with patient regarding potential benefit of TDN. Reviewed precautions and risks with patient. Reviewed special precautions/risks over lung fields which include pneumothorax. Reviewed signs and symptoms of pneumothorax and advised pt to go to ER immediately if these symptoms develop advise them of dry needling treatment. Extensive time spent with pt to ensure full understanding of TDN risks. Pt provided verbal consent to treatment. TDN performed to  with 0.25 x 40 single needle placements with local twitch response (LTR). Pistoning technique utilized. Improved pain-free motion following intervention. Muscles targeted: massater, lateral pterygoid, temporalis. X 20 minutes     PATIENT EDUCATION: Education details: TDN, pain reduction  Person educated: Patient Education method: Explanation, Demonstration, Tactile cues, and Verbal cues Education comprehension: verbalized understanding, returned demonstration, verbal cues required, and tactile cues required   HOME EXERCISE PROGRAM: Access Code: [email or web address] URL: [email or web address] Date: 10/19/2021 Prepared by: Janna Arch  Exercises - External Jaw Release  - 1 x daily - 7 x weekly - 2 sets - 10 reps - 5 hold - Jaw Protraction  - 1 x daily - 7 x weekly - 2 sets - 10  reps - 5 hold - Side-to-Side Jaw Movement  - 1 x daily - 7 x weekly - 2 sets - 10 reps - 5 hold   PT Short Term Goals       PT SHORT TERM GOAL #1   Title Patient will be independent in home exercise program to improve strength/mobility for better functional independence with ADLs.    Baseline 6/19: HEP next session    Time 4    Period Weeks    Status MET   Target Date 11/13/21      PT SHORT TERM GOAL #2   Title Patient will report a worst pain of 5/10 on VAS in R jaw to improve tolerance with ADLs and reduced symptoms with activities.    Baseline 6/19: 10/10 8/16: 7/10    Time 4    Period Weeks    Status Partially Met   Target Date 01/10/2022               PT Long Term Goals       PT LONG TERM GOAL #1   Title Patient will increase FOTO score to equal to or greater than  90   to demonstrate statistically significant improvement in mobility and quality of life.    Baseline 6/19: 85%  8/16: 71%    Time 8    Period Weeks    Status On Going   Target Date 02/07/2022       PT LONG TERM GOAL #2   Title Patient will report a worst pain of 3/10 on VAS in R jaw to improve tolerance with ADLs and reduced symptoms with activities.    Baseline 6/19: 10/10 8/16: 7/10    Time 8    Period Weeks    Status Partially Met   Target Date 02/07/2022       PT LONG TERM GOAL #3   Title Pt will demonstrate decrease in NDI by at least 19% in order to demonstrate clinically significant reduction in disability related to neck injury/pain    Baseline 6/19: 26%  8/16: 18/50   Time 8    Period Weeks    Status On Going   Target Date 02/07/2022       PT LONG TERM GOAL #5   Title Patient will improve cervical ROM to within 5 degrees of normal for return to PLOF    Baseline 6/19: see note 8/16: see note   Time 8    Period Weeks    Status Partially Met   Target Date 02/07/2022               Plan    Clinical Impression Statement Patient's goals addressed this session. She is  having decreased episodes of high pain and decreased frequency of higher pain. Patient does have upcoming procedure next week and is expected to have regression due to severity of procedure.  She will need continued therapy s/p procedure as well as until procedure to maintain function. Pt will benefit from skilled PT services  to address deficits and return to pain-free function at home and work.   Personal Factors and Comorbidities Comorbidity 3+;Fitness;Past/Current Experience;Profession;Time since onset of injury/illness/exacerbation    Comorbidities cervicalgia, anxiety, numbness and tingling, OSA, depression, lymphedema.    Examination-Activity Limitations Self Feeding    Examination-Participation Restrictions Occupation;Other   eating   Stability/Clinical Decision Making Evolving/Moderate complexity    Rehab Potential Fair    PT Frequency 2x / week    PT Duration 8 weeks    PT Treatment/Interventions ADLs/Self Care Home Management;Cryotherapy;Moist Heat;Iontophoresis 55m/ml Dexamethasone;Electrical Stimulation;Traction;Ultrasound;Therapeutic activities;Functional mobility training;Therapeutic exercise;Balance training;Neuromuscular re-education;Patient/family education;Manual techniques;Manual lymph drainage;Taping;Splinting;Energy conservation;Dry needling;Passive range of motion;Vasopneumatic Device;Vestibular;Visual/perceptual remediation/compensation;Canalith Repostioning    PT Next Visit Plan TDN, mobilization, cervical alignment    Consulted and Agree with Plan of Care Patient               MJanna Arch PT, DPT  12/13/2021, 8:41 AM

## 2021-12-13 ENCOUNTER — Ambulatory Visit: Payer: Managed Care, Other (non HMO)

## 2021-12-13 DIAGNOSIS — M542 Cervicalgia: Secondary | ICD-10-CM

## 2021-12-18 NOTE — Therapy (Signed)
OUTPATIENT PHYSICAL THERAPY TREATMENT NOTE/Physical Therapy Progress Note   Dates of reporting period  10/16/21   to   12/19/21    Patient Name: Danielle Lin MRN: [id number] DOB:Dec 21, 1975, 46 y.o., female Today's Date: 12/19/2021  PCP: Lennie Odor, PA  REFERRING PROVIDER: Johnnette Litter MD    PT End of Session - 12/19/21 0708     Visit Number 10    Number of Visits 25    Date for PT Re-Evaluation 02/07/22    Authorization Type [employer]    Authorization Time Period 10/16/21-12/11/21    PT Start Time 0714    PT Stop Time 0755    PT Time Calculation (min) 41 min    Activity Tolerance Patient tolerated treatment well;No increased pain    Behavior During Therapy WFL for tasks assessed/performed                    Past Medical History:  Diagnosis Date   Diabetes mellitus type 2, uncomplicated (HCC)    GERD without esophagitis    Heavy menses    Hypertension    IDA (iron deficiency anemia)    Obstructive sleep apnea on CPAP    Past Surgical History:  Procedure Laterality Date   BARIATRIC SURGERY     CARPAL TUNNEL RELEASE Bilateral    CHOLECYSTECTOMY     GASTROPLASTY DUODENAL SWITCH     Patient Active Problem List   Diagnosis Date Noted   Iron deficiency anemia due to chronic blood loss 08/13/2018    REFERRING DIAG: TMJ   THERAPY DIAG:  Cervicalgia  Rationale for Evaluation and Treatment Rehabilitation  PERTINENT HISTORY: Patient has a history of bite changes about 2 years ago. Had extensive PT with [facility] and Dr. Wynelle Beckmann cleared her several months ago for dental treatment. She was then seen for a temporary bridge to restore her bite, which required significant adjustment.  PMH includes cervicalgia, anxiety, numbness and tingling, OSA, depression, lymphedema. Patient is going through extensive dental procedures and is going back in a month  PRECAUTIONS: n/a  SUBJECTIVE: Patient has her dental procedures tomorrow. Saw a physician about her knee  pain, had an MRI.   PAIN:  Are you having pain? Yes: NPRS scale: 3/10 Pain location: R jaw Pain description: aching Aggravating factors: n/a Relieving factors: n/a     TODAY'S TREATMENT:  Goals: HEP  VAS: 7/10  FOTO: 71%  NDI: 18/50  Cervical ROM    10/13/21  Right Left  Flexion 50  Extension 55  Side Bending 55 48  Rotation 70 55     Manual: Grade II mobilizations to jaw inferior glide x3 minutes L and R Grade II mobilization to cervical spine in supine position x 3 minutes Side bend with overpressure at occiput and glenohumeral joint 30 seconds x 2 trials Rotation with overpressure at occiput and GH joint 30 second seconds x2 trials Suboccipital release 30 seconds x 2 trials   Trigger Point Dry Needling (TDN), unbilled Education performed with patient regarding potential benefit of TDN. Reviewed precautions and risks with patient. Reviewed special precautions/risks over lung fields which include pneumothorax. Reviewed signs and symptoms of pneumothorax and advised pt to go to ER immediately if these symptoms develop advise them of dry needling treatment. Extensive time spent with pt to ensure full understanding of TDN risks. Pt provided verbal consent to treatment. TDN performed to  with 0.25 x 40 single needle placements with local twitch response (LTR). Pistoning technique utilized. Improved pain-free motion following  intervention. Muscles targeted: massater, lateral pterygoid, temporalis. X 20 minutes     PATIENT EDUCATION: Education details: TDN, pain reduction  Person educated: Patient Education method: Explanation, Demonstration, Tactile cues, and Verbal cues Education comprehension: verbalized understanding, returned demonstration, verbal cues required, and tactile cues required   HOME EXERCISE PROGRAM: Access Code: [email or web address] URL: [email or web address] Date: 10/19/2021 Prepared by: Janna Arch  Exercises - External Jaw Release  - 1 x daily  - 7 x weekly - 2 sets - 10 reps - 5 hold - Jaw Protraction  - 1 x daily - 7 x weekly - 2 sets - 10 reps - 5 hold - Side-to-Side Jaw Movement  - 1 x daily - 7 x weekly - 2 sets - 10 reps - 5 hold   PT Short Term Goals       PT SHORT TERM GOAL #1   Title Patient will be independent in home exercise program to improve strength/mobility for better functional independence with ADLs.    Baseline 6/19: HEP next session    Time 4    Period Weeks    Status MET   Target Date 11/13/21      PT SHORT TERM GOAL #2   Title Patient will report a worst pain of 5/10 on VAS in R jaw to improve tolerance with ADLs and reduced symptoms with activities.    Baseline 6/19: 10/10 8/16: 7/10    Time 4    Period Weeks    Status Partially Met   Target Date 01/10/2022               PT Long Term Goals       PT LONG TERM GOAL #1   Title Patient will increase FOTO score to equal to or greater than  90   to demonstrate statistically significant improvement in mobility and quality of life.    Baseline 6/19: 85%  8/16: 71%    Time 8    Period Weeks    Status On Going   Target Date 02/07/2022       PT LONG TERM GOAL #2   Title Patient will report a worst pain of 3/10 on VAS in R jaw to improve tolerance with ADLs and reduced symptoms with activities.    Baseline 6/19: 10/10 8/16: 7/10    Time 8    Period Weeks    Status Partially Met   Target Date 02/07/2022       PT LONG TERM GOAL #3   Title Pt will demonstrate decrease in NDI by at least 19% in order to demonstrate clinically significant reduction in disability related to neck injury/pain    Baseline 6/19: 26%  8/16: 18/50   Time 8    Period Weeks    Status On Going   Target Date 02/07/2022       PT LONG TERM GOAL #5   Title Patient will improve cervical ROM to within 5 degrees of normal for return to PLOF    Baseline 6/19: see note 8/16: see note   Time 8    Period Weeks    Status Partially Met   Target Date 02/07/2022                  Plan    Clinical Impression Statement Patient's goals addressed  previous session. Patient's condition has the potential to improve in response to therapy. Maximum improvement is yet to be obtained. The anticipated improvement is attainable and reasonable in a generally predictable time.  Patient to undergo surgery tomorrow for further dental procedures. Will benefit from continued therapy after upcoming regression. Pt will benefit from skilled PT services to address deficits and return to pain-free function at home and work.   Personal Factors and Comorbidities Comorbidity 3+;Fitness;Past/Current Experience;Profession;Time since onset of injury/illness/exacerbation    Comorbidities cervicalgia, anxiety, numbness and tingling, OSA, depression, lymphedema.    Examination-Activity Limitations Self Feeding    Examination-Participation Restrictions Occupation;Other   eating   Stability/Clinical Decision Making Evolving/Moderate complexity    Rehab Potential Fair    PT Frequency 2x / week    PT Duration 8 weeks    PT Treatment/Interventions ADLs/Self Care Home Management;Cryotherapy;Moist Heat;Iontophoresis 4mg /ml Dexamethasone;Electrical Stimulation;Traction;Ultrasound;Therapeutic activities;Functional mobility training;Therapeutic exercise;Balance training;Neuromuscular re-education;Patient/family education;Manual techniques;Manual lymph drainage;Taping;Splinting;Energy conservation;Dry needling;Passive range of motion;Vasopneumatic Device;Vestibular;Visual/perceptual remediation/compensation;Canalith Repostioning    PT Next Visit Plan TDN, mobilization, cervical alignment    Consulted and Agree with Plan of Care Patient               Janna Arch, PT, DPT  12/19/2021, 7:58 AM

## 2021-12-19 ENCOUNTER — Ambulatory Visit: Payer: Managed Care, Other (non HMO)

## 2021-12-19 DIAGNOSIS — M542 Cervicalgia: Secondary | ICD-10-CM

## 2021-12-25 NOTE — Therapy (Signed)
OUTPATIENT PHYSICAL THERAPY TREATMENT NOTE   Patient Name: Danielle Lin MRN: [id number] DOB:[birth year], 46 y.o., female Today's Date: 12/26/2021  PCP: Lennie Odor, PA  REFERRING PROVIDER: Johnnette Litter MD    PT End of Session - 12/26/21 0800     Visit Number 11    Number of Visits 25    Date for PT Re-Evaluation 02/07/22    Authorization Type [employer]    Authorization Time Period 10/16/21-12/11/21    PT Start Time 0800    PT Stop Time 0840    PT Time Calculation (min) 40 min    Activity Tolerance Patient tolerated treatment well;No increased pain    Behavior During Therapy WFL for tasks assessed/performed                     Past Medical History:  Diagnosis Date   Diabetes mellitus type 2, uncomplicated (HCC)    GERD without esophagitis    Heavy menses    Hypertension    IDA (iron deficiency anemia)    Obstructive sleep apnea on CPAP    Past Surgical History:  Procedure Laterality Date   BARIATRIC SURGERY     CARPAL TUNNEL RELEASE Bilateral    CHOLECYSTECTOMY     GASTROPLASTY DUODENAL SWITCH     Patient Active Problem List   Diagnosis Date Noted   Iron deficiency anemia due to chronic blood loss 08/13/2018    REFERRING DIAG: TMJ   THERAPY DIAG:  Cervicalgia  Rationale for Evaluation and Treatment Rehabilitation  PERTINENT HISTORY: Patient has a history of bite changes about 2 years ago. Had extensive PT with [facility] and Dr. Wynelle Beckmann cleared her several months ago for dental treatment. She was then seen for a temporary bridge to restore her bite, which required significant adjustment.  PMH includes cervicalgia, anxiety, numbness and tingling, OSA, depression, lymphedema. Patient is going through extensive dental procedures and is going back in a month  PRECAUTIONS: n/a  SUBJECTIVE: Patient had second of third procedure for dental work last week. Was sore over the weekend but is getting a little better  PAIN:  Are you having pain? Yes:  NPRS scale: 5/10 Pain location: R jaw Pain description: aching Aggravating factors: n/a Relieving factors: n/a     TODAY'S TREATMENT:     10/13/21  Right Left  Flexion 50  Extension 55  Side Bending 55 48  Rotation 70 55     Manual: Grade II mobilizations to jaw inferior glide x3 minutes L and R Grade II mobilization to cervical spine in supine position x 3 minutes Side bend with overpressure at occiput and glenohumeral joint 30 seconds x 2 trials Rotation with overpressure at occiput and GH joint 30 second seconds x2 trials Suboccipital release 30 seconds x 2 trials   Trigger Point Dry Needling (TDN), unbilled Education performed with patient regarding potential benefit of TDN. Reviewed precautions and risks with patient. Reviewed special precautions/risks over lung fields which include pneumothorax. Reviewed signs and symptoms of pneumothorax and advised pt to go to ER immediately if these symptoms develop advise them of dry needling treatment. Extensive time spent with pt to ensure full understanding of TDN risks. Pt provided verbal consent to treatment. TDN performed to  with 0.25 x 40 single needle placements with local twitch response (LTR). Pistoning technique utilized. Improved pain-free motion following intervention. Muscles targeted: massater, lateral pterygoid, temporalis. X 20 minutes     PATIENT EDUCATION: Education details: TDN, pain reduction  Person educated: Patient Education  method: Explanation, Demonstration, Tactile cues, and Verbal cues Education comprehension: verbalized understanding, returned demonstration, verbal cues required, and tactile cues required   HOME EXERCISE PROGRAM: Access Code: [email or web address] URL: [email or web address] Date: 10/19/2021 Prepared by: Janna Arch  Exercises - External Jaw Release  - 1 x daily - 7 x weekly - 2 sets - 10 reps - 5 hold - Jaw Protraction  - 1 x daily - 7 x weekly - 2 sets - 10 reps - 5 hold -  Side-to-Side Jaw Movement  - 1 x daily - 7 x weekly - 2 sets - 10 reps - 5 hold   PT Short Term Goals       PT SHORT TERM GOAL #1   Title Patient will be independent in home exercise program to improve strength/mobility for better functional independence with ADLs.    Baseline 6/19: HEP next session    Time 4    Period Weeks    Status MET   Target Date 11/13/21      PT SHORT TERM GOAL #2   Title Patient will report a worst pain of 5/10 on VAS in R jaw to improve tolerance with ADLs and reduced symptoms with activities.    Baseline 6/19: 10/10 8/16: 7/10    Time 4    Period Weeks    Status Partially Met   Target Date 01/10/2022               PT Long Term Goals       PT LONG TERM GOAL #1   Title Patient will increase FOTO score to equal to or greater than  90   to demonstrate statistically significant improvement in mobility and quality of life.    Baseline 6/19: 85%  8/16: 71%    Time 8    Period Weeks    Status On Going   Target Date 02/07/2022       PT LONG TERM GOAL #2   Title Patient will report a worst pain of 3/10 on VAS in R jaw to improve tolerance with ADLs and reduced symptoms with activities.    Baseline 6/19: 10/10 8/16: 7/10    Time 8    Period Weeks    Status Partially Met   Target Date 02/07/2022       PT LONG TERM GOAL #3   Title Pt will demonstrate decrease in NDI by at least 19% in order to demonstrate clinically significant reduction in disability related to neck injury/pain    Baseline 6/19: 26%  8/16: 18/50   Time 8    Period Weeks    Status On Going   Target Date 02/07/2022       PT LONG TERM GOAL #5   Title Patient will improve cervical ROM to within 5 degrees of normal for return to PLOF    Baseline 6/19: see note 8/16: see note   Time 8    Period Weeks    Status Partially Met   Target Date 02/07/2022                 Plan    Clinical Impression Statement Patient returning from second of three dental procedures. She  has significant increase in tension of fascial musculature as well as increased hypomobility of cervical spine. She has not returned to level prior to first dental procedure but does have slight regression compared to prior to second procedure.  Pt will benefit from skilled PT services to address deficits and return to pain-free  function at home and work.   Personal Factors and Comorbidities Comorbidity 3+;Fitness;Past/Current Experience;Profession;Time since onset of injury/illness/exacerbation    Comorbidities cervicalgia, anxiety, numbness and tingling, OSA, depression, lymphedema.    Examination-Activity Limitations Self Feeding    Examination-Participation Restrictions Occupation;Other   eating   Stability/Clinical Decision Making Evolving/Moderate complexity    Rehab Potential Fair    PT Frequency 2x / week    PT Duration 8 weeks    PT Treatment/Interventions ADLs/Self Care Home Management;Cryotherapy;Moist Heat;Iontophoresis 4mg /ml Dexamethasone;Electrical Stimulation;Traction;Ultrasound;Therapeutic activities;Functional mobility training;Therapeutic exercise;Balance training;Neuromuscular re-education;Patient/family education;Manual techniques;Manual lymph drainage;Taping;Splinting;Energy conservation;Dry needling;Passive range of motion;Vasopneumatic Device;Vestibular;Visual/perceptual remediation/compensation;Canalith Repostioning    PT Next Visit Plan TDN, mobilization, cervical alignment    Consulted and Agree with Plan of Care Patient               Janna Arch, PT, DPT  12/26/2021, 8:45 AM

## 2021-12-26 ENCOUNTER — Ambulatory Visit: Payer: Managed Care, Other (non HMO)

## 2021-12-26 DIAGNOSIS — M542 Cervicalgia: Secondary | ICD-10-CM

## 2021-12-26 NOTE — Therapy (Signed)
OUTPATIENT PHYSICAL THERAPY TREATMENT NOTE   Patient Name: Danielle Lin MRN: [id number] DOB:02-Dec-1975, 46 y.o., female Today's Date: 12/27/2021  PCP: Lennie Odor, PA  REFERRING PROVIDER: Johnnette Litter MD    PT End of Session - 12/27/21 0759     Visit Number 12    Number of Visits 25    Date for PT Re-Evaluation 02/07/22    Authorization Type [employer]    Authorization Time Period 10/16/21-12/11/21    PT Start Time 0800    PT Stop Time 0832    PT Time Calculation (min) 32 min    Activity Tolerance Patient tolerated treatment well;No increased pain    Behavior During Therapy WFL for tasks assessed/performed                      Past Medical History:  Diagnosis Date   Diabetes mellitus type 2, uncomplicated (HCC)    GERD without esophagitis    Heavy menses    Hypertension    IDA (iron deficiency anemia)    Obstructive sleep apnea on CPAP    Past Surgical History:  Procedure Laterality Date   BARIATRIC SURGERY     CARPAL TUNNEL RELEASE Bilateral    CHOLECYSTECTOMY     GASTROPLASTY DUODENAL SWITCH     Patient Active Problem List   Diagnosis Date Noted   Iron deficiency anemia due to chronic blood loss 08/13/2018    REFERRING DIAG: TMJ   THERAPY DIAG:  Cervicalgia  Rationale for Evaluation and Treatment Rehabilitation  PERTINENT HISTORY: Patient has a history of bite changes about 2 years ago. Had extensive PT with [facility] and Dr. Wynelle Beckmann cleared her several months ago for dental treatment. She was then seen for a temporary bridge to restore her bite, which required significant adjustment.  PMH includes cervicalgia, anxiety, numbness and tingling, OSA, depression, lymphedema. Patient is going through extensive dental procedures and is going back in a month  PRECAUTIONS: n/a  SUBJECTIVE: patient reports she felt better after last session. Is feeling tighter on L than R.  PAIN:  Are you having pain? Yes: NPRS scale: 3/10 Pain location: R  jaw Pain description: aching Aggravating factors: n/a Relieving factors: n/a     TODAY'S TREATMENT:     10/13/21  Right Left  Flexion 50  Extension 55  Side Bending 55 48  Rotation 70 55     Manual: Grade II mobilizations to jaw inferior glide x3 minutes L and R Grade II mobilization to cervical spine in supine position x 3 minutes Rotation with overpressure at occiput and GH joint 30 second seconds x2 trials Suboccipital release 30 seconds x 2 trials   Trigger Point Dry Needling (TDN), unbilled Education performed with patient regarding potential benefit of TDN. Reviewed precautions and risks with patient. Reviewed special precautions/risks over lung fields which include pneumothorax. Reviewed signs and symptoms of pneumothorax and advised pt to go to ER immediately if these symptoms develop advise them of dry needling treatment. Extensive time spent with pt to ensure full understanding of TDN risks. Pt provided verbal consent to treatment. TDN performed to  with 0.25 x 40 single needle placements with local twitch response (LTR). Pistoning technique utilized. Improved pain-free motion following intervention. Muscles targeted: massater, lateral pterygoid, temporalis. X 20 minutes     PATIENT EDUCATION: Education details: TDN, pain reduction  Person educated: Patient Education method: Explanation, Demonstration, Tactile cues, and Verbal cues Education comprehension: verbalized understanding, returned demonstration, verbal cues required, and tactile cues  required   HOME EXERCISE PROGRAM: Access Code: [email or web address] URL: [email or web address] Date: 10/19/2021 Prepared by: Janna Arch  Exercises - External Jaw Release  - 1 x daily - 7 x weekly - 2 sets - 10 reps - 5 hold - Jaw Protraction  - 1 x daily - 7 x weekly - 2 sets - 10 reps - 5 hold - Side-to-Side Jaw Movement  - 1 x daily - 7 x weekly - 2 sets - 10 reps - 5 hold   PT Short Term Goals       PT SHORT  TERM GOAL #1   Title Patient will be independent in home exercise program to improve strength/mobility for better functional independence with ADLs.    Baseline 6/19: HEP next session    Time 4    Period Weeks    Status MET   Target Date 11/13/21      PT SHORT TERM GOAL #2   Title Patient will report a worst pain of 5/10 on VAS in R jaw to improve tolerance with ADLs and reduced symptoms with activities.    Baseline 6/19: 10/10 8/16: 7/10    Time 4    Period Weeks    Status Partially Met   Target Date 01/10/2022               PT Long Term Goals       PT LONG TERM GOAL #1   Title Patient will increase FOTO score to equal to or greater than  90   to demonstrate statistically significant improvement in mobility and quality of life.    Baseline 6/19: 85%  8/16: 71%    Time 8    Period Weeks    Status On Going   Target Date 02/07/2022       PT LONG TERM GOAL #2   Title Patient will report a worst pain of 3/10 on VAS in R jaw to improve tolerance with ADLs and reduced symptoms with activities.    Baseline 6/19: 10/10 8/16: 7/10    Time 8    Period Weeks    Status Partially Met   Target Date 02/07/2022       PT LONG TERM GOAL #3   Title Pt will demonstrate decrease in NDI by at least 19% in order to demonstrate clinically significant reduction in disability related to neck injury/pain    Baseline 6/19: 26%  8/16: 18/50   Time 8    Period Weeks    Status On Going   Target Date 02/07/2022       PT LONG TERM GOAL #5   Title Patient will improve cervical ROM to within 5 degrees of normal for return to PLOF    Baseline 6/19: see note 8/16: see note   Time 8    Period Weeks    Status Partially Met   Target Date 02/07/2022                 Plan    Clinical Impression Statement Patient has multiple large trigger points of R masseter and upper trap that are relieved with TDN. Suboccipital release reduces tension throughout session. Patient educated on icing  face after TDN due to increased frequency and strength of contractions this session.   Pt will benefit from skilled PT services to address deficits and return to pain-free function at home and work.   Personal Factors and Comorbidities Comorbidity 3+;Fitness;Past/Current Experience;Profession;Time since onset of injury/illness/exacerbation    Comorbidities cervicalgia, anxiety, numbness and  tingling, OSA, depression, lymphedema.    Examination-Activity Limitations Self Feeding    Examination-Participation Restrictions Occupation;Other   eating   Stability/Clinical Decision Making Evolving/Moderate complexity    Rehab Potential Fair    PT Frequency 2x / week    PT Duration 8 weeks    PT Treatment/Interventions ADLs/Self Care Home Management;Cryotherapy;Moist Heat;Iontophoresis 4mg /ml Dexamethasone;Electrical Stimulation;Traction;Ultrasound;Therapeutic activities;Functional mobility training;Therapeutic exercise;Balance training;Neuromuscular re-education;Patient/family education;Manual techniques;Manual lymph drainage;Taping;Splinting;Energy conservation;Dry needling;Passive range of motion;Vasopneumatic Device;Vestibular;Visual/perceptual remediation/compensation;Canalith Repostioning    PT Next Visit Plan TDN, mobilization, cervical alignment    Consulted and Agree with Plan of Care Patient               Janna Arch, PT, DPT  12/27/2021, 8:33 AM

## 2021-12-27 ENCOUNTER — Ambulatory Visit: Payer: Managed Care, Other (non HMO)

## 2021-12-27 DIAGNOSIS — M542 Cervicalgia: Secondary | ICD-10-CM | POA: Diagnosis not present

## 2022-01-03 ENCOUNTER — Ambulatory Visit: Payer: Managed Care, Other (non HMO) | Admitting: Physical Therapy

## 2022-01-09 NOTE — Therapy (Signed)
OUTPATIENT PHYSICAL THERAPY TREATMENT NOTE   Patient Name: Danielle Lin MRN: [id number] DOB:12-12-1975, 46 y.o., female Today's Date: 01/10/2022  PCP: Lennie Odor, PA  REFERRING PROVIDER: Johnnette Litter MD    PT End of Session - 01/10/22 0830     Visit Number 13    Number of Visits 25    Date for PT Re-Evaluation 02/07/22    Authorization Type [employer]    Authorization Time Period 10/16/21-12/11/21    PT Start Time 0800    PT Stop Time 0831    PT Time Calculation (min) 31 min    Activity Tolerance Patient tolerated treatment well;No increased pain    Behavior During Therapy WFL for tasks assessed/performed                       Past Medical History:  Diagnosis Date   Diabetes mellitus type 2, uncomplicated (HCC)    GERD without esophagitis    Heavy menses    Hypertension    IDA (iron deficiency anemia)    Obstructive sleep apnea on CPAP    Past Surgical History:  Procedure Laterality Date   BARIATRIC SURGERY     CARPAL TUNNEL RELEASE Bilateral    CHOLECYSTECTOMY     GASTROPLASTY DUODENAL SWITCH     Patient Active Problem List   Diagnosis Date Noted   Iron deficiency anemia due to chronic blood loss 08/13/2018    REFERRING DIAG: TMJ   THERAPY DIAG:  Cervicalgia  Rationale for Evaluation and Treatment Rehabilitation  PERTINENT HISTORY: Patient has a history of bite changes about 2 years ago. Had extensive PT with [facility] and Dr. Wynelle Beckmann cleared her several months ago for dental treatment. She was then seen for a temporary bridge to restore her bite, which required significant adjustment.  PMH includes cervicalgia, anxiety, numbness and tingling, OSA, depression, lymphedema. Patient is going through extensive dental procedures and is going back in a month  PRECAUTIONS: n/a  SUBJECTIVE: Patient reports she goes back to dentist at end of the month for final procedure.   PAIN:  Are you having pain? Yes: NPRS scale: 3/10 Pain location: R  jaw Pain description: aching Aggravating factors: n/a Relieving factors: n/a     TODAY'S TREATMENT:     10/13/21  Right Left  Flexion 50  Extension 55  Side Bending 55 48  Rotation 70 55     Manual: Grade II mobilizations to jaw inferior glide x3 minutes L and R Grade II mobilization to cervical spine in supine position x 3 minutes Rotation with overpressure at occiput and GH joint 30 second seconds x2 trials Suboccipital release 30 seconds x 2 trials   Trigger Point Dry Needling (TDN), unbilled Education performed with patient regarding potential benefit of TDN. Reviewed precautions and risks with patient. Reviewed special precautions/risks over lung fields which include pneumothorax. Reviewed signs and symptoms of pneumothorax and advised pt to go to ER immediately if these symptoms develop advise them of dry needling treatment. Extensive time spent with pt to ensure full understanding of TDN risks. Pt provided verbal consent to treatment. TDN performed to  with 0.25 x 40 single needle placements with local twitch response (LTR). Pistoning technique utilized. Improved pain-free motion following intervention. Muscles targeted: massater, lateral pterygoid, temporalis. X 20 minutes     PATIENT EDUCATION: Education details: TDN, pain reduction  Person educated: Patient Education method: Explanation, Demonstration, Tactile cues, and Verbal cues Education comprehension: verbalized understanding, returned demonstration, verbal cues required, and  tactile cues required   HOME EXERCISE PROGRAM: Access Code: [email or web address] URL: [email or web address] Date: 10/19/2021 Prepared by: Precious Bard  Exercises - External Jaw Release  - 1 x daily - 7 x weekly - 2 sets - 10 reps - 5 hold - Jaw Protraction  - 1 x daily - 7 x weekly - 2 sets - 10 reps - 5 hold - Side-to-Side Jaw Movement  - 1 x daily - 7 x weekly - 2 sets - 10 reps - 5 hold   PT Short Term Goals       PT SHORT  TERM GOAL #1   Title Patient will be independent in home exercise program to improve strength/mobility for better functional independence with ADLs.    Baseline 6/19: HEP next session    Time 4    Period Weeks    Status MET   Target Date 11/13/21      PT SHORT TERM GOAL #2   Title Patient will report a worst pain of 5/10 on VAS in R jaw to improve tolerance with ADLs and reduced symptoms with activities.    Baseline 6/19: 10/10 8/16: 7/10    Time 4    Period Weeks    Status Partially Met   Target Date 01/10/2022               PT Long Term Goals       PT LONG TERM GOAL #1   Title Patient will increase FOTO score to equal to or greater than  90   to demonstrate statistically significant improvement in mobility and quality of life.    Baseline 6/19: 85%  8/16: 71%    Time 8    Period Weeks    Status On Going   Target Date 02/07/2022       PT LONG TERM GOAL #2   Title Patient will report a worst pain of 3/10 on VAS in R jaw to improve tolerance with ADLs and reduced symptoms with activities.    Baseline 6/19: 10/10 8/16: 7/10    Time 8    Period Weeks    Status Partially Met   Target Date 02/07/2022       PT LONG TERM GOAL #3   Title Pt will demonstrate decrease in NDI by at least 19% in order to demonstrate clinically significant reduction in disability related to neck injury/pain    Baseline 6/19: 26%  8/16: 18/50   Time 8    Period Weeks    Status On Going   Target Date 02/07/2022       PT LONG TERM GOAL #5   Title Patient will improve cervical ROM to within 5 degrees of normal for return to PLOF    Baseline 6/19: see note 8/16: see note   Time 8    Period Weeks    Status Partially Met   Target Date 02/07/2022                 Plan    Clinical Impression Statement Patient presents with excellent motivation throughout physical therapy session. She continues to have decreased tension each session indicating carryover between sessions. Trigger  points more tender on L side of face this session.   Pt will benefit from skilled PT services to address deficits and return to pain-free function at home and work.   Personal Factors and Comorbidities Comorbidity 3+;Fitness;Past/Current Experience;Profession;Time since onset of injury/illness/exacerbation    Comorbidities cervicalgia, anxiety, numbness and tingling, OSA, depression, lymphedema.  Examination-Activity Limitations Self Feeding    Examination-Participation Restrictions Occupation;Other   eating   Stability/Clinical Decision Making Evolving/Moderate complexity    Rehab Potential Fair    PT Frequency 2x / week    PT Duration 8 weeks    PT Treatment/Interventions ADLs/Self Care Home Management;Cryotherapy;Moist Heat;Iontophoresis 4mg /ml Dexamethasone;Electrical Stimulation;Traction;Ultrasound;Therapeutic activities;Functional mobility training;Therapeutic exercise;Balance training;Neuromuscular re-education;Patient/family education;Manual techniques;Manual lymph drainage;Taping;Splinting;Energy conservation;Dry needling;Passive range of motion;Vasopneumatic Device;Vestibular;Visual/perceptual remediation/compensation;Canalith Repostioning    PT Next Visit Plan TDN, mobilization, cervical alignment    Consulted and Agree with Plan of Care Patient               Janna Arch, PT, DPT  01/10/2022, 8:32 AM

## 2022-01-10 ENCOUNTER — Ambulatory Visit: Payer: Managed Care, Other (non HMO) | Attending: Dentistry

## 2022-01-10 DIAGNOSIS — M542 Cervicalgia: Secondary | ICD-10-CM | POA: Diagnosis present

## 2022-01-17 ENCOUNTER — Ambulatory Visit: Payer: Managed Care, Other (non HMO)

## 2022-01-19 ENCOUNTER — Ambulatory Visit
Admission: EM | Admit: 2022-01-19 | Discharge: 2022-01-19 | Disposition: A | Discharge status: Home / Self Care | Payer: Managed Care, Other (non HMO) | Attending: Urgent Care | Admitting: Urgent Care

## 2022-01-19 DIAGNOSIS — J069 Acute upper respiratory infection, unspecified: Secondary | ICD-10-CM

## 2022-01-19 DIAGNOSIS — J3489 Other specified disorders of nose and nasal sinuses: Secondary | ICD-10-CM

## 2022-01-19 DIAGNOSIS — R070 Pain in throat: Secondary | ICD-10-CM | POA: Diagnosis not present

## 2022-01-19 LAB — POCT RAPID STREP A (OFFICE): Rapid Strep A Screen: NEGATIVE

## 2022-01-19 MED ORDER — CETIRIZINE HCL 10 MG PO TABS: 10.0000 mg | ORAL_TABLET | Freq: Every day | ORAL | 0 refills | Status: DC

## 2022-01-19 MED ORDER — PREDNISONE 50 MG PO TABS: 50.0000 mg | ORAL_TABLET | Freq: Every day | ORAL | 0 refills | Status: DC

## 2022-01-19 MED ORDER — PSEUDOEPHEDRINE HCL 60 MG PO TABS: 60.0000 mg | ORAL_TABLET | Freq: Three times a day (TID) | ORAL | 0 refills | Status: DC | PRN

## 2022-01-19 MED ORDER — IBUPROFEN 600 MG PO TABS: 600.0000 mg | ORAL_TABLET | Freq: Four times a day (QID) | ORAL | 0 refills | Status: DC | PRN

## 2022-01-19 NOTE — ED Provider Notes (Signed)
[facility]  Note:  This document was prepared using Systems analyst and may include unintentional dictation errors.  MRN: [id number] DOB: 11/12/1975  Subjective:   Danielle Lin is a 46 y.o. female presenting for 4-day history of acute onset persistent throat pain, sinus pressure and sinus fullness.  No cough, chest pain, shortness of breath or wheezing.  Patient is wondering about strep or if she needs a prednisone course.  Has not done a COVID test, does not have concerns for this at this time. Reports diabetes diagnosis is well controlled.  No smoking.  Has not albuterol inhaler.  No current facility-administered medications for this encounter.  Current Outpatient Medications:    albuterol (VENTOLIN HFA) 108 (90 Base) MCG/ACT inhaler, 1 puff as needed, Disp: , Rfl:    ARIPiprazole (ABILIFY) 10 MG tablet, Take 10 mg by mouth daily., Disp: , Rfl:    B Complex Vitamins (VITAMIN B-COMPLEX) TABS, Take by mouth., Disp: , Rfl:    buPROPion (WELLBUTRIN XL) 300 MG 24 hr tablet, Take 1 tablet by mouth daily., Disp: , Rfl:    citalopram (CELEXA) 40 MG tablet, Take 40 mg by mouth daily., Disp: , Rfl:    gabapentin (NEURONTIN) 300 MG capsule, Take 1 capsule by mouth daily. (Patient not taking: Reported on 06/30/2021), Disp: , Rfl:    hydrOXYzine (ATARAX) 25 MG tablet, Take 50 mg by mouth daily., Disp: , Rfl:    Iron-Vit C-Vit B12-Folic Acid 220-254-2.706-2 MG TABS, Take 1 tablet by mouth daily., Disp: 30 each, Rfl: 2   levonorgestrel (LILETTA) 19.5 MCG/DAY IUD IUD, by Intrauterine route., Disp: , Rfl:    Multiple Vitamin (MULTI-VITAMIN) tablet, Take 1 tablet by mouth daily., Disp: , Rfl:    omeprazole (PRILOSEC) 20 MG capsule, Take 20 mg by mouth daily., Disp: , Rfl:    ondansetron (ZOFRAN) 4 MG tablet, ondansetron HCl 4 mg tablet, Disp: , Rfl:    Allergies  Allergen Reactions   Other    Tetanus-Diphtheria Toxoids Td Other (See Comments)    Raised rash and  hardness at injection site   Tetanus Toxoid Rash    Past Medical History:  Diagnosis Date   Diabetes mellitus type 2, uncomplicated (HCC)    GERD without esophagitis    Heavy menses    Hypertension    IDA (iron deficiency anemia)    Obstructive sleep apnea on CPAP      Past Surgical History:  Procedure Laterality Date   BARIATRIC SURGERY     CARPAL TUNNEL RELEASE Bilateral    CHOLECYSTECTOMY     GASTROPLASTY DUODENAL SWITCH      Family History  Problem Relation Age of Onset   Bladder Cancer Mother    Uterine cancer Mother    Breast cancer Maternal Grandmother     Social History   Tobacco Use   Smoking status: Never   Smokeless tobacco: Never  Vaping Use   Vaping Use: Never used  Substance Use Topics   Alcohol use: No   Drug use: No    ROS   Objective:   Vitals: BP 137/85   Pulse 68   Temp 99.3 F (37.4 C)   Resp 19   Ht '4\' 11"'$  (1.499 m)   Wt 275 lb (124.7 kg)   SpO2 96%   BMI 55.54 kg/m   Physical Exam Constitutional:      General: She is not in acute distress.    Appearance: Normal appearance. She is well-developed and normal weight. She is  not ill-appearing, toxic-appearing or diaphoretic.  HENT:     Head: Normocephalic and atraumatic.     Right Ear: Tympanic membrane, ear canal and external ear normal. No drainage or tenderness. No middle ear effusion. There is no impacted cerumen. Tympanic membrane is not erythematous.     Left Ear: Tympanic membrane, ear canal and external ear normal. No drainage or tenderness.  No middle ear effusion. There is no impacted cerumen. Tympanic membrane is not erythematous.     Nose: No congestion or rhinorrhea.     Mouth/Throat:     Mouth: Mucous membranes are moist. No oral lesions.     Pharynx: Posterior oropharyngeal erythema (with associated thick of post-nasal drainage overlying pharynx) present. No pharyngeal swelling, oropharyngeal exudate or uvula swelling.     Tonsils: No tonsillar exudate or tonsillar  abscesses.  Eyes:     General: No scleral icterus.       Right eye: No discharge.        Left eye: No discharge.     Extraocular Movements: Extraocular movements intact.     Right eye: Normal extraocular motion.     Left eye: Normal extraocular motion.     Conjunctiva/sclera: Conjunctivae normal.  Cardiovascular:     Rate and Rhythm: Normal rate.  Pulmonary:     Effort: Pulmonary effort is normal.  Musculoskeletal:     Cervical back: Normal range of motion and neck supple.  Lymphadenopathy:     Cervical: No cervical adenopathy.  Skin:    General: Skin is warm and dry.  Neurological:     General: No focal deficit present.     Mental Status: She is alert and oriented to person, place, and time.  Psychiatric:        Mood and Affect: Mood normal.        Behavior: Behavior normal.     Results for orders placed or performed during the hospital encounter of 01/19/22 (from the past 24 hour(s))  POCT rapid strep A     Status: None   Collection Time: 01/19/22 11:48 AM  Result Value Ref Range   Rapid Strep A Screen Negative Negative    Assessment and Plan :   PDMP not reviewed this encounter.  1. Viral upper respiratory infection   2. Sinus drainage   3. Sinus pressure   4. Throat pain     Patient declined a COVID test.  Strep culture pending.  Suspect viral URI, viral syndrome. Physical exam findings reassuring and vital signs stable for discharge. Advised supportive care, offered symptomatic relief.  I ordered patient's request for prednisone course but recommended holding off on it for now.  I provided her with a printed prescription for this.  In the event she continues to have symptoms going into next week and strep culture is negative, she can start it.  Otherwise she can return to clinic for recheck.  Counseled patient on potential for adverse effects with medications prescribed/recommended today, ER and return-to-clinic precautions discussed, patient verbalized  understanding.     Jaynee Eagles, [place] 01/19/22 1251

## 2022-01-19 NOTE — Discharge Instructions (Addendum)
We will manage this as a viral illness. For sore throat or cough try using a honey-based tea. Use 3 teaspoons of honey with juice squeezed from half lemon. Place shaved pieces of ginger into 1/2-1 cup of water and warm over stove top. Then mix the ingredients and repeat every 4 hours as needed. Please take ibuprofen '600mg'$  every 6 hours with food alternating with OR taken together with Tylenol '500mg'$ -'650mg'$  every 6 hours for throat pain, fevers, aches and pains. Hydrate very well with at least 2 liters of water. Eat light meals such as soups (chicken and noodles, vegetable, chicken and wild rice).  Do not eat foods that you are allergic to.  Taking an antihistamine like Zyrtec can help against postnasal drainage, sinus congestion which can cause sinus pain, sinus headaches, throat pain, painful swallowing, coughing.  You can take this together with pseudoephedrine (Sudafed) at a dose of 60 mg 3 times a day or twice daily as needed for the same kind of nasal drip, congestion.    If your symptoms persist into next week, you can trial prednisone or return to our clinic for a recheck. Our nurse will let you know about your second strep test (the throat culture) on Monday.

## 2022-01-19 NOTE — ED Triage Notes (Signed)
Patient to Urgent Care with complaints of a sore throat x4 days, reports feeling run down and fatigued. Denies any known fevers.

## 2022-01-23 NOTE — Therapy (Signed)
OUTPATIENT PHYSICAL THERAPY TREATMENT NOTE   Patient Name: Danielle Lin MRN: [id number] DOB:1975-06-07, 46 y.o., female Today's Date: 01/24/2022  PCP: Lennie Odor, PA  REFERRING PROVIDER: Johnnette Litter MD    PT End of Session - 01/24/22 0800     Visit Number 14    Number of Visits 25    Date for PT Re-Evaluation 02/07/22    Authorization Type [employer]    Authorization Time Period 10/16/21-12/11/21    PT Start Time 0800    PT Stop Time 0835    PT Time Calculation (min) 35 min    Activity Tolerance Patient tolerated treatment well;No increased pain    Behavior During Therapy WFL for tasks assessed/performed                        Past Medical History:  Diagnosis Date   Diabetes mellitus type 2, uncomplicated (HCC)    GERD without esophagitis    Heavy menses    Hypertension    IDA (iron deficiency anemia)    Obstructive sleep apnea on CPAP    Past Surgical History:  Procedure Laterality Date   BARIATRIC SURGERY     CARPAL TUNNEL RELEASE Bilateral    CHOLECYSTECTOMY     GASTROPLASTY DUODENAL SWITCH     Patient Active Problem List   Diagnosis Date Noted   Iron deficiency anemia due to chronic blood loss 08/13/2018    REFERRING DIAG: TMJ   THERAPY DIAG:  Cervicalgia  Rationale for Evaluation and Treatment Rehabilitation  PERTINENT HISTORY: Patient has a history of bite changes about 2 years ago. Had extensive PT with [facility] and Dr. Wynelle Beckmann cleared her several months ago for dental treatment. She was then seen for a temporary bridge to restore her bite, which required significant adjustment.  PMH includes cervicalgia, anxiety, numbness and tingling, OSA, depression, lymphedema. Patient is going through extensive dental procedures and is going back in a month  PRECAUTIONS: n/a  SUBJECTIVE: Patient has additional dental procedure tomorrow. Missed last session due to upper respiratory infection.   PAIN:  Are you having pain? Yes: NPRS  scale: 2/10 Pain location: R jaw Pain description: aching Aggravating factors: n/a Relieving factors: n/a     TODAY'S TREATMENT:     10/13/21  Right Left  Flexion 50  Extension 55  Side Bending 55 48  Rotation 70 55     Manual: Grade II mobilizations to jaw inferior glide x3 minutes L and R Grade II mobilization to cervical spine in supine position x 3 minutes Rotation with overpressure at occiput and GH joint 30 second seconds x2 trials Suboccipital release 30 seconds x 2 trials   Trigger Point Dry Needling (TDN), unbilled Education performed with patient regarding potential benefit of TDN. Reviewed precautions and risks with patient. Reviewed special precautions/risks over lung fields which include pneumothorax. Reviewed signs and symptoms of pneumothorax and advised pt to go to ER immediately if these symptoms develop advise them of dry needling treatment. Extensive time spent with pt to ensure full understanding of TDN risks. Pt provided verbal consent to treatment. TDN performed to  with 0.25 x 40 single needle placements with local twitch response (LTR). Pistoning technique utilized. Improved pain-free motion following intervention. Muscles targeted: massater, lateral pterygoid, temporalis. X 20 minutes     PATIENT EDUCATION: Education details: TDN, pain reduction  Person educated: Patient Education method: Explanation, Demonstration, Tactile cues, and Verbal cues Education comprehension: verbalized understanding, returned demonstration, verbal cues required, and  tactile cues required   HOME EXERCISE PROGRAM: Access Code: [email or web address] URL: [email or web address] Date: 10/19/2021 Prepared by: Janna Arch  Exercises - External Jaw Release  - 1 x daily - 7 x weekly - 2 sets - 10 reps - 5 hold - Jaw Protraction  - 1 x daily - 7 x weekly - 2 sets - 10 reps - 5 hold - Side-to-Side Jaw Movement  - 1 x daily - 7 x weekly - 2 sets - 10 reps - 5 hold   PT Short  Term Goals       PT SHORT TERM GOAL #1   Title Patient will be independent in home exercise program to improve strength/mobility for better functional independence with ADLs.    Baseline 6/19: HEP next session    Time 4    Period Weeks    Status MET   Target Date 11/13/21      PT SHORT TERM GOAL #2   Title Patient will report a worst pain of 5/10 on VAS in R jaw to improve tolerance with ADLs and reduced symptoms with activities.    Baseline 6/19: 10/10 8/16: 7/10    Time 4    Period Weeks    Status Partially Met   Target Date 01/10/2022               PT Long Term Goals       PT LONG TERM GOAL #1   Title Patient will increase FOTO score to equal to or greater than  90   to demonstrate statistically significant improvement in mobility and quality of life.    Baseline 6/19: 85%  8/16: 71%    Time 8    Period Weeks    Status On Going   Target Date 02/07/2022       PT LONG TERM GOAL #2   Title Patient will report a worst pain of 3/10 on VAS in R jaw to improve tolerance with ADLs and reduced symptoms with activities.    Baseline 6/19: 10/10 8/16: 7/10    Time 8    Period Weeks    Status Partially Met   Target Date 02/07/2022       PT LONG TERM GOAL #3   Title Pt will demonstrate decrease in NDI by at least 19% in order to demonstrate clinically significant reduction in disability related to neck injury/pain    Baseline 6/19: 26%  8/16: 18/50   Time 8    Period Weeks    Status On Going   Target Date 02/07/2022       PT LONG TERM GOAL #5   Title Patient will improve cervical ROM to within 5 degrees of normal for return to PLOF    Baseline 6/19: see note 8/16: see note   Time 8    Period Weeks    Status Partially Met   Target Date 02/07/2022                 Plan    Clinical Impression Statement Patient to have final procedure tomorrow. Her right facial muscles have significantly improved with trigger points primarily found in left sided  musculature. Increased tension in L cervical spine additionally noted.   Pt will benefit from skilled PT services to address deficits and return to pain-free function at home and work.   Personal Factors and Comorbidities Comorbidity 3+;Fitness;Past/Current Experience;Profession;Time since onset of injury/illness/exacerbation    Comorbidities cervicalgia, anxiety, numbness and tingling, OSA, depression, lymphedema.    Examination-Activity  Limitations Self Feeding    Examination-Participation Restrictions Occupation;Other   eating   Stability/Clinical Decision Making Evolving/Moderate complexity    Rehab Potential Fair    PT Frequency 2x / week    PT Duration 8 weeks    PT Treatment/Interventions ADLs/Self Care Home Management;Cryotherapy;Moist Heat;Iontophoresis 4mg /ml Dexamethasone;Electrical Stimulation;Traction;Ultrasound;Therapeutic activities;Functional mobility training;Therapeutic exercise;Balance training;Neuromuscular re-education;Patient/family education;Manual techniques;Manual lymph drainage;Taping;Splinting;Energy conservation;Dry needling;Passive range of motion;Vasopneumatic Device;Vestibular;Visual/perceptual remediation/compensation;Canalith Repostioning    PT Next Visit Plan TDN, mobilization, cervical alignment    Consulted and Agree with Plan of Care Patient               Janna Arch, PT, DPT  01/24/2022, 8:37 AM

## 2022-01-24 ENCOUNTER — Ambulatory Visit: Payer: Managed Care, Other (non HMO)

## 2022-01-24 DIAGNOSIS — M542 Cervicalgia: Secondary | ICD-10-CM | POA: Diagnosis not present

## 2022-01-30 NOTE — Therapy (Signed)
OUTPATIENT PHYSICAL THERAPY TREATMENT NOTE   Patient Name: Danielle Lin MRN: [id number] DOB:1975-11-08, 46 y.o., female Today's Date: 01/31/2022  PCP: Lennie Odor, PA  REFERRING PROVIDER: Johnnette Litter MD    PT End of Session - 01/31/22 0752     Visit Number 15    Number of Visits 25    Date for PT Re-Evaluation 02/07/22    Authorization Type [employer]    Authorization Time Period 10/16/21-12/11/21    PT Start Time 0758    PT Stop Time 0834    PT Time Calculation (min) 36 min    Activity Tolerance Patient tolerated treatment well;No increased pain    Behavior During Therapy WFL for tasks assessed/performed                         Past Medical History:  Diagnosis Date   Diabetes mellitus type 2, uncomplicated (HCC)    GERD without esophagitis    Heavy menses    Hypertension    IDA (iron deficiency anemia)    Obstructive sleep apnea on CPAP    Past Surgical History:  Procedure Laterality Date   BARIATRIC SURGERY     CARPAL TUNNEL RELEASE Bilateral    CHOLECYSTECTOMY     GASTROPLASTY DUODENAL SWITCH     Patient Active Problem List   Diagnosis Date Noted   Iron deficiency anemia due to chronic blood loss 08/13/2018    REFERRING DIAG: TMJ   THERAPY DIAG:  Cervicalgia  Rationale for Evaluation and Treatment Rehabilitation  PERTINENT HISTORY: Patient has a history of bite changes about 2 years ago. Had extensive PT with [facility] and Dr. Wynelle Beckmann cleared her several months ago for dental treatment. She was then seen for a temporary bridge to restore her bite, which required significant adjustment.  PMH includes cervicalgia, anxiety, numbness and tingling, OSA, depression, lymphedema. Patient is going through extensive dental procedures and is going back in a month  PRECAUTIONS: n/a  SUBJECTIVE: Patient had second to last dental procedure since last seen. Procedure was on L side of mouth. Has one final procedure scheduled at end of month.    PAIN:  Are you having pain? Yes: NPRS scale: 2/10 Pain location: R jaw Pain description: aching Aggravating factors: n/a Relieving factors: n/a     TODAY'S TREATMENT:     10/13/21  Right Left  Flexion 50  Extension 55  Side Bending 55 48  Rotation 70 55     Manual: Grade II mobilizations to jaw inferior glide x3 minutes L and R Grade II mobilization to cervical spine in supine position x 3 minutes Rotation with overpressure at occiput and GH joint 30 second seconds x2 trials Suboccipital release 30 seconds x 2 trials   Education on tennis balls for suboccipital release at home  Trigger Point Dry Needling (TDN), unbilled Education performed with patient regarding potential benefit of TDN. Reviewed precautions and risks with patient. Reviewed special precautions/risks over lung fields which include pneumothorax. Reviewed signs and symptoms of pneumothorax and advised pt to go to ER immediately if these symptoms develop advise them of dry needling treatment. Extensive time spent with pt to ensure full understanding of TDN risks. Pt provided verbal consent to treatment. TDN performed to  with 0.25 x 40 single needle placements with local twitch response (LTR). Pistoning technique utilized. Improved pain-free motion following intervention. Muscles targeted: massater, lateral pterygoid, temporalis. X 20 minutes     PATIENT EDUCATION: Education details: TDN, pain reduction  Person educated: Patient Education method: Explanation, Demonstration, Tactile cues, and Verbal cues Education comprehension: verbalized understanding, returned demonstration, verbal cues required, and tactile cues required   HOME EXERCISE PROGRAM: Access Code: [email or web address] URL: [email or web address] Date: 10/19/2021 Prepared by: Janna Arch  Exercises - External Jaw Release  - 1 x daily - 7 x weekly - 2 sets - 10 reps - 5 hold - Jaw Protraction  - 1 x daily - 7 x weekly - 2 sets - 10 reps -  5 hold - Side-to-Side Jaw Movement  - 1 x daily - 7 x weekly - 2 sets - 10 reps - 5 hold   PT Short Term Goals       PT SHORT TERM GOAL #1   Title Patient will be independent in home exercise program to improve strength/mobility for better functional independence with ADLs.    Baseline 6/19: HEP next session    Time 4    Period Weeks    Status MET   Target Date 11/13/21      PT SHORT TERM GOAL #2   Title Patient will report a worst pain of 5/10 on VAS in R jaw to improve tolerance with ADLs and reduced symptoms with activities.    Baseline 6/19: 10/10 8/16: 7/10    Time 4    Period Weeks    Status Partially Met   Target Date 01/10/2022               PT Long Term Goals       PT LONG TERM GOAL #1   Title Patient will increase FOTO score to equal to or greater than  90   to demonstrate statistically significant improvement in mobility and quality of life.    Baseline 6/19: 85%  8/16: 71%    Time 8    Period Weeks    Status On Going   Target Date 02/07/2022       PT LONG TERM GOAL #2   Title Patient will report a worst pain of 3/10 on VAS in R jaw to improve tolerance with ADLs and reduced symptoms with activities.    Baseline 6/19: 10/10 8/16: 7/10    Time 8    Period Weeks    Status Partially Met   Target Date 02/07/2022       PT LONG TERM GOAL #3   Title Pt will demonstrate decrease in NDI by at least 19% in order to demonstrate clinically significant reduction in disability related to neck injury/pain    Baseline 6/19: 26%  8/16: 18/50   Time 8    Period Weeks    Status On Going   Target Date 02/07/2022       PT LONG TERM GOAL #5   Title Patient will improve cervical ROM to within 5 degrees of normal for return to PLOF    Baseline 6/19: see note 8/16: see note   Time 8    Period Weeks    Status Partially Met   Target Date 02/07/2022                 Plan    Clinical Impression Statement Patient has significant tension and tenderness on L  masseter and cervical region. She has significant trigger point releases and is educated on need for icing s/p session for optimal results.   Pt will benefit from skilled PT services to address deficits and return to pain-free function at home and work.   Personal Factors and Comorbidities Comorbidity  3+;Fitness;Past/Current Experience;Profession;Time since onset of injury/illness/exacerbation    Comorbidities cervicalgia, anxiety, numbness and tingling, OSA, depression, lymphedema.    Examination-Activity Limitations Self Feeding    Examination-Participation Restrictions Occupation;Other   eating   Stability/Clinical Decision Making Evolving/Moderate complexity    Rehab Potential Fair    PT Frequency 2x / week    PT Duration 8 weeks    PT Treatment/Interventions ADLs/Self Care Home Management;Cryotherapy;Moist Heat;Iontophoresis 4mg /ml Dexamethasone;Electrical Stimulation;Traction;Ultrasound;Therapeutic activities;Functional mobility training;Therapeutic exercise;Balance training;Neuromuscular re-education;Patient/family education;Manual techniques;Manual lymph drainage;Taping;Splinting;Energy conservation;Dry needling;Passive range of motion;Vasopneumatic Device;Vestibular;Visual/perceptual remediation/compensation;Canalith Repostioning    PT Next Visit Plan TDN, mobilization, cervical alignment    Consulted and Agree with Plan of Care Patient               Janna Arch, PT, DPT  01/31/2022, 8:36 AM

## 2022-01-31 ENCOUNTER — Ambulatory Visit: Payer: Managed Care, Other (non HMO) | Attending: Dentistry

## 2022-01-31 DIAGNOSIS — M542 Cervicalgia: Secondary | ICD-10-CM | POA: Insufficient documentation

## 2022-02-05 ENCOUNTER — Other Ambulatory Visit: Payer: Self-pay | Admitting: Orthopedic Surgery

## 2022-02-06 NOTE — Therapy (Signed)
OUTPATIENT PHYSICAL THERAPY TREATMENT NOTE/RECERT?   Patient Name: Danielle Lin MRN: [id number] DOB:04/20/1976, 46 y.o., female Today's Date: 02/06/2022  PCP: Redmon, Noelle, PA  REFERRING PROVIDER: Makhlouf, Mary MD                  Past Medical History:  Diagnosis Date   Diabetes mellitus type 2, uncomplicated (HCC)    GERD without esophagitis    Heavy menses    Hypertension    IDA (iron deficiency anemia)    Obstructive sleep apnea on CPAP    Past Surgical History:  Procedure Laterality Date   BARIATRIC SURGERY     CARPAL TUNNEL RELEASE Bilateral    CHOLECYSTECTOMY     GASTROPLASTY DUODENAL SWITCH     Patient Active Problem List   Diagnosis Date Noted   Iron deficiency anemia due to chronic blood loss 08/13/2018    REFERRING DIAG: TMJ   THERAPY DIAG:  No diagnosis found.  Rationale for Evaluation and Treatment Rehabilitation  PERTINENT HISTORY: Patient has a history of bite changes about 2 years ago. Had extensive PT with [facility] and Dr. Mary Kay Hannah cleared her several months ago for dental treatment. She was then seen for a temporary bridge to restore her bite, which required significant adjustment.  PMH includes cervicalgia, anxiety, numbness and tingling, OSA, depression, lymphedema. Patient is going through extensive dental procedures and is going back in a month  PRECAUTIONS: n/a  SUBJECTIVE: Patient had second to last dental procedure since last seen. Procedure was on L side of mouth. ***  PAIN:  Are you having pain? Yes: NPRS scale: 2/10 Pain location: R jaw Pain description: aching Aggravating factors: n/a Relieving factors: n/a     TODAY'S TREATMENT:     10/13/21  Right Left  Flexion 50  Extension 55  Side Bending 55 48  Rotation 70 55   Goals:   10/11 Right Left  Flexion   Extension   Side Bending    Rotation      VAS FOTO ROM NDI   Manual: Grade II mobilizations to jaw inferior glide x3 minutes L and  R Grade II mobilization to cervical spine in supine position x 3 minutes Rotation with overpressure at occiput and GH joint 30 second seconds x2 trials Suboccipital release 30 seconds x 2 trials   Education on tennis balls for suboccipital release at home  Trigger Point Dry Needling (TDN), unbilled Education performed with patient regarding potential benefit of TDN. Reviewed precautions and risks with patient. Reviewed special precautions/risks over lung fields which include pneumothorax. Reviewed signs and symptoms of pneumothorax and advised pt to go to ER immediately if these symptoms develop advise them of dry needling treatment. Extensive time spent with pt to ensure full understanding of TDN risks. Pt provided verbal consent to treatment. TDN performed to  with 0.25 x 40 single needle placements with local twitch response (LTR). Pistoning technique utilized. Improved pain-free motion following intervention. Muscles targeted: massater, lateral pterygoid, temporalis. X 20 minutes     PATIENT EDUCATION: Education details: TDN, pain reduction  Person educated: Patient Education method: Explanation, Demonstration, Tactile cues, and Verbal cues Education comprehension: verbalized understanding, returned demonstration, verbal cues required, and tactile cues required   HOME EXERCISE PROGRAM: Access Code: [email or web address] URL: [email or web address] Date: 10/19/2021 Prepared by: Marina Moser  Exercises - External Jaw Release  - 1 x daily - 7 x weekly - 2 sets - 10 reps - 5 hold - Jaw Protraction  - 1 x daily -   7 x weekly - 2 sets - 10 reps - 5 hold - Side-to-Side Jaw Movement  - 1 x daily - 7 x weekly - 2 sets - 10 reps - 5 hold   PT Short Term Goals       PT SHORT TERM GOAL #1   Title Patient will be independent in home exercise program to improve strength/mobility for better functional independence with ADLs.    Baseline 6/19: HEP next session    Time 4    Period Weeks     Status MET   Target Date 11/13/21      PT SHORT TERM GOAL #2   Title Patient will report a worst pain of 5/10 on VAS in R jaw to improve tolerance with ADLs and reduced symptoms with activities.    Baseline 6/19: 10/10 8/16: 7/10    Time 4    Period Weeks    Status Partially Met   Target Date 01/10/2022               PT Long Term Goals       PT LONG TERM GOAL #1   Title Patient will increase FOTO score to equal to or greater than  90   to demonstrate statistically significant improvement in mobility and quality of life.    Baseline 6/19: 85%  8/16: 71%    Time 8    Period Weeks    Status On Going   Target Date 02/07/2022       PT LONG TERM GOAL #2   Title Patient will report a worst pain of 3/10 on VAS in R jaw to improve tolerance with ADLs and reduced symptoms with activities.    Baseline 6/19: 10/10 8/16: 7/10    Time 8    Period Weeks    Status Partially Met   Target Date 02/07/2022       PT LONG TERM GOAL #3   Title Pt will demonstrate decrease in NDI by at least 19% in order to demonstrate clinically significant reduction in disability related to neck injury/pain    Baseline 6/19: 26%  8/16: 18/50   Time 8    Period Weeks    Status On Going   Target Date 02/07/2022       PT LONG TERM GOAL #5   Title Patient will improve cervical ROM to within 5 degrees of normal for return to PLOF    Baseline 6/19: see note 8/16: see note   Time 8    Period Weeks    Status Partially Met   Target Date 02/07/2022                 Plan    Clinical Impression Statement ***   Pt will benefit from skilled PT services to address deficits and return to pain-free function at home and work.   Personal Factors and Comorbidities Comorbidity 3+;Fitness;Past/Current Experience;Profession;Time since onset of injury/illness/exacerbation    Comorbidities cervicalgia, anxiety, numbness and tingling, OSA, depression, lymphedema.    Examination-Activity Limitations Self  Feeding    Examination-Participation Restrictions Occupation;Other   eating   Stability/Clinical Decision Making Evolving/Moderate complexity    Rehab Potential Fair    PT Frequency 2x / week    PT Duration 8 weeks    PT Treatment/Interventions ADLs/Self Care Home Management;Cryotherapy;Moist Heat;Iontophoresis 4mg/ml Dexamethasone;Electrical Stimulation;Traction;Ultrasound;Therapeutic activities;Functional mobility training;Therapeutic exercise;Balance training;Neuromuscular re-education;Patient/family education;Manual techniques;Manual lymph drainage;Taping;Splinting;Energy conservation;Dry needling;Passive range of motion;Vasopneumatic Device;Vestibular;Visual/perceptual remediation/compensation;Canalith Repostioning    PT Next Visit Plan TDN, mobilization, cervical   alignment    Consulted and Agree with Plan of Care Patient               Marina Moser, PT, DPT  02/06/2022, 1:22 PM    

## 2022-02-07 ENCOUNTER — Ambulatory Visit: Payer: Managed Care, Other (non HMO)

## 2022-02-07 DIAGNOSIS — M542 Cervicalgia: Secondary | ICD-10-CM

## 2022-02-14 NOTE — Patient Instructions (Signed)
SURGICAL WAITING ROOM VISITATION Patients having surgery or a procedure may have no more than 2 support people in the waiting area - these visitors may rotate.   Children under the age of 56 must have an adult with them who is not the patient. If the patient needs to stay at the hospital during part of their recovery, the visitor guidelines for inpatient rooms apply. Pre-op nurse will coordinate an appropriate time for 1 support person to accompany patient in pre-op.  This support person may not rotate.    Please refer to the [facility] website for the visitor guidelines for Inpatients (after your surgery is over and you are in a regular room).    Your procedure is scheduled on: 02/19/22   Report to [facility] Main Entrance    Report to admitting at 2:15 PM   Call this number if you have problems the morning of surgery [phone]   Do not eat food :After Midnight.   After Midnight you may have the following liquids until 1:30 PM DAY OF SURGERY  Water Non-Citrus Juices (without pulp, NO RED) Carbonated Beverages Black Coffee (NO MILK/CREAM OR CREAMERS, sugar ok)  Clear Tea (NO MILK/CREAM OR CREAMERS, sugar ok) regular and decaf                             Plain Jell-O (NO RED)                                           Fruit ices (not with fruit pulp, NO RED)                                     Popsicles (NO RED)                                                               Sports drinks like Gatorade (NO RED)                 The day of surgery:  Drink ONE (1) Pre-Surgery Ensure at 1:30 PM the morning of surgery. Drink in one sitting. Do not sip.  This drink was given to you during your hospital  pre-op appointment visit. Nothing else to drink after completing the  Pre-Surgery Ensure.          If you have questions, please contact your surgeon's office.   FOLLOW BOWEL PREP AND ANY ADDITIONAL PRE OP INSTRUCTIONS YOU RECEIVED FROM YOUR SURGEON'S OFFICE!!!     Oral  Hygiene is also important to reduce your risk of infection.                                    Remember - BRUSH YOUR TEETH THE MORNING OF SURGERY WITH YOUR REGULAR TOOTHPASTE   Take these medicines the morning of surgery with A SIP OF WATER: Tylenol, Inhalers, Bupropion, Citalopram, Omeprazole, Zofran   Bring CPAP mask and tubing day of surgery.  You may not have any metal on your body including hair pins, jewelry, and body piercing             Do not wear make-up, lotions, powders, perfumes, or deodorant  Do not wear nail polish including gel and S&S, artificial/acrylic nails, or any other type of covering on natural nails including finger and toenails. If you have artificial nails, gel coating, etc. that needs to be removed by a nail salon please have this removed prior to surgery or surgery may need to be canceled/ delayed if the surgeon/ anesthesia feels like they are unable to be safely monitored.   Do not shave  48 hours prior to surgery.    Do not bring valuables to the hospital. [place].  DO NOT [place]. PHARMACY WILL DISPENSE MEDICATIONS LISTED ON YOUR MEDICATION LIST TO YOU DURING YOUR ADMISSION [place]!    Patients discharged on the day of surgery will not be allowed to drive home.  Someone NEEDS to stay with you for the first 24 hours after anesthesia.              Please read over the following fact sheets you were given: IF [place] [id number] Schneiders   If you received a COVID test during your pre-op visit  it is requested that you wear a mask when out in public, stay away from anyone that may not be feeling well and notify your surgeon if you develop symptoms. If you test positive for Covid or have been in contact with anyone that has tested positive in the last 10 days please notify you surgeon.      [place] - Preparing for Surgery Before surgery, you can play an important role.  Because skin is not sterile, your skin needs to be as free of germs as possible.  You can reduce the number of germs on your skin by washing with CHG (chlorahexidine gluconate) soap before surgery.  CHG is an antiseptic cleaner which kills germs and bonds with the skin to continue killing germs even after washing. Please DO NOT use if you have an allergy to CHG or antibacterial soaps.  If your skin becomes reddened/irritated stop using the CHG and inform your nurse when you arrive at Short Stay. Do not shave (including legs and underarms) for at least 48 hours prior to the first CHG shower.  You may shave your face/neck.  Please follow these instructions carefully:  1.  Shower with CHG Soap the night before surgery and the  morning of surgery.  2.  If you choose to wash your hair, wash your hair first as usual with your normal  shampoo.  3.  After you shampoo, rinse your hair and body thoroughly to remove the shampoo.                             4.  Use CHG as you would any other liquid soap.  You can apply chg directly to the skin and wash.  Gently with a scrungie or clean washcloth.  5.  Apply the CHG Soap to your body ONLY FROM THE NECK DOWN.   Do   not use on face/ open  Wound or open sores. Avoid contact with eyes, ears mouth and   genitals (private parts).                       Wash face,  Genitals (private parts) with your normal soap.             6.  Wash thoroughly, paying special attention to the area where your    surgery  will be performed.  7.  Thoroughly rinse your body with warm water from the neck down.  8.  DO NOT shower/wash with your normal soap after using and rinsing off the CHG Soap.                9.  Pat yourself dry with a clean towel.            10.  Wear clean pajamas.            11.  Place clean sheets on your bed the night of your first shower and do not  sleep  with pets. Day of Surgery : Do not apply any lotions/deodorants the morning of surgery.  Please wear clean clothes to the hospital/surgery center.  FAILURE TO FOLLOW THESE INSTRUCTIONS MAY RESULT IN THE CANCELLATION OF YOUR SURGERY  PATIENT SIGNATURE_________________________________  NURSE SIGNATURE__________________________________  ________________________________________________________________________   Adam Phenix  An incentive spirometer is a tool that can help keep your lungs clear and active. This tool measures how well you are filling your lungs with each breath. Taking long deep breaths may help reverse or decrease the chance of developing breathing (pulmonary) problems (especially infection) following: A long period of time when you are unable to move or be active. BEFORE THE PROCEDURE  If the spirometer includes an indicator to show your best effort, your nurse or respiratory therapist will set it to a desired goal. If possible, sit up straight or lean slightly forward. Try not to slouch. Hold the incentive spirometer in an upright position. INSTRUCTIONS FOR USE  Sit on the edge of your bed if possible, or sit up as far as you can in bed or on a chair. Hold the incentive spirometer in an upright position. Breathe out normally. Place the mouthpiece in your mouth and seal your lips tightly around it. Breathe in slowly and as deeply as possible, raising the piston or the ball toward the top of the column. Hold your breath for 3-5 seconds or for as long as possible. Allow the piston or ball to fall to the bottom of the column. Remove the mouthpiece from your mouth and breathe out normally. Rest for a few seconds and repeat Steps 1 through 7 at least 10 times every 1-2 hours when you are awake. Take your time and take a few normal breaths between deep breaths. The spirometer may include an indicator to show your best effort. Use the indicator as a goal to work toward  during each repetition. After each set of 10 deep breaths, practice coughing to be sure your lungs are clear. If you have an incision (the cut made at the time of surgery), support your incision when coughing by placing a pillow or rolled up towels firmly against it. Once you are able to get out of bed, walk around indoors and cough well. You may stop using the incentive spirometer when instructed by your caregiver.  RISKS AND COMPLICATIONS Take your time so you do not get dizzy or light-headed. If you are in pain, you  may need to take or ask for pain medication before doing incentive spirometry. It is harder to take a deep breath if you are having pain. AFTER USE Rest and breathe slowly and easily. It can be helpful to keep track of a log of your progress. Your caregiver can provide you with a simple table to help with this. If you are using the spirometer at home, follow these instructions: [place] IF:  You are having difficultly using the spirometer. You have trouble using the spirometer as often as instructed. Your pain medication is not giving enough relief while using the spirometer. You develop fever of 100.5 F (38.1 C) or higher. SEEK IMMEDIATE MEDICAL CARE IF:  You cough up bloody sputum that had not been present before. You develop fever of 102 F (38.9 C) or greater. You develop worsening pain at or near the incision site. MAKE SURE YOU:  Understand these instructions. Will watch your condition. Will get help right away if you are not doing well or get worse. Document Released: 08/27/2006 Document Revised: 07/09/2011 Document Reviewed: 10/28/2006 [facility] Patient Information 2014 [place], [place].   ________________________________________________________________________

## 2022-02-14 NOTE — Progress Notes (Addendum)
COVID Vaccine Completed:  Date of COVID positive in last 90 days:  PCP - Lennie Odor, PA Cardiologist -   Chest x-ray -  EKG -  Stress Test -  ECHO -  Cardiac Cath -  Pacemaker/ICD device last checked: Spinal Cord Stimulator:  Bowel Prep -   Sleep Study -  CPAP -   Fasting Blood Sugar -  Checks Blood Sugar _____ times a day  Blood Thinner Instructions: Aspirin Instructions: Last Dose:  Activity level:  Can go up a flight of stairs and perform activities of daily living without stopping and without symptoms of chest pain or shortness of breath.  Able to exercise without symptoms  Unable to go up a flight of stairs without symptoms of     Anesthesia review:   Patient denies shortness of breath, fever, cough and chest pain at PAT appointment  Patient verbalized understanding of instructions that were given to them at the PAT appointment. Patient was also instructed that they will need to review over the PAT instructions again at home before surgery.

## 2022-02-15 ENCOUNTER — Encounter (HOSPITAL_COMMUNITY)
Admission: RE | Admit: 2022-02-15 | Discharge: 2022-02-15 | Disposition: A | Discharge status: Home / Self Care | Payer: Managed Care, Other (non HMO) | Source: Ambulatory Visit | Attending: Orthopedic Surgery | Admitting: Orthopedic Surgery

## 2022-02-15 ENCOUNTER — Encounter (HOSPITAL_COMMUNITY): Payer: Self-pay

## 2022-02-15 VITALS — BP 145/84 | HR 69 | Temp 98.3°F | Resp 12 | Ht 59.0 in | Wt 275.0 lb

## 2022-02-15 DIAGNOSIS — Z01818 Encounter for other preprocedural examination: Secondary | ICD-10-CM | POA: Insufficient documentation

## 2022-02-15 DIAGNOSIS — E119 Type 2 diabetes mellitus without complications: Secondary | ICD-10-CM | POA: Diagnosis not present

## 2022-02-15 HISTORY — DX: Anxiety disorder, unspecified: F41.9

## 2022-02-15 HISTORY — DX: Unspecified osteoarthritis, unspecified site: M19.90

## 2022-02-15 HISTORY — DX: Unspecified asthma, uncomplicated: J45.909

## 2022-02-15 HISTORY — DX: Depression, unspecified: F32.A

## 2022-02-15 LAB — BASIC METABOLIC PANEL
Anion gap: 8 (ref 5–15)
BUN: 13 mg/dL (ref 6–20)
CO2: 29 mmol/L (ref 22–32)
Calcium: 9.6 mg/dL (ref 8.9–10.3)
Chloride: 104 mmol/L (ref 98–111)
Creatinine, Ser: 0.77 mg/dL (ref 0.44–1.00)
GFR, Estimated: >60 mL/min (ref >60)
Glucose, Bld: 91 mg/dL (ref 70–99)
Potassium: 4.2 mmol/L (ref 3.5–5.1)
Sodium: 141 mmol/L (ref 135–145)

## 2022-02-15 LAB — CBC
HCT: 40.8 % (ref 36.0–46.0)
Hemoglobin: 12.9 g/dL (ref 12.0–15.0)
MCH: 29.2 pg (ref 26.0–34.0)
MCHC: 31.6 g/dL (ref 30.0–36.0)
MCV: 92.3 fL (ref 80.0–100.0)
Platelets: 366 10*3/uL (ref 150–400)
RBC: 4.42 MIL/uL (ref 3.87–5.11)
RDW: 14.9 % (ref 11.5–15.5)
WBC: 9.2 10*3/uL (ref 4.0–10.5)
nRBC: 0 % (ref 0.0–0.2)

## 2022-02-15 LAB — HEMOGLOBIN A1C
Hgb A1c MFr Bld: 5.5 % (ref 4.8–5.6)
Mean Plasma Glucose: 111.15 mg/dL

## 2022-02-18 NOTE — Anesthesia Preprocedure Evaluation (Signed)
Anesthesia Evaluation  Patient identified by MRN, date of birth, ID band Patient awake    Reviewed: Allergy & Precautions, NPO status , Patient's Chart, lab work & pertinent test results  Airway Mallampati: III  TM Distance: >3 FB Neck ROM: Full    Dental no notable dental hx. (+) Teeth Intact, Dental Advisory Given   Pulmonary sleep apnea and Continuous Positive Airway Pressure Ventilation ,    Pulmonary exam normal breath sounds clear to auscultation       Cardiovascular hypertension, Normal cardiovascular exam Rhythm:Regular Rate:Normal     Neuro/Psych    GI/Hepatic GERD  Medicated,  Endo/Other  diabetesMorbid obesity (BMI 55.5)  Renal/GU Lab Results      Component                Value               Date                       K                        4.2                 02/15/2022                    Musculoskeletal  (+) Arthritis ,   Abdominal   Peds  Hematology Lab Results      Component                Value               Date                            HGB                      12.9                02/15/2022                HCT                      40.8                02/15/2022                PLT                      366                 02/15/2022              Anesthesia Other Findings   Reproductive/Obstetrics                           Anesthesia Physical Anesthesia Plan  ASA: 3  Anesthesia Plan: General   Post-op Pain Management: Regional block*, Ketamine IV* and Tylenol PO (pre-op)*   Induction: Intravenous  PONV Risk Score and Plan: 4 or greater and Treatment may vary due to age or medical condition and Ondansetron  Airway Management Planned: LMA  Additional Equipment: None  Intra-op Plan:   Post-operative Plan: Extubation in OR  Informed Consent: I have reviewed the patients History and Physical, chart, labs and discussed the procedure including the risks, benefits  and  alternatives for the proposed anesthesia with the patient or authorized representative who has indicated his/her understanding and acceptance.     Dental advisory given  Plan Discussed with:   Anesthesia Plan Comments: (GA LMA +/- L adductor canal block)       Anesthesia Quick Evaluation

## 2022-02-19 ENCOUNTER — Ambulatory Visit (HOSPITAL_BASED_OUTPATIENT_CLINIC_OR_DEPARTMENT_OTHER): Payer: Managed Care, Other (non HMO) | Admitting: Anesthesiology

## 2022-02-19 ENCOUNTER — Ambulatory Visit (HOSPITAL_COMMUNITY): Payer: Managed Care, Other (non HMO)

## 2022-02-19 ENCOUNTER — Other Ambulatory Visit: Payer: Self-pay

## 2022-02-19 ENCOUNTER — Ambulatory Visit (HOSPITAL_COMMUNITY): Payer: Managed Care, Other (non HMO) | Admitting: Anesthesiology

## 2022-02-19 ENCOUNTER — Ambulatory Visit (HOSPITAL_COMMUNITY)
Admission: RE | Admit: 2022-02-19 | Discharge: 2022-02-19 | Disposition: A | Discharge status: Home / Self Care | Payer: Managed Care, Other (non HMO) | Attending: Orthopedic Surgery | Admitting: Orthopedic Surgery

## 2022-02-19 ENCOUNTER — Encounter (HOSPITAL_COMMUNITY): Payer: Self-pay | Admitting: Orthopedic Surgery

## 2022-02-19 ENCOUNTER — Encounter (HOSPITAL_COMMUNITY)
Admission: RE | Disposition: A | Discharge status: Home / Self Care | Payer: Self-pay | Source: Home / Self Care | Attending: Orthopedic Surgery

## 2022-02-19 DIAGNOSIS — I10 Essential (primary) hypertension: Secondary | ICD-10-CM | POA: Diagnosis not present

## 2022-02-19 DIAGNOSIS — E119 Type 2 diabetes mellitus without complications: Secondary | ICD-10-CM

## 2022-02-19 DIAGNOSIS — Z9989 Dependence on other enabling machines and devices: Secondary | ICD-10-CM

## 2022-02-19 DIAGNOSIS — M2419 Other articular cartilage disorders, other specified site: Secondary | ICD-10-CM | POA: Diagnosis not present

## 2022-02-19 DIAGNOSIS — M94262 Chondromalacia, left knee: Secondary | ICD-10-CM | POA: Insufficient documentation

## 2022-02-19 DIAGNOSIS — K219 Gastro-esophageal reflux disease without esophagitis: Secondary | ICD-10-CM | POA: Diagnosis not present

## 2022-02-19 DIAGNOSIS — Z6841 Body Mass Index (BMI) 40.0 and over, adult: Secondary | ICD-10-CM | POA: Diagnosis not present

## 2022-02-19 DIAGNOSIS — G4733 Obstructive sleep apnea (adult) (pediatric): Secondary | ICD-10-CM | POA: Insufficient documentation

## 2022-02-19 HISTORY — PX: KNEE ARTHROSCOPY: SHX127

## 2022-02-19 LAB — GLUCOSE, CAPILLARY: Glucose-Capillary: 120 mg/dL — ABNORMAL HIGH (ref 70–99)

## 2022-02-19 SURGERY — ARTHROSCOPY, KNEE
Anesthesia: General | Site: Knee | Laterality: Left

## 2022-02-19 MED ORDER — EPINEPHRINE PF 1 MG/ML IJ SOLN
INTRAMUSCULAR | Status: AC
  Filled 2022-02-19: qty 1

## 2022-02-19 MED ORDER — MIDAZOLAM HCL 2 MG/2ML IJ SOLN
INTRAMUSCULAR | Status: AC
  Filled 2022-02-19: qty 2

## 2022-02-19 MED ORDER — CEFAZOLIN IN SODIUM CHLORIDE 3-0.9 GM/100ML-% IV SOLN
3.0000 g | INTRAVENOUS | Status: AC
  Administered 2022-02-19: 3 g via INTRAVENOUS
  Filled 2022-02-19: qty 100

## 2022-02-19 MED ORDER — AMISULPRIDE (ANTIEMETIC) 5 MG/2ML IV SOLN: 10.0000 mg | Freq: Once | INTRAVENOUS | Status: DC | PRN

## 2022-02-19 MED ORDER — HYDROMORPHONE HCL 1 MG/ML IJ SOLN
INTRAMUSCULAR | Status: AC
  Filled 2022-02-19: qty 1

## 2022-02-19 MED ORDER — PROPOFOL 1000 MG/100ML IV EMUL
INTRAVENOUS | Status: AC
  Filled 2022-02-19: qty 100

## 2022-02-19 MED ORDER — ACETAMINOPHEN 10 MG/ML IV SOLN
1000.0000 mg | Freq: Once | INTRAVENOUS | Status: DC | PRN
  Administered 2022-02-19: 1000 mg via INTRAVENOUS

## 2022-02-19 MED ORDER — DEXMEDETOMIDINE HCL IN NACL 80 MCG/20ML IV SOLN
INTRAVENOUS | Status: DC | PRN
  Administered 2022-02-19: 12 ug via BUCCAL

## 2022-02-19 MED ORDER — ORAL CARE MOUTH RINSE
15.0000 mL | Freq: Once | OROMUCOSAL | Status: AC
  Administered 2022-02-19: 15 mL via OROMUCOSAL

## 2022-02-19 MED ORDER — ONDANSETRON HCL 4 MG/2ML IJ SOLN: 4.0000 mg | Freq: Once | INTRAMUSCULAR | Status: DC | PRN

## 2022-02-19 MED ORDER — OXYCODONE HCL 5 MG PO TABS
5.0000 mg | ORAL_TABLET | Freq: Once | ORAL | Status: AC | PRN
  Administered 2022-02-19: 5 mg via ORAL

## 2022-02-19 MED ORDER — OXYCODONE HCL 5 MG PO TABS
ORAL_TABLET | ORAL | Status: AC
  Filled 2022-02-19: qty 1

## 2022-02-19 MED ORDER — ONDANSETRON HCL 4 MG/2ML IJ SOLN
INTRAMUSCULAR | Status: AC
  Filled 2022-02-19: qty 2

## 2022-02-19 MED ORDER — FENTANYL CITRATE (PF) 100 MCG/2ML IJ SOLN
INTRAMUSCULAR | Status: AC
  Filled 2022-02-19: qty 2

## 2022-02-19 MED ORDER — MIDAZOLAM HCL 2 MG/2ML IJ SOLN
2.0000 mg | INTRAMUSCULAR | Status: DC
  Filled 2022-02-19: qty 2

## 2022-02-19 MED ORDER — HYDROCODONE-ACETAMINOPHEN 5-325 MG PO TABS: 1.0000 | ORAL_TABLET | Freq: Four times a day (QID) | ORAL | 0 refills | Status: DC | PRN

## 2022-02-19 MED ORDER — DEXAMETHASONE SODIUM PHOSPHATE 10 MG/ML IJ SOLN
INTRAMUSCULAR | Status: AC
  Filled 2022-02-19: qty 1

## 2022-02-19 MED ORDER — LIDOCAINE HCL (PF) 2 % IJ SOLN
INTRAMUSCULAR | Status: AC
  Filled 2022-02-19: qty 5

## 2022-02-19 MED ORDER — BUPIVACAINE HCL (PF) 0.5 % IJ SOLN
INTRAMUSCULAR | Status: AC
  Filled 2022-02-19: qty 30

## 2022-02-19 MED ORDER — PROPOFOL 10 MG/ML IV BOLUS
INTRAVENOUS | Status: DC | PRN
  Administered 2022-02-19: 180 mg via INTRAVENOUS
  Administered 2022-02-19: 20 mg via INTRAVENOUS

## 2022-02-19 MED ORDER — CHLORHEXIDINE GLUCONATE 0.12 % MT SOLN: 15.0000 mL | Freq: Once | OROMUCOSAL | Status: AC

## 2022-02-19 MED ORDER — LACTATED RINGERS IV SOLN: INTRAVENOUS | Status: DC

## 2022-02-19 MED ORDER — PROPOFOL 10 MG/ML IV BOLUS
INTRAVENOUS | Status: AC
  Filled 2022-02-19: qty 20

## 2022-02-19 MED ORDER — ACETAMINOPHEN 10 MG/ML IV SOLN
INTRAVENOUS | Status: AC
  Filled 2022-02-19: qty 100

## 2022-02-19 MED ORDER — HYDROMORPHONE HCL 1 MG/ML IJ SOLN
0.2500 mg | INTRAMUSCULAR | Status: DC | PRN
  Administered 2022-02-19 (×4): 0.5 mg via INTRAVENOUS

## 2022-02-19 MED ORDER — FENTANYL CITRATE PF 50 MCG/ML IJ SOSY
100.0000 ug | PREFILLED_SYRINGE | INTRAMUSCULAR | Status: DC
  Filled 2022-02-19: qty 2

## 2022-02-19 MED ORDER — SODIUM CHLORIDE 0.9 % IR SOLN
Status: DC | PRN
  Administered 2022-02-19: 3000 mL

## 2022-02-19 MED ORDER — MIDAZOLAM HCL 5 MG/5ML IJ SOLN
INTRAMUSCULAR | Status: DC | PRN
  Administered 2022-02-19 (×2): 1 mg via INTRAVENOUS

## 2022-02-19 MED ORDER — EPINEPHRINE PF 1 MG/ML IJ SOLN
INTRAMUSCULAR | Status: DC | PRN
  Administered 2022-02-19: 1 mg

## 2022-02-19 MED ORDER — ONDANSETRON HCL 4 MG/2ML IJ SOLN
INTRAMUSCULAR | Status: DC | PRN
  Administered 2022-02-19: 4 mg via INTRAVENOUS

## 2022-02-19 MED ORDER — DEXAMETHASONE SODIUM PHOSPHATE 10 MG/ML IJ SOLN
INTRAMUSCULAR | Status: DC | PRN
  Administered 2022-02-19: 5 mg via INTRAVENOUS

## 2022-02-19 MED ORDER — PROPOFOL 500 MG/50ML IV EMUL
INTRAVENOUS | Status: DC | PRN
  Administered 2022-02-19: 20 ug/kg/min via INTRAVENOUS

## 2022-02-19 MED ORDER — OXYCODONE HCL 5 MG/5ML PO SOLN: 5.0000 mg | Freq: Once | ORAL | Status: AC | PRN

## 2022-02-19 MED ORDER — FENTANYL CITRATE (PF) 100 MCG/2ML IJ SOLN
INTRAMUSCULAR | Status: DC | PRN
  Administered 2022-02-19: 25 ug via INTRAVENOUS
  Administered 2022-02-19 (×2): 50 ug via INTRAVENOUS
  Administered 2022-02-19: 25 ug via INTRAVENOUS
  Administered 2022-02-19: 50 ug via INTRAVENOUS

## 2022-02-19 MED ORDER — LIDOCAINE HCL (CARDIAC) PF 100 MG/5ML IV SOSY
PREFILLED_SYRINGE | INTRAVENOUS | Status: DC | PRN
  Administered 2022-02-19: 50 mg via INTRAVENOUS

## 2022-02-19 SURGICAL SUPPLY — 30 items
BAG COUNTER SPONGE SURGICOUNT (BAG) IMPLANT
BLADE EXCALIBUR 4.0X13 (MISCELLANEOUS) IMPLANT
BNDG ELASTIC 3X5.8 VLCR STR LF (GAUZE/BANDAGES/DRESSINGS) IMPLANT
BNDG ELASTIC 6X5.8 VLCR STR LF (GAUZE/BANDAGES/DRESSINGS) ×1 IMPLANT
BOOTIES KNEE HIGH SLOAN (MISCELLANEOUS) ×2 IMPLANT
DISSECTOR 4.0MM X 13CM (MISCELLANEOUS) IMPLANT
DRAPE U-SHAPE 47X51 STRL (DRAPES) ×1 IMPLANT
DRSG EMULSION OIL 3X3 NADH (GAUZE/BANDAGES/DRESSINGS) ×1 IMPLANT
DURAPREP 26ML APPLICATOR (WOUND CARE) ×1 IMPLANT
DW OUTFLOW CASSETTE/TUBE SET (MISCELLANEOUS) ×1 IMPLANT
FILTER STRAW (MISCELLANEOUS) ×1 IMPLANT
GAUZE 4X4 16PLY ~~LOC~~+RFID DBL (SPONGE) ×1 IMPLANT
GAUZE SPONGE 4X4 12PLY STRL (GAUZE/BANDAGES/DRESSINGS) ×1 IMPLANT
GAUZE XEROFORM 1X8 LF (GAUZE/BANDAGES/DRESSINGS) IMPLANT
GLOVE BIOGEL PI IND STRL 8 (GLOVE) ×2 IMPLANT
GLOVE ECLIPSE 7.5 STRL STRAW (GLOVE) ×2 IMPLANT
GOWN STRL REUS W/ TWL XL LVL3 (GOWN DISPOSABLE) ×2 IMPLANT
GOWN STRL REUS W/TWL XL LVL3 (GOWN DISPOSABLE) ×2
KIT BASIN OR (CUSTOM PROCEDURE TRAY) ×1 IMPLANT
MANIFOLD NEPTUNE II (INSTRUMENTS) ×1 IMPLANT
NDL SAFETY ECLIP 18X1.5 (MISCELLANEOUS) ×1 IMPLANT
PACK ARTHROSCOPY WL (CUSTOM PROCEDURE TRAY) ×1 IMPLANT
PAD MASON LEG HOLDER (PIN) IMPLANT
PADDING CAST ABS COTTON 6X4 NS (CAST SUPPLIES) IMPLANT
PADDING CAST COTTON 6X4 STRL (CAST SUPPLIES) ×1 IMPLANT
PENCIL SMOKE EVACUATOR (MISCELLANEOUS) IMPLANT
PORT APPOLLO RF 90DEGREE MULTI (SURGICAL WAND) IMPLANT
SUT ETHILON 4 0 PS 2 18 (SUTURE) IMPLANT
TUBING ARTHROSCOPY IRRIG 16FT (MISCELLANEOUS) ×1 IMPLANT
WRAP KNEE MAXI GEL POST OP (GAUZE/BANDAGES/DRESSINGS) ×1 IMPLANT

## 2022-02-19 NOTE — Transfer of Care (Signed)
Immediate Anesthesia Transfer of Care Note  Patient: Danielle Lin  Procedure(s) Performed: ARTHROSCOPY KNEE CHONDRAL DEBRIDEMENT MEDIAL AND LATERAL COMPARTMENT (Left: Knee)  Patient Location: PACU  Anesthesia Type:General  Level of Consciousness: awake, alert , oriented and patient cooperative  Airway & Oxygen Therapy: Patient Spontanous Breathing and Patient connected to face mask oxygen  Post-op Assessment: Report given to RN and Post -op Vital signs reviewed and stable  Post vital signs: Reviewed and stable  Last Vitals:  Vitals Value Taken Time  BP 144/88 02/19/22 1523  Temp    Pulse 79 02/19/22 1527  Resp 17 02/19/22 1527  SpO2 100 % 02/19/22 1527  Vitals shown include unvalidated device data.  Last Pain:  Vitals:   02/19/22 1030  TempSrc: Oral         Complications: No notable events documented.

## 2022-02-19 NOTE — Anesthesia Procedure Notes (Signed)
Procedure Name: LMA Insertion Date/Time: 02/19/2022 2:22 PM  Performed by: Garrel Ridgel, CRNAPre-anesthesia Checklist: Patient identified, Emergency Drugs available, Suction available and Patient being monitored Patient Re-evaluated:Patient Re-evaluated prior to induction Oxygen Delivery Method: Circle system utilized Preoxygenation: Pre-oxygenation with 100% oxygen Induction Type: IV induction Ventilation: Mask ventilation without difficulty LMA: LMA with gastric port inserted LMA Size: 4.0 Tube type: Oral Number of attempts: 1 Placement Confirmation: positive ETCO2 and breath sounds checked- equal and bilateral Tube secured with: Tape Dental Injury: Teeth and Oropharynx as per pre-operative assessment

## 2022-02-19 NOTE — H&P (Signed)
A pre op hand p   Chief Complaint: Left knee pain  HPI: Danielle Lin is a 46 y.o. female who presents for evaluation of left knee pain. It has been present for rater than 3 months and she has pain with every step and has been worsening. She has failed conservative measures. Pain is rated as moderate.  Past Medical History:  Diagnosis Date   Anxiety    Arthritis    Asthma    Depression    Diabetes mellitus type 2, uncomplicated ([place])    pt reports pre DM before weight lose surgery   GERD without esophagitis    Heavy menses    Hypertension    IDA (iron deficiency anemia)    Obstructive sleep apnea on CPAP    Past Surgical History:  Procedure Laterality Date   arm surgery Left    BARIATRIC SURGERY     CARPAL TUNNEL RELEASE Bilateral    CHOLECYSTECTOMY     GASTROPLASTY DUODENAL SWITCH     Social History   Socioeconomic History   Marital status: Married    Spouse name: Not on file   Number of children: Not on file   Years of education: Not on file   Highest education level: Not on file  Occupational History   Not on file  Tobacco Use   Smoking status: Never   Smokeless tobacco: Never  Vaping Use   Vaping Use: Never used  Substance and Sexual Activity   Alcohol use: No   Drug use: No   Sexual activity: Not on file  Other Topics Concern   Not on file  Social History Narrative   Lives in [place]; works at [employer]. No smoking; no alcohol.    Social Determinants of Health   Financial Resource Strain: Not on file  Food Insecurity: Not on file  Transportation Needs: Not on file  Physical Activity: Not on file  Stress: Not on file  Social Connections: Not on file   Family History  Problem Relation Age of Onset   Bladder Cancer Mother    Uterine cancer Mother    Breast cancer Maternal Grandmother    Allergies  Allergen Reactions   Tetanus Toxoid Rash   Tetanus-Diphtheria Toxoids Td Rash    Raised rash and hardness at injection site   Prior to  Admission medications   Medication Sig Start Date End Date Taking? Authorizing Provider  acetaminophen (TYLENOL) 500 MG tablet Take 1,000 mg by mouth every 6 (six) hours as needed for moderate pain.   Yes [provider]  albuterol (VENTOLIN HFA) 108 (90 Base) MCG/ACT inhaler Inhale 1 puff into the lungs every 6 (six) hours as needed for wheezing or shortness of breath.   Yes [provider]  buPROPion (WELLBUTRIN XL) 300 MG 24 hr tablet Take 300 mg by mouth daily. 07/27/18  Yes [provider]  CALCIUM-VITAMIN D PO Take 2 tablets by mouth daily.   Yes [provider]  citalopram (CELEXA) 40 MG tablet Take 40 mg by mouth daily.   Yes [provider]  Cyanocobalamin (B-12 SL) Place 1 tablet under the tongue daily.   Yes [provider]  HYDROcodone-acetaminophen (NORCO/VICODIN) 5-325 MG tablet Take 1 tablet by mouth at bedtime.   Yes [provider]  hydrOXYzine (ATARAX) 25 MG tablet Take 25-50 mg by mouth daily. 06/09/21  Yes [provider]  levonorgestrel (LILETTA) 19.5 MCG/DAY IUD IUD 1 each by Intrauterine route once. 09/08/18 09/07/24 Yes [provider]  Multiple  Vitamin (MULTI-VITAMIN) tablet Take 1 tablet by mouth daily.   Yes [provider]  NON FORMULARY Pt uses a cpap nightly   Yes [provider]  omeprazole (PRILOSEC) 20 MG capsule Take 20 mg by mouth 2 (two) times daily before a meal.   Yes [provider]  ondansetron (ZOFRAN) 4 MG tablet Take 4 mg by mouth every 8 (eight) hours as needed for vomiting or nausea. 07/22/15  Yes [provider]  cetirizine (ZYRTEC ALLERGY) 10 MG tablet Take 1 tablet (10 mg total) by mouth daily. Patient not taking: Reported on 02/13/2022 01/19/22   Jaynee Eagles, PA-C  ibuprofen (ADVIL) 600 MG tablet Take 1 tablet (600 mg total) by mouth every 6 (six) hours as needed. Patient not taking: Reported on 02/13/2022 01/19/22   Jaynee Eagles, PA-C   Iron-Vit C-Vit B12-Folic Acid [phone] MG TABS Take 1 tablet by mouth daily. Patient not taking: Reported on 02/13/2022 07/11/18   Carrie Mew, MD  predniSONE (DELTASONE) 50 MG tablet Take 1 tablet (50 mg total) by mouth daily with breakfast. Patient not taking: Reported on 02/13/2022 01/19/22   Jaynee Eagles, PA-C  pseudoephedrine (SUDAFED) 60 MG tablet Take 1 tablet (60 mg total) by mouth every 8 (eight) hours as needed for congestion. Patient not taking: Reported on 02/13/2022 01/19/22   Jaynee Eagles, PA-C     Positive ROS: None  All other systems have been reviewed and were otherwise negative with the exception of those mentioned in the HPI and as above.  Physical Exam: There were no vitals filed for this visit.  General: Alert, no acute distress Cardiovascular: No pedal edema Respiratory: No cyanosis, no use of accessory musculature GI: No organomegaly, abdomen is soft and non-tender Skin: No lesions in the area of chief complaint Neurologic: Sensation intact distally Psychiatric: Patient is competent for consent with normal mood and affect Lymphatic: No axillary or cervical lymphadenopathy  MUSCULOSKELETAL: Left knee: Painful range of motion.  Limited range of motion.  Trace effusion.  No instability.  Assessment/Plan: CHONDRAL EDEMA LEFT FEMUR AND TIBIA POSSIBLE MEDIAL MENISCAL TEAR Plan for Procedure(s): ARTHROSCOPY KNEE WITH FEMORAL AND TIBIAL SUBCHONDROPLASTY  The risks benefits and alternatives were discussed with the patient including but not limited to the risks of nonoperative treatment, versus surgical intervention including infection, bleeding, nerve injury, malunion, nonunion, hardware prominence, hardware failure, need for hardware removal, blood clots, cardiopulmonary complications, morbidity, mortality, among others, and they were willing to proceed.  Predicted outcome is good, although there will be at least a six to nine month expected recovery.  Alta Corning, MD 02/19/2022 7:24 AM

## 2022-02-19 NOTE — Brief Op Note (Signed)
02/19/2022  3:45 PM  PATIENT:  Danielle Lin  46 y.o. female  PRE-OPERATIVE DIAGNOSIS:  CHONDRAL EDEMA LEFT FEMUR AND TIBIA POSSIBLE MEDIAL MENISCAL TEAR  POST-OPERATIVE DIAGNOSIS:  CHONDRAL EDEMA LEFT FEMUR AND TIBIA POSSIBLE MEDIAL MENISCAL TEAR  PROCEDURE:  Procedure(s): ARTHROSCOPY KNEE CHONDRAL DEBRIDEMENT MEDIAL AND LATERAL COMPARTMENT (Left)  SURGEON:  Surgeon(s) and Role:    Dorna Leitz, MD - Primary  PHYSICIAN ASSISTANT:   ASSISTANTS: Joanell Rising  ANESTHESIA:   General  EBL:  2 mL   BLOOD ADMINISTERED:none  DRAINS: none   LOCAL MEDICATIONS USED:  MARCAINE     SPECIMEN:  No Specimen  DISPOSITION OF SPECIMEN:  N/A  COUNTS:  YES  TOURNIQUET:  * No tourniquets in log *  DICTATION: .Other Dictation: Dictation Number [id number]  PLAN OF CARE: Discharge to home after PACU  PATIENT DISPOSITION:  PACU - hemodynamically stable.   Delay start of Pharmacological VTE agent (>24hrs) due to surgical blood loss or risk of bleeding: no

## 2022-02-19 NOTE — Op Note (Signed)
Danielle Lin, MAHONE MEDICAL RECORD NO: [id number] ACCOUNT NO: [id number] DATE OF BIRTH: 1976/04/25 FACILITY: [facility] LOCATION: [facility] PHYSICIAN: Alta Corning, MD  Operative Report   DATE OF PROCEDURE: 02/19/2022  PREOPERATIVE DIAGNOSIS: Painful left knee with MRI showing no meniscal injury, but concern for chondral injury and concern for edema in the bone.  POSTOPERATIVE DIAGNOSES: 1.  Chondromalacia of medial femoral condyle. 2.  Chondromalacia of medial tibial plateau. 3.  Chondromalacia of lateral tibial plateau.  PROCEDURES: 1.  Left knee arthroscopy with chondroplasty, medial femoral condyle down to bleeding bone where necessary. 2.  Chondroplasty of medial tibial plateau down to bleeding bone where necessary. 3.  Chondroplasty of lateral tibial plateau down to bleeding bone where necessary.  SURGEON:  Alta Corning, MD  ASSISTANT:  Joanell Rising, PA-C, who was present for the entire case and assisted by manipulation of the leg, retraction of tissues and closing to minimize OR time.  BRIEF HISTORY:  The patient is a 46 year old female with a history of having had significant pain in the left knee.  She has been treated conservatively for a prolonged period of time and ultimately MRI was obtained, which showed that she had significant  edema within the medial femoral condyle and edema within the medial tibial plateau.  She was treated with conservative care for a prolonged time.  MRI did not show meniscal pathology and did not really show significant chondral pathology.  Because of  the severe nature of her pain and because of the MRI showing edema in the bone on the femur and the tibial side and it did not really look like the arthritic type edema that we see, I felt that taken to the operating room for operative arthroscopy and  consideration of a subchondroplasty was appropriate.  She was brought to the operating room for this procedure.  PROCEDURE:  The patient was brought  to the operating room.  After adequate anesthesia was obtained with general anesthetic, the patient was placed supine on the operating table.  Left leg was prepped and draped in the usual sterile fashion.  Following  this, routine arthroscopic examination of knee revealed that there was mild chondromalacia of the patellofemoral joint.  There was a large medial plica and we debrided this to gain access into the medial compartment.  Once in the medial compartment  unfortunately, she had a fairly significant chondral issue medially and the area of the weightbearing portion of the medial femoral condyle.  This was grade III and some grade IV in nature and this was debrided back to a smooth and stable rim.  She Had a significant area  laterally on the medial femoral condyle had some significant chondromalacia as well and this was debrided with a suction shaver.  Attention was turned to the tibial plateau, which did have some significant degenerative change over that medial aspect.   The medial meniscus was within normal limits.  Attention was turned to the notch where the ACL was normal.  Attention turned laterally where the lateral meniscus was within normal limits, but the lateral tibial plateau anteriorly had significant chondral  injury and this was debrided back to a smooth and stable rim.  Once that was completed, attention was turned towards taking a final look around the knee.  I felt like given the findings at arthroscopy I did not think the subchondroplasty was necessary  or appropriate, at that point, the knee was copiously irrigated and suctioned dry.  The arthroscopic portals were closed with  a bandage.  20 mL of 0.25% Marcaine was instilled in the knee for postoperative anesthesia.  The patient was taken after sterile  compressive dressing was applied to the recovery room once she noted to be in satisfactory condition.  Estimated blood loss for procedure was minimal.   PUS D: 02/19/2022 3:45:37  pm T: 02/19/2022 4:28:00 pm  JOB: [id number]/ [id number]

## 2022-02-19 NOTE — Progress Notes (Signed)
TOC contacted by PACU with request for bariatric walker. Referral called to [facility] with [employer], agency to deliver equipment to bedside.

## 2022-02-20 ENCOUNTER — Encounter (HOSPITAL_COMMUNITY): Payer: Self-pay | Admitting: Orthopedic Surgery

## 2022-02-20 NOTE — Anesthesia Postprocedure Evaluation (Signed)
Anesthesia Post Note  Patient: Danielle Lin  Procedure(s) Performed: ARTHROSCOPY KNEE CHONDRAL DEBRIDEMENT MEDIAL AND LATERAL COMPARTMENT (Left: Knee)     Patient location during evaluation: PACU Anesthesia Type: General Level of consciousness: awake and alert Pain management: pain level controlled Vital Signs Assessment: post-procedure vital signs reviewed and stable Respiratory status: spontaneous breathing, nonlabored ventilation and respiratory function stable Cardiovascular status: blood pressure returned to baseline and stable Postop Assessment: no apparent nausea or vomiting Anesthetic complications: no   No notable events documented.  Last Vitals:  Vitals:   02/19/22 1700 02/19/22 1715  BP: (!) 118/55 136/60  Pulse: 68 82  Resp: 15 14  Temp:  36.7 C  SpO2: 95% 96%    Last Pain:  Vitals:   02/19/22 1715  TempSrc:   PainSc: Towanda Ota [place]

## 2022-02-26 ENCOUNTER — Encounter (INDEPENDENT_AMBULATORY_CARE_PROVIDER_SITE_OTHER): Payer: Self-pay

## 2022-02-28 ENCOUNTER — Ambulatory Visit: Payer: Managed Care, Other (non HMO)

## 2022-03-14 ENCOUNTER — Ambulatory Visit: Payer: Managed Care, Other (non HMO)

## 2022-03-15 ENCOUNTER — Ambulatory Visit: Payer: Managed Care, Other (non HMO) | Attending: Dentistry

## 2022-03-15 DIAGNOSIS — M542 Cervicalgia: Secondary | ICD-10-CM | POA: Diagnosis not present

## 2022-03-15 NOTE — Therapy (Signed)
OUTPATIENT PHYSICAL THERAPY TREATMENT NOTE/RECERT   Patient Name: Danielle Lin MRN: [id number] DOB:04-Apr-1976, 46 y.o., female Today's Date: 03/15/2022  PCP: Lennie Odor, PA  REFERRING PROVIDER: Johnnette Litter MD    PT End of Session - 03/15/22 0806     Visit Number 17    Number of Visits 25    Date for PT Re-Evaluation 04/04/22    Authorization Type [employer]    Authorization Time Period 10/16/21-12/11/21    PT Start Time 0802    PT Stop Time 0833    PT Time Calculation (min) 31 min    Activity Tolerance Patient tolerated treatment well;No increased pain    Behavior During Therapy WFL for tasks assessed/performed                           Past Medical History:  Diagnosis Date   Anxiety    Arthritis    Asthma    Depression    Diabetes mellitus type 2, uncomplicated ([place])    pt reports pre DM before weight lose surgery   GERD without esophagitis    Heavy menses    Hypertension    IDA (iron deficiency anemia)    Obstructive sleep apnea on CPAP    Past Surgical History:  Procedure Laterality Date   arm surgery Left    BARIATRIC SURGERY     CARPAL TUNNEL RELEASE Bilateral    CHOLECYSTECTOMY     GASTROPLASTY DUODENAL SWITCH     KNEE ARTHROSCOPY Left 02/19/2022   Procedure: ARTHROSCOPY KNEE CHONDRAL DEBRIDEMENT MEDIAL AND LATERAL COMPARTMENT;  Surgeon: Dorna Leitz, MD;  Location: [facility];  Service: Orthopedics;  Laterality: Left;   Patient Active Problem List   Diagnosis Date Noted   Iron deficiency anemia due to chronic blood loss 08/13/2018    REFERRING DIAG: TMJ   THERAPY DIAG:  Cervicalgia  Rationale for Evaluation and Treatment Rehabilitation  PERTINENT HISTORY: Patient has a history of bite changes about 2 years ago. Had extensive PT with [facility] and Dr. Wynelle Beckmann cleared her several months ago for dental treatment. She was then seen for a temporary bridge to restore her bite, which required significant adjustment.  PMH includes  cervicalgia, anxiety, numbness and tingling, OSA, depression, lymphedema. Patient is going through extensive dental procedures and is going back in a month  PRECAUTIONS: n/a  SUBJECTIVE: Patient has not been seen since 10/ 11/23. Getting her R upper bridge completed today at 10 am.   PAIN:  Are you having pain? Yes: NPRS scale: 2/10 Pain location: R jaw Pain description: aching Aggravating factors: n/a Relieving factors: n/a     TODAY'S TREATMENT:     10/13/21  Right Left  Flexion 50  Extension 55  Side Bending 55 48  Rotation 70 55   Goals:   10/11 Right Left  Flexion 55  Extension 59  Side Bending 54 54  Rotation 70 62       Manual: Grade II mobilizations to jaw inferior glide x3 minutes L and R Grade II mobilization to cervical spine in supine position x 3 minutes Rotation with overpressure at occiput and GH joint 30 second seconds x2 trials Suboccipital release 30 seconds x 2 trials    Trigger Point Dry Needling (TDN), unbilled Education performed with patient regarding potential benefit of TDN. Reviewed precautions and risks with patient. Reviewed special precautions/risks over lung fields which include pneumothorax. Reviewed signs and symptoms of pneumothorax and advised pt to go to  ER immediately if these symptoms develop advise them of dry needling treatment. Extensive time spent with pt to ensure full understanding of TDN risks. Pt provided verbal consent to treatment. TDN performed to  with 0.25 x 40 single needle placements with local twitch response (LTR). Pistoning technique utilized. Improved pain-free motion following intervention. Muscles targeted: massater, lateral pterygoid, temporalis. X 20 minutes     PATIENT EDUCATION: Education details: TDN, pain reduction  Person educated: Patient Education method: Explanation, Demonstration, Tactile cues, and Verbal cues Education comprehension: verbalized understanding, returned demonstration, verbal cues  required, and tactile cues required   HOME EXERCISE PROGRAM: Access Code: [email or web address] URL: [email or web address] Date: 10/19/2021 Prepared by: Janna Arch  Exercises - External Jaw Release  - 1 x daily - 7 x weekly - 2 sets - 10 reps - 5 hold - Jaw Protraction  - 1 x daily - 7 x weekly - 2 sets - 10 reps - 5 hold - Side-to-Side Jaw Movement  - 1 x daily - 7 x weekly - 2 sets - 10 reps - 5 hold   PT Short Term Goals       PT SHORT TERM GOAL #1   Title Patient will be independent in home exercise program to improve strength/mobility for better functional independence with ADLs.    Baseline 6/19: HEP next session    Time 4    Period Weeks    Status MET   Target Date 11/13/21      PT SHORT TERM GOAL #2   Title Patient will report a worst pain of 5/10 on VAS in R jaw to improve tolerance with ADLs and reduced symptoms with activities.    Baseline 6/19: 10/10 8/16: 7/10 10/11: 6/10    Time 4    Period Weeks    Status Partially Met   Target Date 03/07/2022                 PT Long Term Goals       PT LONG TERM GOAL #1   Title Patient will increase FOTO score to equal to or greater than  90   to demonstrate statistically significant improvement in mobility and quality of life.    Baseline 6/19: 85%  8/16: 71% 10/11: 60%   Time 8    Period Weeks    Status On Going   Target Date 04/04/2022         PT LONG TERM GOAL #2   Title Patient will report a worst pain of 3/10 on VAS in R jaw to improve tolerance with ADLs and reduced symptoms with activities.    Baseline 6/19: 10/10 8/16: 7/10 10/11: 6/10    Time 8    Period Weeks    Status Partially Met   Target Date 04/04/2022         PT LONG TERM GOAL #3   Title Pt will demonstrate decrease in NDI by at least 19% in order to demonstrate clinically significant reduction in disability related to neck injury/pain    Baseline 6/19: 26%  8/16: 18/50 10/11: 14/50   Time 8    Period Weeks    Status Partially  Met   Target Date 04/04/2022         PT LONG TERM GOAL #5   Title Patient will improve cervical ROM to within 5 degrees of normal for return to PLOF    Baseline 6/19: see note 8/16: see note 10/11 : see above   Time 8  Period Weeks    Status MET   Target Date 02/07/2022                 Plan    Clinical Impression Statement Patient will finish her dental procedures later today. She tolerates interventions well without increase in pain. Patient has not been seen since 10/ 11/23. Since then she has had an arthroscopy surgery.   Pt will benefit from skilled PT services to address deficits and return to pain-free function at home and work.   Personal Factors and Comorbidities Comorbidity 3+;Fitness;Past/Current Experience;Profession;Time since onset of injury/illness/exacerbation    Comorbidities cervicalgia, anxiety, numbness and tingling, OSA, depression, lymphedema.    Examination-Activity Limitations Self Feeding    Examination-Participation Restrictions Occupation;Other   eating   Stability/Clinical Decision Making Evolving/Moderate complexity    Rehab Potential Fair    PT Frequency 1x/week    PT Duration 8 weeks    PT Treatment/Interventions ADLs/Self Care Home Management;Cryotherapy;Moist Heat;Iontophoresis 27m/ml Dexamethasone;Electrical Stimulation;Traction;Ultrasound;Therapeutic activities;Functional mobility training;Therapeutic exercise;Balance training;Neuromuscular re-education;Patient/family education;Manual techniques;Manual lymph drainage;Taping;Splinting;Energy conservation;Dry needling;Passive range of motion;Vasopneumatic Device;Vestibular;Visual/perceptual remediation/compensation;Canalith Repostioning    PT Next Visit Plan TDN, mobilization, cervical alignment    Consulted and Agree with Plan of Care Patient               MJanna Arch PT, DPT  03/15/2022, 9:31 AM

## 2022-03-27 NOTE — Therapy (Signed)
OUTPATIENT PHYSICAL THERAPY TREATMENT NOTE   Patient Name: Danielle Lin MRN: [id number] DOB:[birth year], 46 y.o., female Today's Date: 03/28/2022  PCP: Lennie Odor, PA  REFERRING PROVIDER: Johnnette Litter MD    PT End of Session - 03/28/22 2376     Visit Number 18    Number of Visits 25    Date for PT Re-Evaluation 04/04/22    Authorization Type [employer]    Authorization Time Period 10/16/21-12/11/21    PT Start Time 0715    PT Stop Time 0748    PT Time Calculation (min) 33 min    Activity Tolerance Patient tolerated treatment well;No increased pain    Behavior During Therapy WFL for tasks assessed/performed                            Past Medical History:  Diagnosis Date   Anxiety    Arthritis    Asthma    Depression    Diabetes mellitus type 2, uncomplicated ([place])    pt reports pre DM before weight lose surgery   GERD without esophagitis    Heavy menses    Hypertension    IDA (iron deficiency anemia)    Obstructive sleep apnea on CPAP    Past Surgical History:  Procedure Laterality Date   arm surgery Left    BARIATRIC SURGERY     CARPAL TUNNEL RELEASE Bilateral    CHOLECYSTECTOMY     GASTROPLASTY DUODENAL SWITCH     KNEE ARTHROSCOPY Left 02/19/2022   Procedure: ARTHROSCOPY KNEE CHONDRAL DEBRIDEMENT MEDIAL AND LATERAL COMPARTMENT;  Surgeon: Dorna Leitz, MD;  Location: [facility];  Service: Orthopedics;  Laterality: Left;   Patient Active Problem List   Diagnosis Date Noted   Iron deficiency anemia due to chronic blood loss 08/13/2018    REFERRING DIAG: TMJ   THERAPY DIAG:  Cervicalgia  Rationale for Evaluation and Treatment Rehabilitation  PERTINENT HISTORY: Patient has a history of bite changes about 2 years ago. Had extensive PT with [facility] and Dr. Wynelle Beckmann cleared her several months ago for dental treatment. She was then seen for a temporary bridge to restore her bite, which required significant adjustment.  PMH includes  cervicalgia, anxiety, numbness and tingling, OSA, depression, lymphedema. Patient is going through extensive dental procedures and is going back in a month  PRECAUTIONS: n/a  SUBJECTIVE: Patient reports her last procedure went well. Is feeling much better    PAIN:  Are you having pain? Yes: NPRS scale: 2/10 Pain location: R jaw Pain description: aching Aggravating factors: n/a Relieving factors: n/a     TODAY'S TREATMENT:     10/13/21  Right Left  Flexion 50  Extension 55  Side Bending 55 48  Rotation 70 55   Goals:   10/11 Right Left  Flexion 55  Extension 59  Side Bending 54 54  Rotation 70 62       Manual: Grade II mobilizations to jaw inferior glide x3 minutes L and R Grade II mobilization to cervical spine in supine position x 3 minutes Rotation with overpressure at occiput and GH joint 30 second seconds x2 trials Suboccipital release 30 seconds x 2 trials    Trigger Point Dry Needling (TDN), unbilled Education performed with patient regarding potential benefit of TDN. Reviewed precautions and risks with patient. Reviewed special precautions/risks over lung fields which include pneumothorax. Reviewed signs and symptoms of pneumothorax and advised pt to go to ER immediately if these symptoms  develop advise them of dry needling treatment. Extensive time spent with pt to ensure full understanding of TDN risks. Pt provided verbal consent to treatment. TDN performed to  with 0.25 x 40 single needle placements with local twitch response (LTR). Pistoning technique utilized. Improved pain-free motion following intervention. Muscles targeted: massater, lateral pterygoid, temporalis. X 20 minutes     PATIENT EDUCATION: Education details: TDN, pain reduction  Person educated: Patient Education method: Explanation, Demonstration, Tactile cues, and Verbal cues Education comprehension: verbalized understanding, returned demonstration, verbal cues required, and tactile  cues required   HOME EXERCISE PROGRAM: Access Code: [email or web address] URL: [email or web address] Date: 10/19/2021 Prepared by: Janna Arch  Exercises - External Jaw Release  - 1 x daily - 7 x weekly - 2 sets - 10 reps - 5 hold - Jaw Protraction  - 1 x daily - 7 x weekly - 2 sets - 10 reps - 5 hold - Side-to-Side Jaw Movement  - 1 x daily - 7 x weekly - 2 sets - 10 reps - 5 hold   PT Short Term Goals       PT SHORT TERM GOAL #1   Title Patient will be independent in home exercise program to improve strength/mobility for better functional independence with ADLs.    Baseline 6/19: HEP next session    Time 4    Period Weeks    Status MET   Target Date 11/13/21      PT SHORT TERM GOAL #2   Title Patient will report a worst pain of 5/10 on VAS in R jaw to improve tolerance with ADLs and reduced symptoms with activities.    Baseline 6/19: 10/10 8/16: 7/10 10/11: 6/10    Time 4    Period Weeks    Status Partially Met   Target Date 03/07/2022                 PT Long Term Goals       PT LONG TERM GOAL #1   Title Patient will increase FOTO score to equal to or greater than  90   to demonstrate statistically significant improvement in mobility and quality of life.    Baseline 6/19: 85%  8/16: 71% 10/11: 60%   Time 8    Period Weeks    Status On Going   Target Date 04/04/2022         PT LONG TERM GOAL #2   Title Patient will report a worst pain of 3/10 on VAS in R jaw to improve tolerance with ADLs and reduced symptoms with activities.    Baseline 6/19: 10/10 8/16: 7/10 10/11: 6/10    Time 8    Period Weeks    Status Partially Met   Target Date 04/04/2022         PT LONG TERM GOAL #3   Title Pt will demonstrate decrease in NDI by at least 19% in order to demonstrate clinically significant reduction in disability related to neck injury/pain    Baseline 6/19: 26%  8/16: 18/50 10/11: 14/50   Time 8    Period Weeks    Status Partially Met   Target Date  04/04/2022         PT LONG TERM GOAL #5   Title Patient will improve cervical ROM to within 5 degrees of normal for return to PLOF    Baseline 6/19: see note 8/16: see note 10/11 : see above   Time 8    Period Weeks  Status MET   Target Date 02/07/2022                 Plan    Clinical Impression Statement Patient is progressing well and will be discharged next session pending any unforseen circumstances. Patient agreeable to this POC.   Pt will benefit from skilled PT services to address deficits and return to pain-free function at home and work.   Personal Factors and Comorbidities Comorbidity 3+;Fitness;Past/Current Experience;Profession;Time since onset of injury/illness/exacerbation    Comorbidities cervicalgia, anxiety, numbness and tingling, OSA, depression, lymphedema.    Examination-Activity Limitations Self Feeding    Examination-Participation Restrictions Occupation;Other   eating   Stability/Clinical Decision Making Evolving/Moderate complexity    Rehab Potential Fair    PT Frequency 1x/week    PT Duration 8 weeks    PT Treatment/Interventions ADLs/Self Care Home Management;Cryotherapy;Moist Heat;Iontophoresis 43m/ml Dexamethasone;Electrical Stimulation;Traction;Ultrasound;Therapeutic activities;Functional mobility training;Therapeutic exercise;Balance training;Neuromuscular re-education;Patient/family education;Manual techniques;Manual lymph drainage;Taping;Splinting;Energy conservation;Dry needling;Passive range of motion;Vasopneumatic Device;Vestibular;Visual/perceptual remediation/compensation;Canalith Repostioning    PT Next Visit Plan TDN, mobilization, cervical alignment    Consulted and Agree with Plan of Care Patient               MJanna Arch PT, DPT  03/28/2022, 9:02 AM

## 2022-03-28 ENCOUNTER — Ambulatory Visit: Payer: Managed Care, Other (non HMO)

## 2022-03-28 DIAGNOSIS — M542 Cervicalgia: Secondary | ICD-10-CM

## 2022-04-10 NOTE — Therapy (Signed)
OUTPATIENT PHYSICAL THERAPY TREATMENT NOTE/RECERT 1x/DISCHARGE    Patient Name: Danielle Lin MRN: [id number] DOB:1976/02/23, 46 y.o., female Today's Date: 04/11/2022  PCP: Lennie Odor, PA  REFERRING PROVIDER: Johnnette Litter MD    PT End of Session - 04/11/22 0709     Visit Number 19    Number of Visits 25    Date for PT Re-Evaluation 04/11/22    Authorization Type [employer]    Authorization Time Period 10/16/21-12/11/21    PT Start Time 0712    PT Stop Time 0750    PT Time Calculation (min) 38 min    Activity Tolerance Patient tolerated treatment well;No increased pain    Behavior During Therapy WFL for tasks assessed/performed                            Past Medical History:  Diagnosis Date   Anxiety    Arthritis    Asthma    Depression    Diabetes mellitus type 2, uncomplicated ([place])    pt reports pre DM before weight lose surgery   GERD without esophagitis    Heavy menses    Hypertension    IDA (iron deficiency anemia)    Obstructive sleep apnea on CPAP    Past Surgical History:  Procedure Laterality Date   arm surgery Left    BARIATRIC SURGERY     CARPAL TUNNEL RELEASE Bilateral    CHOLECYSTECTOMY     GASTROPLASTY DUODENAL SWITCH     KNEE ARTHROSCOPY Left 02/19/2022   Procedure: ARTHROSCOPY KNEE CHONDRAL DEBRIDEMENT MEDIAL AND LATERAL COMPARTMENT;  Surgeon: Dorna Leitz, MD;  Location: [facility];  Service: Orthopedics;  Laterality: Left;   Patient Active Problem List   Diagnosis Date Noted   Iron deficiency anemia due to chronic blood loss 08/13/2018    REFERRING DIAG: TMJ   THERAPY DIAG:  Cervicalgia  Rationale for Evaluation and Treatment Rehabilitation  PERTINENT HISTORY: Patient has a history of bite changes about 2 years ago. Had extensive PT with [facility] and Dr. Wynelle Beckmann cleared her several months ago for dental treatment. She was then seen for a temporary bridge to restore her bite, which required significant adjustment.   PMH includes cervicalgia, anxiety, numbness and tingling, OSA, depression, lymphedema. Patient is going through extensive dental procedures and is going back in a month  PRECAUTIONS: n/a  SUBJECTIVE: Patient is aware today is discharge day.    PAIN:  Are you having pain? Yes: NPRS scale: 2/10 Pain location: R jaw Pain description: aching Aggravating factors: n/a Relieving factors: n/a     TODAY'S TREATMENT:     10/13/21  Right Left  Flexion 50  Extension 55  Side Bending 55 48  Rotation 70 55   Goals:   10/11 Right Left  Flexion 55  Extension 59  Side Bending 54 54  Rotation 70 62       Manual: Grade II mobilizations to jaw inferior glide x3 minutes L and R Grade II mobilization to cervical spine in supine position x 3 minutes Rotation with overpressure at occiput and GH joint 30 second seconds x2 trials Suboccipital release 30 seconds x 2 trials    Trigger Point Dry Needling (TDN), unbilled Education performed with patient regarding potential benefit of TDN. Reviewed precautions and risks with patient. Reviewed special precautions/risks over lung fields which include pneumothorax. Reviewed signs and symptoms of pneumothorax and advised pt to go to ER immediately if these symptoms develop advise  them of dry needling treatment. Extensive time spent with pt to ensure full understanding of TDN risks. Pt provided verbal consent to treatment. TDN performed to  with 0.25 x 40 single needle placements with local twitch response (LTR). Pistoning technique utilized. Improved pain-free motion following intervention. Muscles targeted: massater, lateral pterygoid, temporalis. X 20 minutes     PATIENT EDUCATION: Education details: TDN, pain reduction  Person educated: Patient Education method: Explanation, Demonstration, Tactile cues, and Verbal cues Education comprehension: verbalized understanding, returned demonstration, verbal cues required, and tactile cues  required   HOME EXERCISE PROGRAM: Access Code: [email or web address] URL: [email or web address] Date: 10/19/2021 Prepared by: Janna Arch  Exercises - External Jaw Release  - 1 x daily - 7 x weekly - 2 sets - 10 reps - 5 hold - Jaw Protraction  - 1 x daily - 7 x weekly - 2 sets - 10 reps - 5 hold - Side-to-Side Jaw Movement  - 1 x daily - 7 x weekly - 2 sets - 10 reps - 5 hold   PT Short Term Goals       PT SHORT TERM GOAL #1   Title Patient will be independent in home exercise program to improve strength/mobility for better functional independence with ADLs.    Baseline 6/19: HEP next session    Time 4    Period Weeks    Status MET   Target Date 11/13/21      PT SHORT TERM GOAL #2   Title Patient will report a worst pain of 5/10 on VAS in R jaw to improve tolerance with ADLs and reduced symptoms with activities.    Baseline 6/19: 10/10 8/16: 7/10 10/11: 6/10 12/13: 2/10   Time 4    Period Weeks    Status Met   Target Date 03/07/2022                 PT Long Term Goals       PT LONG TERM GOAL #1   Title Patient will increase FOTO score to equal to or greater than  90   to demonstrate statistically significant improvement in mobility and quality of life.    Baseline 6/19: 85%  8/16: 71% 10/11: 60% 12/13: 97%    Time 8    Period Weeks    Status MET   Target Date 04/04/2022         PT LONG TERM GOAL #2   Title Patient will report a worst pain of 3/10 on VAS in R jaw to improve tolerance with ADLs and reduced symptoms with activities.    Baseline 6/19: 10/10 8/16: 7/10 10/11: 6/10 12/13: 2/10   Time 8    Period Weeks    Status Met   Target Date 04/04/2022         PT LONG TERM GOAL #3   Title Pt will demonstrate decrease in [place] by at least 19% in order to demonstrate clinically significant reduction in disability related to neck injury/pain    Baseline 6/19: 26%  8/16: 18/50 10/11: 14/50 12/13: 8/50   Time 8    Period Weeks    Status Met   Target  Date 04/04/2022         PT LONG TERM GOAL #5   Title Patient will improve cervical ROM to within 5 degrees of normal for return to PLOF    Baseline 6/19: see note 8/16: see note 10/11 : see above   Time 8    Period Weeks  Status MET   Target Date 02/07/2022                 Plan    Clinical Impression Statement Patient is agreeable to discharge; this will be a one time recert for discharge. Independent with pain management and all goals are met. I will be happy to see the patient again in the future as needed.    Personal Factors and Comorbidities Comorbidity 3+;Fitness;Past/Current Experience;Profession;Time since onset of injury/illness/exacerbation    Comorbidities cervicalgia, anxiety, numbness and tingling, OSA, depression, lymphedema.    Examination-Activity Limitations Self Feeding    Examination-Participation Restrictions Occupation;Other   eating   Stability/Clinical Decision Making Evolving/Moderate complexity    Rehab Potential Fair    PT Frequency 1x/week    PT Duration 8 weeks    PT Treatment/Interventions ADLs/Self Care Home Management;Cryotherapy;Moist Heat;Iontophoresis 52m/ml Dexamethasone;Electrical Stimulation;Traction;Ultrasound;Therapeutic activities;Functional mobility training;Therapeutic exercise;Balance training;Neuromuscular re-education;Patient/family education;Manual techniques;Manual lymph drainage;Taping;Splinting;Energy conservation;Dry needling;Passive range of motion;Vasopneumatic Device;Vestibular;Visual/perceptual remediation/compensation;Canalith Repostioning    PT Next Visit Plan TDN, mobilization, cervical alignment    Consulted and Agree with Plan of Care Patient               MJanna Arch PT, DPT  04/11/2022, 7:50 AM

## 2022-04-11 ENCOUNTER — Ambulatory Visit: Payer: Managed Care, Other (non HMO) | Attending: Dentistry

## 2022-04-11 DIAGNOSIS — M542 Cervicalgia: Secondary | ICD-10-CM | POA: Diagnosis present

## 2022-04-25 ENCOUNTER — Ambulatory Visit: Payer: Managed Care, Other (non HMO)

## 2022-06-19 ENCOUNTER — Ambulatory Visit
Payer: Managed Care, Other (non HMO) | Attending: Student in an Organized Health Care Education/Training Program | Admitting: Student in an Organized Health Care Education/Training Program

## 2022-06-19 ENCOUNTER — Encounter: Payer: Self-pay | Admitting: Student in an Organized Health Care Education/Training Program

## 2022-06-19 VITALS — BP 149/96 | HR 85 | Temp 97.3°F | Resp 16 | Ht 59.0 in | Wt 280.0 lb

## 2022-06-19 DIAGNOSIS — I890 Lymphedema, not elsewhere classified: Secondary | ICD-10-CM | POA: Insufficient documentation

## 2022-06-19 DIAGNOSIS — M25562 Pain in left knee: Secondary | ICD-10-CM | POA: Insufficient documentation

## 2022-06-19 DIAGNOSIS — G8929 Other chronic pain: Secondary | ICD-10-CM

## 2022-06-19 DIAGNOSIS — Z9889 Other specified postprocedural states: Secondary | ICD-10-CM | POA: Diagnosis not present

## 2022-06-19 NOTE — Progress Notes (Signed)
Patient: Danielle Lin  Service Category: E/M  Provider: Gillis Santa, MD  DOB: 23-Dec-1975  DOS: 06/19/2022  Referring Provider: Lennie Odor, PA  MRN: [id number]  Setting: Ambulatory outpatient  PCP: Lennie Odor, PA  Type: New Patient  Specialty: Interventional Pain Management    Location: Office  Delivery: Face-to-face     Primary Reason(s) for Visit: Encounter for initial evaluation of one or more chronic problems (new to examiner) potentially causing chronic pain, and posing a threat to normal musculoskeletal function. (Level of risk: High) CC: Knee Pain (Bilateral, left worse)  HPI  Danielle Lin is a 47 y.o. year old, female patient, who comes for the first time to our practice referred by Lennie Odor, PA for our initial evaluation of her chronic pain. She has Iron deficiency anemia due to chronic blood loss; Chronic pain of left knee; Status post arthroscopic surgery of left knee; and Lymphedema on their problem list. Today she comes in for evaluation of her Knee Pain (Bilateral, left worse)  Pain Assessment: Location: Left, Right Knee Radiating: left worse Onset: More than a month ago Duration: Chronic pain Quality: Aching, Constant, Stabbing, Tender, Discomfort, Grimacing Severity: 6 /10 (subjective, self-reported pain score)  Effect on ADL: movement, prolonged walking and standing, daily ADL's Timing: Constant Modifying factors: rest, medications BP: (!) 149/96  HR: 85  Onset and Duration: Sudden Cause of pain: Unknown Severity: No change since onset, NAS-11 at its worse: 10/10, NAS-11 at its best: 4/10, NAS-11 now: 8/10, and NAS-11 on the average: 8/10 Timing: Night, During activity or exercise, After activity or exercise, and After a period of immobility Aggravating Factors: Bending, Kneeling, Lifiting, Motion, Prolonged sitting, Prolonged standing, Squatting, Stooping , Walking uphill, Walking downhill, and Working Alleviating Factors: Cold packs, Lying down,  Medications, Resting, and Warm showers or baths Associated Problems: Depression, Fatigue, Spasms, Swelling, Weakness, Pain that wakes patient up, and Pain that does not allow patient to sleep Quality of Pain: Aching, Constant, Deep, Exhausting, Heavy, Pulsating, Sharp, Shooting, Stabbing, and Tender Previous Examinations or Tests: MRI scan, X-rays, and Orthopedic evaluation Previous Treatments: Narcotic medications  Danielle Lin is being evaluated for possible interventional pain management therapies for the treatment of her chronic pain.   Persistent left knee pain, status post left knee arthroscopy October 2023.  She has tried an intra-articular knee injection with limited response prior to her knee surgery.  She has chronic lymphedema.  She has a history of depression and anxiety and is on Wellbutrin and Celexa.  She has a history of morbid obesity status post gastric bypass surgery.  She was previously on hydrocodone, prescribed by her orthopedic doctor who recently transition her to tramadol.  Danielle Lin has been informed that this initial visit was an evaluation only.  On the follow up appointment I will go over the results, including ordered tests and available interventional therapies. At that time she will have the opportunity to decide whether to proceed with offered therapies or not. In the event that Danielle Lin prefers avoiding interven Meds   Current Outpatient Medications:    acetaminophen (TYLENOL) 500 MG tablet, Take 1,000 mg by mouth every 6 (six) hours as needed for moderate pain., Disp: , Rfl:    albuterol (VENTOLIN HFA) 108 (90 Base) MCG/ACT inhaler, Inhale 1 puff into the lungs every 6 (six) hours as needed for wheezing or shortness of breath., Disp: , Rfl:    buPROPion (WELLBUTRIN XL) 300 MG 24 hr tablet, Take 300 mg by mouth daily., Disp: , Rfl:  CALCIUM-VITAMIN D PO, Take 2 tablets by mouth daily., Disp: , Rfl:    citalopram (CELEXA) 40 MG tablet, Take 40 mg by mouth  daily., Disp: , Rfl:    Cyanocobalamin (B-12 SL), Place 1 tablet under the tongue daily., Disp: , Rfl:    furosemide (LASIX) 20 MG tablet, Take 20 mg by mouth daily., Disp: , Rfl:    hydrOXYzine (ATARAX) 25 MG tablet, Take 25-50 mg by mouth daily., Disp: , Rfl:    Iron-Vit C-Vit B12-Folic Acid 123XX123 MG TABS, Take 1 tablet by mouth daily., Disp: 30 each, Rfl: 2   levonorgestrel (LILETTA) 19.5 MCG/DAY IUD IUD, 1 each by Intrauterine route once., Disp: , Rfl:    meloxicam (MOBIC) 15 MG tablet, Take 15 mg by mouth daily., Disp: , Rfl:    Multiple Vitamin (MULTI-VITAMIN) tablet, Take 1 tablet by mouth daily., Disp: , Rfl:    NON FORMULARY, Pt uses a cpap nightly, Disp: , Rfl:    omeprazole (PRILOSEC) 20 MG capsule, Take 20 mg by mouth 2 (two) times daily before a meal., Disp: , Rfl:    ondansetron (ZOFRAN) 4 MG tablet, Take 4 mg by mouth every 8 (eight) hours as needed for vomiting or nausea., Disp: , Rfl:    traMADol (ULTRAM) 50 MG tablet, Take by mouth every 6 (six) hours as needed., Disp: , Rfl:   Imaging Review   MR Knee Right Wo Contrast  Narrative CLINICAL DATA:  Knee pain. Popping with movements. Tight sensation in the knee for 4-5 months.  EXAM: MRI OF THE RIGHT KNEE WITHOUT CONTRAST  TECHNIQUE: Multiplanar, multisequence MR imaging of the knee was performed. No intravenous contrast was administered.  COMPARISON:  None.  FINDINGS: Body habitus reduces diagnostic sensitivity and specificity.  MENISCI  Medial meniscus: Grade 1 signal in the posterior horn. No surface extension.  Lateral meniscus:  Unremarkable  LIGAMENTS  Cruciates:  Unremarkable  Collaterals: MCL thickening with low-grade suspected edema, for example on images 16-18 of series 3.  CARTILAGE  Patellofemoral:  Unremarkable  Medial:  Moderate degenerative chondral thinning.  Lateral:  Mild degenerative chondral thinning.  Joint:  Small knee effusion.  Popliteal Fossa: 4.7 cm  craniocaudad Baker's cyst. Mild pes anserine bursitis.  Extensor Mechanism:  Mild distal patellar tendinopathy.  Bones: Red marrow redistribution is present. This is nonspecific can be associated with a variety of conditions including chronic anemia, chronic heart failure, myelofibrosis, infiltrative marrow disease, response to infection, and other conditions.  IMPRESSION: 1. Grade 2 MCL sprain or partial tear. 2. Small knee effusion with small to moderate size Baker's cyst and mild pes anserine bursitis. 3. Mild distal patellar tendinopathy. 4. Red marrow redistribution is present. This is nonspecific can be associated with a variety of conditions including chronic anemia, chronic heart failure, myelofibrosis, infiltrative marrow disease, response to infection, and other conditions.   Electronically Signed By: Van Clines M.D. On: 09/15/2014 17:14    Complexity Note: Imaging results reviewed.                         ROS  Cardiovascular: No reported cardiovascular signs or symptoms such as High blood pressure, coronary artery disease, abnormal heart rate or rhythm, heart attack, blood thinner therapy or heart weakness and/or failure Pulmonary or Respiratory: Wheezing and difficulty taking a deep full breath (Asthma) and Temporary stoppage of breathing during sleep Neurological: No reported neurological signs or symptoms such as seizures, abnormal skin sensations, urinary and/or fecal incontinence, being  born with an abnormal open spine and/or a tethered spinal cord Psychological-Psychiatric: Anxiousness, Depressed, and Prone to panicking Gastrointestinal: Reflux or heatburn Genitourinary: No reported renal or genitourinary signs or symptoms such as difficulty voiding or producing urine, peeing blood, non-functioning kidney, kidney stones, difficulty emptying the bladder, difficulty controlling the flow of urine, or chronic kidney disease Hematological: Weakness due to low  blood hemoglobin or red blood cell count (Anemia) and Brusing easily Endocrine: No reported endocrine signs or symptoms such as high or low blood sugar, rapid heart rate due to high thyroid levels, obesity or weight gain due to slow thyroid or thyroid disease Rheumatologic: Joint aches and or swelling due to excess weight (Osteoarthritis) Musculoskeletal: Negative for myasthenia gravis, muscular dystrophy, multiple sclerosis or malignant hyperthermia Work History: Working full time  Allergies  Danielle Lin is allergic to tetanus toxoid and tetanus-diphtheria toxoids td.  Laboratory Chemistry Profile   Renal Lab Results  Component Value Date   BUN 13 02/15/2022   CREATININE 0.77 02/15/2022   GFRAA >60 07/11/2018   GFRNONAA >60 02/15/2022   PROTEINUR NEGATIVE 05/13/2015     Electrolytes Lab Results  Component Value Date   NA 141 02/15/2022   K 4.2 02/15/2022   CL 104 02/15/2022   CALCIUM 9.6 02/15/2022     Hepatic Lab Results  Component Value Date   AST 17 07/11/2018   ALT 12 07/11/2018   ALBUMIN 4.1 07/11/2018   ALKPHOS 60 07/11/2018     ID No results found for: "LYMEIGGIGMAB", "HIV", "SARSCOV2NAA", "STAPHAUREUS", "MRSAPCR", "HCVAB", "PREGTESTUR", "RMSFIGG", "QFVRPH1IGG", "QFVRPH2IGG"   Bone No results found for: "VD25OH", "VD125OH2TOT", "[id number]", "[id number]", "25OHVITD1", "25OHVITD2", "25OHVITD3", "TESTOFREE", "TESTOSTERONE"   Endocrine Lab Results  Component Value Date   GLUCOSE 91 02/15/2022   GLUCOSEU NEGATIVE 05/13/2015   HGBA1C 5.5 02/15/2022     Neuropathy Lab Results  Component Value Date   HGBA1C 5.5 02/15/2022     CNS No results found for: "COLORCSF", "APPEARCSF", "RBCCOUNTCSF", "WBCCSF", "POLYSCSF", "LYMPHSCSF", "EOSCSF", "PROTEINCSF", "GLUCCSF", "JCVIRUS", "CSFOLI", "IGGCSF", "LABACHR", "ACETBL"   Inflammation (CRP: Acute  ESR: Chronic) No results found for: "CRP", "ESRSEDRATE", "LATICACIDVEN"   Rheumatology No results found for: "RF",  "ANA", "LABURIC", "URICUR", "LYMEIGGIGMAB", "LYMEABIGMQN", "HLAB27"   Coagulation Lab Results  Component Value Date   PLT 366 02/15/2022     Cardiovascular Lab Results  Component Value Date   HGB 12.9 02/15/2022   HCT 40.8 02/15/2022     Screening No results found for: "SARSCOV2NAA", "COVIDSOURCE", "STAPHAUREUS", "MRSAPCR", "HCVAB", "HIV", "PREGTESTUR"   Cancer No results found for: "CEA", "CA125", "LABCA2"   Allergens No results found for: "ALMOND", "APPLE", "ASPARAGUS", "AVOCADO", "BANANA", "BARLEY", "BASIL", "BAYLEAF", "GREENBEAN", "LIMABEAN", "WHITEBEAN", "BEEFIGE", "REDBEET", "BLUEBERRY", "BROCCOLI", "CABBAGE", "MELON", "CARROT", "CASEIN", "CASHEWNUT", "CAULIFLOWER", "CELERY"     Note: Lab results reviewed.  [place]  Drug: Danielle Lin  reports no history of drug use. Alcohol:  reports no history of alcohol use. Tobacco:  reports that she has never smoked. She has never used smokeless tobacco. Medical:  has a past medical history of Anxiety, Arthritis, Asthma, Depression, Diabetes mellitus type 2, uncomplicated ([place]), GERD without esophagitis, Heavy menses, Hypertension, IDA (iron deficiency anemia), and Obstructive sleep apnea on CPAP. Family: family history includes Bladder Cancer in her mother; Breast cancer in her maternal grandmother; Uterine cancer in her mother.  Past Surgical History:  Procedure Laterality Date   arm surgery Left    BARIATRIC SURGERY     CARPAL TUNNEL RELEASE Bilateral    CHOLECYSTECTOMY     GASTROPLASTY  DUODENAL SWITCH     KNEE ARTHROSCOPY Left 02/19/2022   Procedure: ARTHROSCOPY KNEE CHONDRAL DEBRIDEMENT MEDIAL AND LATERAL COMPARTMENT;  Surgeon: Dorna Leitz, MD;  Location: [facility];  Service: Orthopedics;  Laterality: Left;   Active Ambulatory Problems    Diagnosis Date Noted   Iron deficiency anemia due to chronic blood loss 08/13/2018   Chronic pain of left knee 06/19/2022   Status post arthroscopic surgery of left knee 06/19/2022    Lymphedema 06/19/2022   Resolved Ambulatory Problems    Diagnosis Date Noted   No Resolved Ambulatory Problems   Past Medical History:  Diagnosis Date   Anxiety    Arthritis    Asthma    Depression    Diabetes mellitus type 2, uncomplicated (HCC)    GERD without esophagitis    Heavy menses    Hypertension    IDA (iron deficiency anemia)    Obstructive sleep apnea on CPAP    Constitutional Exam  General appearance: alert, cooperative, and morbidly obese Vitals:   06/19/22 1417  BP: (!) 149/96  Pulse: 85  Resp: 16  Temp: (!) 97.3 F (36.3 C)  SpO2: 99%  Weight: 280 lb (127 kg)  Height: 4' 11"$  (1.499 m)   BMI Assessment: Estimated body mass index is 56.55 kg/m as calculated from the following:   Height as of this encounter: 4' 11"$  (1.499 m).   Weight as of this encounter: 280 lb (127 kg).  BMI interpretation table: BMI level Category Range association with higher incidence of chronic pain  <18 kg/m2 Underweight   18.5-24.9 kg/m2 Ideal body weight   25-29.9 kg/m2 Overweight Increased incidence by 20%  30-34.9 kg/m2 Obese (Class I) Increased incidence by 68%  35-39.9 kg/m2 Severe obesity (Class II) Increased incidence by 136%  >40 kg/m2 Extreme obesity (Class III) Increased incidence by 254%   Patient's current BMI Ideal Body weight  Body mass index is 56.55 kg/m. Patient must be at least 39 in tall to calculate ideal body weight   BMI Readings from Last 4 Encounters:  06/19/22 56.55 kg/m  02/19/22 55.54 kg/m  02/15/22 55.54 kg/m  01/19/22 55.54 kg/m   Wt Readings from Last 4 Encounters:  06/19/22 280 lb (127 kg)  02/19/22 275 lb (124.7 kg)  02/15/22 275 lb (124.7 kg)  01/19/22 275 lb (124.7 kg)    Psych/Mental status: Alert, oriented x 3 (person, place, & time)       Eyes: PERLA Respiratory: No evidence of acute respiratory distress  Gait & Posture Assessment  Ambulation: Limited Gait: Antalgic gait (limping) Posture: WNL  Lower Extremity Exam     Side: Right lower extremity  Side: Left lower extremity  Stability: No instability observed          Stability: No instability observed          Skin & Extremity Inspection: Edema  Skin & Extremity Inspection: Edema  Functional ROM: Decreased ROM for hip and knee joints          Functional ROM: Decreased ROM for hip and knee joints          Muscle Tone/Strength: Functionally intact. No obvious neuro-muscular anomalies detected.  Muscle Tone/Strength: Functionally intact. No obvious neuro-muscular anomalies detected.  Sensory (Neurological): Unimpaired        Sensory (Neurological): Arthropathic arthralgia        DTR: Patellar: deferred today Achilles: deferred today Plantar: deferred today  DTR: Patellar: deferred today Achilles: deferred today Plantar: deferred today  Palpation: No palpable anomalies  Palpation: No palpable anomalies    Assessment  Primary Diagnosis & Pertinent Problem List: The primary encounter diagnosis was Chronic pain of left knee. Diagnoses of Status post arthroscopic surgery of left knee (Oct 2023) and Lymphedema were also pertinent to this visit.  Visit Diagnosis (New problems to examiner): 1. Chronic pain of left knee   2. Status post arthroscopic surgery of left knee (Oct 2023)   3. Lymphedema    Plan of Care (Initial workup plan)  I discussed interventional options with the patient which could include left genicular nerve block and possible genicular nerve radiofrequency ablation.  She was specifically told prior to her visit that I am not taking patients on for medication management at this time.  She states that she was not aware of this.  She is currently on tramadol 50 mg every 6 hours as needed.  I defer tramadol management to primary care provider and orthopedic team.  I would caution long-term chronic opioid therapy and the patient has morbid obesity with depression and anxiety.  I informed her that if she would like to follow-up for a left genicular  nerve block to contact our clinic.  Patient endorsed understanding.   Provider-requested follow-up: Return if symptoms worsen or fail to improve.  No future appointments.  Duration of encounter: 37mnutes.  Total time on encounter, as per AMA guidelines included both the face-to-face and non-face-to-face time personally spent by the physician and/or other qualified health care professional(s) on the day of the encounter (includes time in activities that require the physician or other qualified health care professional and does not include time in activities normally performed by clinical staff). Physician's time may include the following activities when performed: Preparing to see the patient (e.g., pre-charting review of records, searching for previously ordered imaging, lab work, and nerve conduction tests) Review of prior analgesic pharmacotherapies. Reviewing PMP Interpreting ordered tests (e.g., lab work, imaging, nerve conduction tests) Performing post-procedure evaluations, including interpretation of diagnostic procedures Obtaining and/or reviewing separately obtained history Performing a medically appropriate examination and/or evaluation Counseling and educating the patient/family/caregiver Ordering medications, tests, or procedures Referring and communicating with other health care professionals (when not separately reported) Documenting clinical information in the electronic or other health record Independently interpreting results (not separately reported) and communicating results to the patient/ family/caregiver Care coordination (not separately reported)  Note by: BGillis Santa MD (TTS technology used. I apologize for any typographical errors that were not detected and corrected.) Date: 06/19/2022; Time: 3:32 PM

## 2022-06-19 NOTE — Progress Notes (Signed)
Safety precautions to be maintained throughout the outpatient stay will include: orient to surroundings, keep bed in low position, maintain call bell within reach at all times, provide assistance with transfer out of bed and ambulation.  

## 2022-06-19 NOTE — Patient Instructions (Addendum)
Consider GNB, follow up as PRN ______________________________________________________________________  Preparing for your procedure  During your procedure appointment there will be: No Prescription Refills. No disability issues to discussed. No medication changes or discussions.  Instructions: Food intake: Avoid eating anything solid for at least 8 hours prior to your procedure. Clear liquid intake: You may take clear liquids such as water up to 2 hours prior to your procedure. (No carbonated drinks. No soda.) Transportation: Unless otherwise stated by your physician, bring a driver. Morning Medicines: Except for blood thinners, take all of your other morning medications with a sip of water. Make sure to take your heart and blood pressure medicines. If your blood pressure's lower number is above 100, the case will be rescheduled. Blood thinners: Make sure to stop your blood thinners as instructed.  If you take a blood thinner, but were not instructed to stop it, call our office [phone] and ask to talk to a nurse. Not stopping a blood thinner prior to certain procedures could lead to serious complications. Diabetics on insulin: Notify the staff so that you can be scheduled 1st case in the morning. If your diabetes requires high dose insulin, take only  of your normal insulin dose the morning of the procedure and notify the staff that you have done so. Preventing infections: Shower with an antibacterial soap the morning of your procedure.  Build-up your immune system: Take 1000 mg of Vitamin C with every meal (3 times a day) the day prior to your procedure. Antibiotics: Inform the nursing staff if you are taking any antibiotics or if you have any conditions that may require antibiotics prior to procedures. (Example: recent joint implants)   Pregnancy: If you are pregnant make sure to notify the nursing staff. Not doing so may result in injury to the fetus, including death.  Sickness: If  you have a cold, fever, or any active infections, call and cancel or reschedule your procedure. Receiving steroids while having an infection may result in complications. Arrival: You must be in the facility at least 30 minutes prior to your scheduled procedure. Tardiness: Your scheduled time is also the cutoff time. If you do not arrive at least 15 minutes prior to your procedure, you will be rescheduled.  Children: Do not bring any children with you. Make arrangements to keep them home. Dress appropriately: There is always a possibility that your clothing may get soiled. Avoid long dresses. Valuables: Do not bring any jewelry or valuables.  Reasons to call and reschedule or cancel your procedure: (Following these recommendations will minimize the risk of a serious complication.) Surgeries: Avoid having procedures within 2 weeks of any surgery. (Avoid for 2 weeks before or after any surgery). Flu Shots: Avoid having procedures within 2 weeks of a flu shots or . (Avoid for 2 weeks before or after immunizations). Barium: Avoid having a procedure within 7-10 days after having had a radiological study involving the use of radiological contrast. (Myelograms, Barium swallow or enema study). Heart attacks: Avoid any elective procedures or surgeries for the initial 6 months after a "Myocardial Infarction" (Heart Attack). Blood thinners: It is imperative that you stop these medications before procedures. Let us know if you if you take any blood thinner.  Infection: Avoid procedures during or within two weeks of an infection (including chest colds or gastrointestinal problems). Symptoms associated with infections include: Localized redness, fever, chills, night sweats or profuse sweating, burning sensation when voiding, cough, congestion, stuffiness, runny nose, sore throat, diarrhea, nausea, vomiting,  cold or Flu symptoms, recent or current infections. It is specially important if the infection is over the area  that we intend to treat. Heart and lung problems: Symptoms that may suggest an active cardiopulmonary problem include: cough, chest pain, breathing difficulties or shortness of breath, dizziness, ankle swelling, uncontrolled high or unusually low blood pressure, and/or palpitations. If you are experiencing any of these symptoms, cancel your procedure and contact your primary care physician for an evaluation.  Remember:  Regular Business hours are:  Monday to Thursday 8:00 AM to 4:00 PM  Provider's Schedule: Milinda Pointer, MD:  Procedure days: Tuesday and Thursday 7:30 AM to 4:00 PM  Gillis Santa, MD:  Procedure days: Monday and Wednesday 7:30 AM to 4:00 PM  ______________________________________________________________________  Radiofrequency Ablation Radiofrequency ablation is a procedure that is performed to relieve pain. The procedure is often used for back, neck, or arm pain. Radiofrequency ablation involves the use of a machine that creates radio waves to make heat. During the procedure, the heat is applied to the nerve that carries the pain signal. The heat damages the nerve and interferes with the pain signal. Pain relief usually starts about 2 weeks after the procedure and lasts for 6 months to 1 year. Tell a health care provider about: Any allergies you have. All medicines you are taking, including vitamins, herbs, eye drops, creams, and over-the-counter medicines. Any problems you or family members have had with anesthetic medicines. Any bleeding problems you have. Any surgeries you have had. Any medical conditions you have. Whether you are pregnant or may be pregnant. What are the risks? Generally, this is a safe procedure. However, problems may occur, including: Pain or soreness at the injection site. Allergic reaction to medicines given during the procedure. Bleeding. Infection at the injection site. Damage to nerves or blood vessels. What happens before the  procedure? When to stop eating and drinking Follow instructions from your health care provider about what you may eat and drink before your procedure. These may include: 8 hours before the procedure Stop eating most foods. Do not eat meat, fried foods, or fatty foods. Eat only light foods, such as toast or crackers. All liquids are okay except energy drinks and alcohol. 6 hours before the procedure Stop eating. Drink only clear liquids, such as water, clear fruit juice, black coffee, plain tea, and sports drinks. Do not drink energy drinks or alcohol. 2 hours before the procedure Stop drinking all liquids. You may be allowed to take medicine with small sips of water. If you do not follow your health care provider's instructions, your procedure may be delayed or canceled. Medicines Ask your health care provider about: Changing or stopping your regular medicines. This is especially important if you are taking diabetes medicines or blood thinners. Taking medicines such as aspirin and ibuprofen. These medicines can thin your blood. Do not take these medicines unless your health care provider tells you to take them. Taking over-the-counter medicines, vitamins, herbs, and supplements. General instructions Ask your health care provider what steps will be taken to help prevent infection. These steps may include: Removing hair at the procedure site. Washing skin with a germ-killing soap. Taking antibiotic medicine. If you will be going home right after the procedure, plan to have a responsible adult: Take you home from the hospital or clinic. You will not be allowed to drive. Care for you for the time you are told. What happens during the procedure?  You will be awake during the procedure. You  will need to be able to talk with the health care provider during the procedure. An IV will be inserted into one of your veins. You will be given one or more of the following: A medicine to help you  relax (sedative). A medicine to numb the area (local anesthetic). Your health care provider will insert a radiofrequency needle into the area to be treated. This is done with the help of fluoroscopy. A wire that carries the radio waves (electrode) will be put through the radiofrequency needle. An electrical pulse will be sent through the electrode to verify the correct nerve that is causing your pain. You will feel a tingling sensation, and you may have muscle twitching. The tissue around the needle tip will be heated by an electric current that comes from the radiofrequency machine. This will numb the nerves. The needle will be removed. A bandage (dressing) will be put on the insertion area. The procedure may vary among health care providers and hospitals. What happens after the procedure? Your blood pressure, heart rate, breathing rate, and blood oxygen level will be monitored until you leave the hospital or clinic. Return to your normal activities as told by your health care provider. Ask your health care provider what activities are safe for you. If you were given a sedative during the procedure, it can affect you for several hours. Do not drive or operate machinery until your health care provider says that it is safe. Summary Radiofrequency ablation is a procedure that is performed to relieve pain. The procedure is often used for back, neck, or arm pain. Radiofrequency ablation involves the use of a machine that creates radio waves to make heat. Plan to have a responsible adult take you home from the hospital or clinic. Do not drive or operate machinery until your health care provider says that it is safe. Return to your normal activities as told by your health care provider. Ask your health care provider what activities are safe for you. This information is not intended to replace advice given to you by your health care provider. Make sure you discuss any questions you have with your health  care provider. Document Revised: 10/04/2020 Document Reviewed: 10/04/2020 Elsevier Patient Education  [place]. Knee Injection A knee injection is a procedure to get medicine into your knee joint to relieve the pain, swelling, and stiffness of arthritis. Your health care provider uses a needle to inject medicine, which may also help to lubricate and cushion your knee joint. You may need more than one injection. Tell a health care provider about: Any allergies you have. All medicines you are taking, including vitamins, herbs, eye drops, creams, and over-the-counter medicines. Any problems you or family members have had with anesthetic medicines. Any blood disorders you have. Any surgeries you have had. Any medical conditions you have. Whether you are pregnant or may be pregnant. What are the risks? Generally, this is a safe procedure. However, problems may occur, including: Infection. Bleeding. Symptoms that get worse. Damage to the area around your knee. Allergic reaction to any of the medicines. Skin reactions from repeated injections. What happens before the procedure? Ask your health care provider about: Changing or stopping your regular medicines. This is especially important if you are taking diabetes medicines or blood thinners. Taking medicines such as aspirin and ibuprofen. These medicines can thin your blood. Do not take these medicines unless your health care provider tells you to take them. Taking over-the-counter medicines, vitamins, herbs, and supplements. Plan  to have a responsible adult take you home from the hospital or clinic. What happens during the procedure?  You will sit or lie down in a position for your knee to be treated. The skin over your kneecap will be cleaned with a germ-killing soap. You will be given a medicine that numbs the area (local anesthetic). You may feel some stinging. The medicine will be injected into your knee. The needle is  carefully placed between your kneecap and your knee. The medicine is injected into the joint space. The needle will be removed at the end of the procedure. A bandage (dressing) may be placed over the injection site. The procedure may vary among health care providers and hospitals. What can I expect after the procedure? Your blood pressure, heart rate, breathing rate, and blood oxygen level will be monitored until you leave the hospital or clinic. You may have to move your knee through its full range of motion. This helps to get all the medicine into your joint space. You will be watched to make sure that you do not have a reaction to the injected medicine. You may feel more pain, swelling, and warmth than you did before the injection. This reaction may last about 1-2 days. Follow these instructions at home: Medicines Take over-the-counter and prescription medicines only as told by your health care provider. Ask your health care provider if the medicine prescribed to you requires you to avoid driving or using machinery. Do not take medicines such as aspirin and ibuprofen unless your health care provider tells you to take them. Injection site care Follow instructions from your health care provider about: How to take care of your puncture site. When and how you should change your dressing. When you should remove your dressing. Check your injection area every day for signs of infection. Check for: More redness, swelling, or pain after 2 days. Fluid or blood. Pus or a bad smell. Warmth. Managing pain, stiffness, and swelling  If directed, put ice on the injection area. To do this: Put ice in a plastic bag. Place a towel between your skin and the bag. Leave the ice on for 20 minutes, 2-3 times per day. Remove the ice if your skin turns bright red. This is very important. If you cannot feel pain, heat, or cold, you have a greater risk of damage to the area. Do not apply heat to your  knee. Raise (elevate) the injection area above the level of your heart while you are sitting or lying down. General instructions If you were given a dressing, keep it dry until your health care provider says it can be removed. Ask your health care provider when you can start showering or bathing. Avoid strenuous activities for as long as directed by your health care provider. Ask your health care provider when you can return to your normal activities. Keep all follow-up visits. This is important. You may need more injections. Contact a health care provider if you have: A fever. Warmth in your injection area. Fluid, blood, or pus coming from your injection site. Symptoms at your injection site that last longer than 2 days after your procedure. Get help right away if: Your knee turns very red. Your knee becomes very swollen. Your knee is in severe pain. Summary A knee injection is a procedure to get medicine into your knee joint to relieve the pain, swelling, and stiffness of arthritis. A needle is carefully placed between your kneecap and your knee to inject medicine into  the joint space. Before the procedure, ask your health care provider about changing or stopping your regular medicines, especially if you are taking diabetes medicines or blood thinners. Contact your health care provider if you have any problems or questions after your procedure. This information is not intended to replace advice given to you by your health care provider. Make sure you discuss any questions you have with your health care provider. Document Revised: 09/30/2019 Document Reviewed: 09/30/2019 Elsevier Patient Education  [place].

## 2022-07-18 ENCOUNTER — Other Ambulatory Visit: Payer: Self-pay | Admitting: Physician Assistant

## 2022-07-18 DIAGNOSIS — Z1231 Encounter for screening mammogram for malignant neoplasm of breast: Secondary | ICD-10-CM

## 2022-07-21 ENCOUNTER — Ambulatory Visit
Admission: EM | Admit: 2022-07-21 | Discharge: 2022-07-21 | Disposition: A | Discharge status: Home / Self Care | Payer: Managed Care, Other (non HMO) | Attending: Emergency Medicine | Admitting: Emergency Medicine

## 2022-07-21 DIAGNOSIS — R052 Subacute cough: Secondary | ICD-10-CM | POA: Diagnosis not present

## 2022-07-21 DIAGNOSIS — R21 Rash and other nonspecific skin eruption: Secondary | ICD-10-CM | POA: Diagnosis not present

## 2022-07-21 DIAGNOSIS — B372 Candidiasis of skin and nail: Secondary | ICD-10-CM | POA: Diagnosis not present

## 2022-07-21 DIAGNOSIS — N898 Other specified noninflammatory disorders of vagina: Secondary | ICD-10-CM | POA: Diagnosis not present

## 2022-07-21 LAB — POCT URINALYSIS DIP (MANUAL ENTRY)
Bilirubin, UA: NEGATIVE
Glucose, UA: NEGATIVE mg/dL
Nitrite, UA: NEGATIVE
Protein Ur, POC: 30 mg/dL — AB
Spec Grav, UA: >=1.03 — AB (ref 1.010–1.025)
Urobilinogen, UA: 0.2 E.U./dL
pH, UA: 6 (ref 5.0–8.0)

## 2022-07-21 LAB — POCT URINE PREGNANCY: Preg Test, Ur: NEGATIVE

## 2022-07-21 MED ORDER — NYSTATIN 100000 UNIT/GM EX CREA: TOPICAL_CREAM | CUTANEOUS | 2 refills | Status: AC

## 2022-07-21 MED ORDER — FLUCONAZOLE 150 MG PO TABS: 150.0000 mg | ORAL_TABLET | Freq: Every day | ORAL | 0 refills | Status: AC

## 2022-07-21 NOTE — Discharge Instructions (Addendum)
Take the Diflucan and use the Nystatin cream as directed.      Your vaginal tests are pending.  If your test results are positive, we will call you.  You may require treatment at that time.  Do not have sexual activity for at least 7 days.    Apply hydrocortisone cream to the rash on your neck.  Take Zyrtec or Benadryl as needed for itching.   Follow up with your primary care provider.

## 2022-07-21 NOTE — ED Provider Notes (Signed)
Roderic Palau    CSN: [id number] Arrival date & time: 07/21/22  1025      History   Chief Complaint Chief Complaint  Patient presents with   Vaginal Discharge   Rash    HPI Danielle Lin is a 47 y.o. female.  Patient presents with 2 day history of vaginal irritation and rash.  She has scant vaginal discharge.  She was recently on an antibiotic and believes this is likely a yeast infection.  Treatment attempted with OTC Monostat cream.  She is not sexually active.  Patient also presents with pruritic rash on her ears and neck today.  Patient also presents with lingering nonproductive cough from recent URI.  She was seen by her PCP and treated with amoxicillin.  Patient denies fever, chills, ear pain, sore throat, shortness of breath, chest pain, abdominal pain, dysuria, vomiting, diarrhea, flank pain, pelvic pain, or other symptoms.  Her medical history includes diabetes, hypertension, asthma, GERD, anemia, chronic knee pain, anxiety, depression.   The history is provided by the patient and medical records.    Past Medical History:  Diagnosis Date   Anxiety    Arthritis    Asthma    Depression    Diabetes mellitus type 2, uncomplicated ([place])    pt reports pre DM before weight lose surgery   GERD without esophagitis    Heavy menses    Hypertension    IDA (iron deficiency anemia)    Obstructive sleep apnea on CPAP     Patient Active Problem List   Diagnosis Date Noted   Chronic pain of left knee 06/19/2022   Status post arthroscopic surgery of left knee 06/19/2022   Lymphedema 06/19/2022   Iron deficiency anemia due to chronic blood loss 08/13/2018    Past Surgical History:  Procedure Laterality Date   arm surgery Left    BARIATRIC SURGERY     CARPAL TUNNEL RELEASE Bilateral    CHOLECYSTECTOMY     GASTROPLASTY DUODENAL SWITCH     KNEE ARTHROSCOPY Left 02/19/2022   Procedure: ARTHROSCOPY KNEE CHONDRAL DEBRIDEMENT MEDIAL AND LATERAL COMPARTMENT;  Surgeon:  Dorna Leitz, MD;  Location: [facility];  Service: Orthopedics;  Laterality: Left;    OB History   No obstetric history on file.      Home Medications    Prior to Admission medications   Medication Sig Start Date End Date Taking? Authorizing Provider  benzonatate (TESSALON) 200 MG capsule Take 200 mg by mouth 3 (three) times daily as needed. 07/02/22  Yes [provider]  fluconazole (DIFLUCAN) 150 MG tablet Take 1 tablet (150 mg total) by mouth daily. Take one tablet today.  May repeat in 3 days. 07/21/22  Yes Sharion Balloon, NP  nystatin cream (MYCOSTATIN) Apply to affected area 2 times daily 07/21/22  Yes Sharion Balloon, NP  tiZANidine (ZANAFLEX) 4 MG tablet Take 4 mg by mouth 2 (two) times daily as needed. 06/14/22  Yes [provider]  acetaminophen (TYLENOL) 500 MG tablet Take 1,000 mg by mouth every 6 (six) hours as needed for moderate pain.    [provider]  albuterol (VENTOLIN HFA) 108 (90 Base) MCG/ACT inhaler Inhale 1 puff into the lungs every 6 (six) hours as needed for wheezing or shortness of breath.    [provider]  buPROPion (WELLBUTRIN XL) 300 MG 24 hr tablet Take 300 mg by mouth daily. 07/27/18   [provider]  CALCIUM-VITAMIN D PO Take 2 tablets by mouth daily.  [provider]  citalopram (CELEXA) 40 MG tablet Take 40 mg by mouth daily.    [provider]  Cyanocobalamin (B-12 SL) Place 1 tablet under the tongue daily.    [provider]  furosemide (LASIX) 20 MG tablet Take 20 mg by mouth daily.    [provider]  hydrOXYzine (ATARAX) 25 MG tablet Take 25-50 mg by mouth daily. 06/09/21   [provider]  Iron-Vit C-Vit B12-Folic Acid 123XX123 MG TABS Take 1 tablet by mouth daily. 07/11/18   Carrie Mew, MD  levonorgestrel (LILETTA) 19.5 MCG/DAY IUD IUD 1 each by Intrauterine route once. 09/08/18 09/07/24  [provider]  meloxicam (MOBIC) 15 MG tablet Take 15 mg  by mouth daily.    [provider]  Multiple Vitamin (MULTI-VITAMIN) tablet Take 1 tablet by mouth daily.    [provider]  NON FORMULARY Pt uses a cpap nightly    [provider]  omeprazole (PRILOSEC) 20 MG capsule Take 20 mg by mouth 2 (two) times daily before a meal.    [provider]  ondansetron (ZOFRAN) 4 MG tablet Take 4 mg by mouth every 8 (eight) hours as needed for vomiting or nausea. 07/22/15   [provider]  traMADol (ULTRAM) 50 MG tablet Take by mouth every 6 (six) hours as needed.    [provider]    Family History Family History  Problem Relation Age of Onset   Bladder Cancer Mother    Uterine cancer Mother    Breast cancer Maternal Grandmother     Social History Social History   Tobacco Use   Smoking status: Never   Smokeless tobacco: Never  Vaping Use   Vaping Use: Never used  Substance Use Topics   Alcohol use: No   Drug use: No     Allergies   Tetanus toxoid and Tetanus-diphtheria toxoids td   Review of Systems Review of Systems  Constitutional:  Negative for chills and fever.  HENT:  Negative for ear pain and sore throat.   Respiratory:  Negative for cough and shortness of breath.   Cardiovascular:  Negative for chest pain and palpitations.  Gastrointestinal:  Negative for abdominal pain, diarrhea, nausea and vomiting.  Genitourinary:  Positive for vaginal discharge. Negative for dysuria, flank pain, hematuria and pelvic pain.  Skin:  Positive for rash. Negative for color change.  All other systems reviewed and are negative.    Physical Exam Triage Vital Signs ED Triage Vitals  Enc Vitals Group     BP      Pulse      Resp      Temp      Temp src      SpO2      Weight      Height      Head Circumference      Peak Flow      Pain Score      Pain Loc      Pain Edu?      Excl. in [place]?    No data found.  Updated Vital Signs BP 139/84   Pulse 93   Temp 97.8 F (36.6 C)    Resp 18   SpO2 97%   Visual Acuity Right Eye Distance:   Left Eye Distance:   Bilateral Distance:    Right Eye Near:   Left Eye Near:    Bilateral Near:     Physical Exam Vitals and nursing note reviewed. Exam conducted with  a chaperone present Veterinary surgeon, Therapist, sports).  Constitutional:      General: She is not in acute distress.    Appearance: She is well-developed. She is obese. She is not ill-appearing.  HENT:     Right Ear: Tympanic membrane normal.     Left Ear: Tympanic membrane normal.     Nose: Nose normal.     Mouth/Throat:     Mouth: Mucous membranes are moist.     Pharynx: Oropharynx is clear.  Cardiovascular:     Rate and Rhythm: Normal rate and regular rhythm.     Heart sounds: Normal heart sounds.  Pulmonary:     Effort: Pulmonary effort is normal. No respiratory distress.     Breath sounds: Normal breath sounds.  Abdominal:     General: Bowel sounds are normal.     Palpations: Abdomen is soft.     Tenderness: There is no abdominal tenderness. There is no right CVA tenderness, left CVA tenderness, guarding or rebound.  Genitourinary:    Exam position: Lithotomy position.     Labia:        Right: Rash present.        Left: Rash present.      Comments: Erythematous rash on labia and perineum and gluteal cleft.  Scant white vaginal discharge.  Musculoskeletal:     Cervical back: Neck supple.  Skin:    General: Skin is warm and dry.     Findings: Rash present.  Neurological:     Mental Status: She is alert.  Psychiatric:        Mood and Affect: Mood normal.        Behavior: Behavior normal.      UC Treatments / Results  Labs (all labs ordered are listed, but only abnormal results are displayed) Labs Reviewed  POCT URINALYSIS DIP (MANUAL ENTRY) - Abnormal; Notable for the following components:      Result Value   Ketones, POC UA trace (5) (*)    Spec Grav, UA >=1.030 (*)    Blood, UA small (*)    Protein Ur, POC =30 (*)    Leukocytes, UA Trace (*)    All  other components within normal limits  POCT URINE PREGNANCY  CERVICOVAGINAL ANCILLARY ONLY    EKG   Radiology No results found.  Procedures Procedures (including critical care time)  Medications Ordered in UC Medications - No data to display  Initial Impression / Assessment and Plan / UC Course  I have reviewed the triage vital signs and the nursing notes.  Pertinent labs & imaging results that were available during my care of the patient were reviewed by me and considered in my medical decision making (see chart for details).    Vaginal discharge, candidal dermatitis, rash on neck, subacute cough.  Patient has candidal rash from her labia to her gluteal cleft.  Treating with Diflucan and Nystatin cream.  Vaginal swab pending.  Treating the rash on her neck with OTC hydrocortisone cream and antihistamine.  Her cough is improving and her lungs are clear.  Afebrile, VSS.  Instructed patient to follow up with her PCP next week.  Education provided on skin yeast infection, rash, and cough.  She agrees to plan of care.    Final Clinical Impressions(s) / UC Diagnoses   Final diagnoses:  Vaginal discharge  Candidal dermatitis  Rash  Subacute cough     Discharge Instructions      Take the Diflucan and use the Nystatin cream as directed.  Your vaginal tests are pending.  If your test results are positive, we will call you.  You may require treatment at that time.  Do not have sexual activity for at least 7 days.    Apply hydrocortisone cream to the rash on your neck.  Take Zyrtec or Benadryl as needed for itching.   Follow up with your primary care provider.          ED Prescriptions     Medication Sig Dispense Auth. Provider   fluconazole (DIFLUCAN) 150 MG tablet Take 1 tablet (150 mg total) by mouth daily. Take one tablet today.  May repeat in 3 days. 2 tablet Sharion Balloon, NP   nystatin cream (MYCOSTATIN) Apply to affected area 2 times daily 30 g Sharion Balloon,  NP      PDMP not reviewed this encounter.   Sharion Balloon, NP 07/21/22 1110

## 2022-07-21 NOTE — ED Triage Notes (Signed)
Patient to Urgent Care with complaints of vaginal discharged that started two days ago. Expressed concerns about potential yeast infection following completion of 10 days of amoxicillin.    Also presents with complaints about rash present to ears and back of neck that started today. Describes rash as itchy.

## 2022-07-23 LAB — CERVICOVAGINAL ANCILLARY ONLY
Bacterial Vaginitis (gardnerella): NEGATIVE
Candida Glabrata: POSITIVE — AB
Candida Vaginitis: POSITIVE — AB
Chlamydia: NEGATIVE
Comment: NEGATIVE
Comment: NEGATIVE
Comment: NEGATIVE
Comment: NEGATIVE
Comment: NEGATIVE
Comment: NORMAL
Neisseria Gonorrhea: NEGATIVE
Trichomonas: NEGATIVE

## 2022-09-04 ENCOUNTER — Ambulatory Visit
Admission: RE | Admit: 2022-09-04 | Discharge: 2022-09-04 | Disposition: A | Discharge status: Home / Self Care | Payer: Managed Care, Other (non HMO) | Source: Ambulatory Visit | Attending: Physician Assistant | Admitting: Physician Assistant

## 2022-09-04 DIAGNOSIS — Z1231 Encounter for screening mammogram for malignant neoplasm of breast: Secondary | ICD-10-CM

## 2022-09-27 IMAGING — MG MM DIGITAL SCREENING BILAT W/ TOMO AND CAD
8 series · 9 of 24 positions shown · non-contrast
Comparison: Previous exam(s).

CLINICAL DATA: Screening.

EXAM:
DIGITAL SCREENING BILATERAL MAMMOGRAM WITH TOMOSYNTHESIS AND CAD
TECHNIQUE: Bilateral screening digital craniocaudal and mediolateral oblique
mammograms were obtained. Bilateral screening digital breast
tomosynthesis was performed. The images were evaluated with
computer-aided detection.

[R MLO synth-2D]
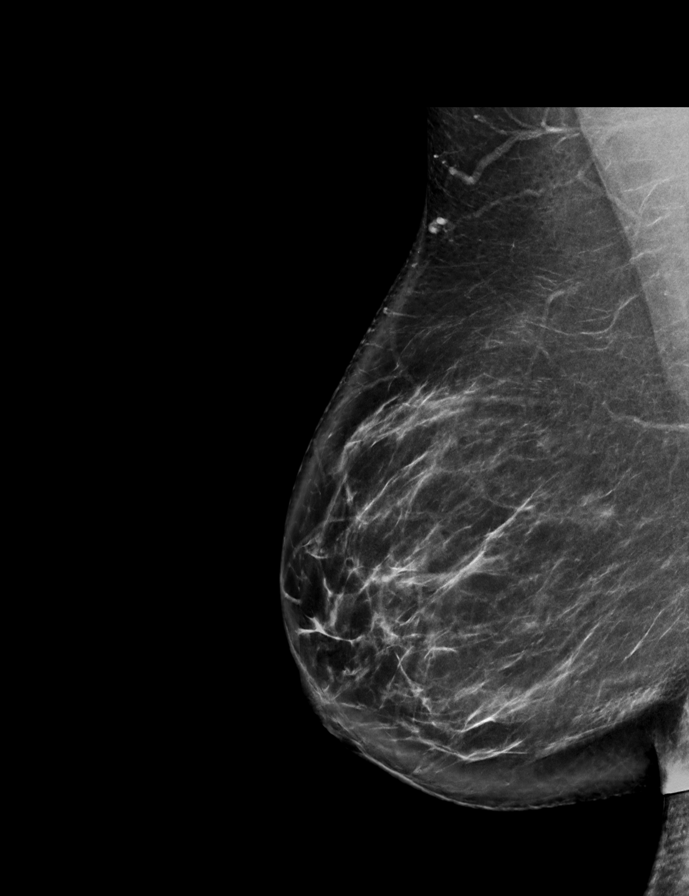

[R CC synth-2D]
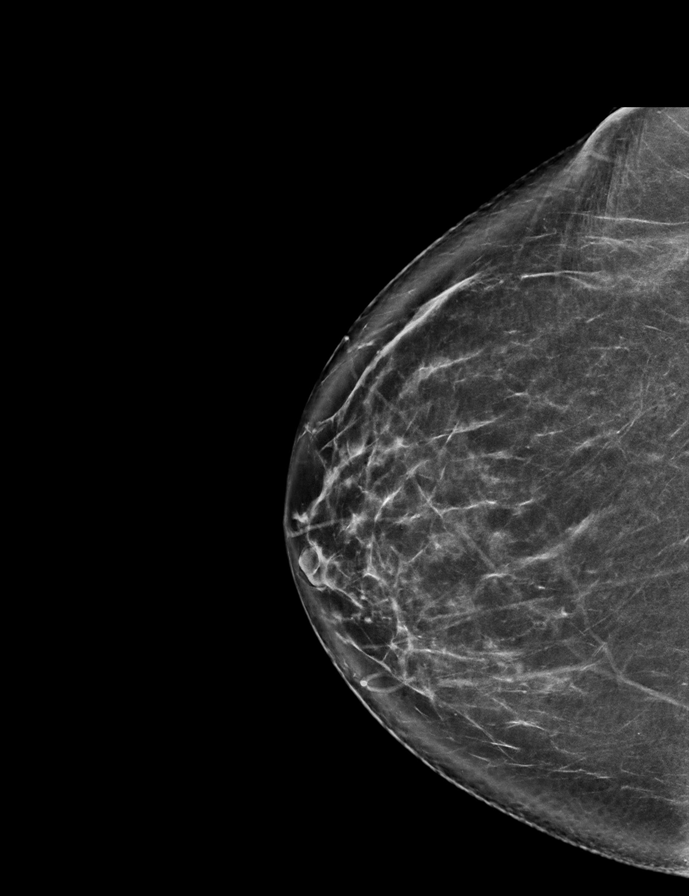

[L MLO synth-2D]
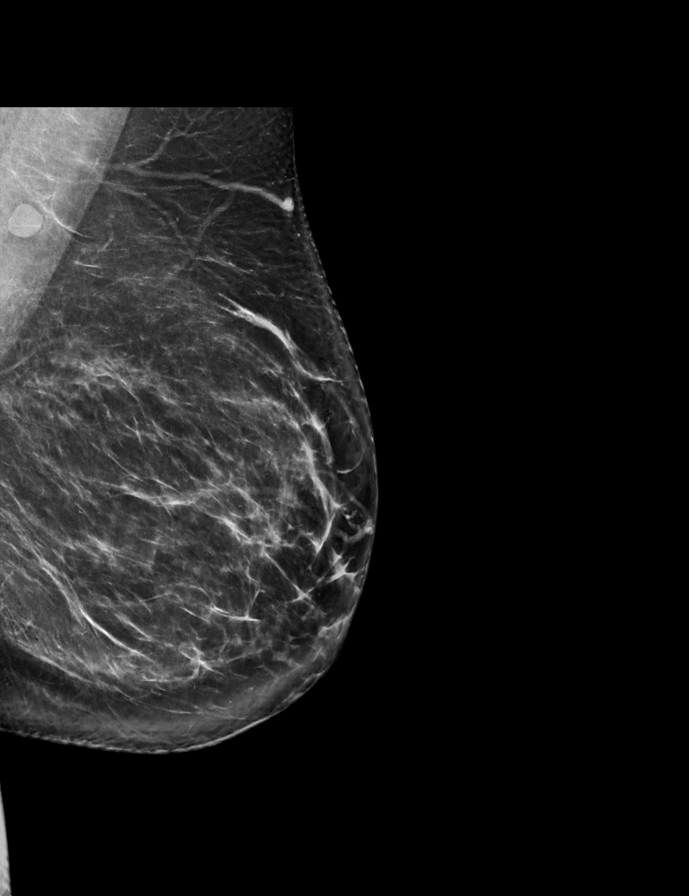

[L CC synth-2D]
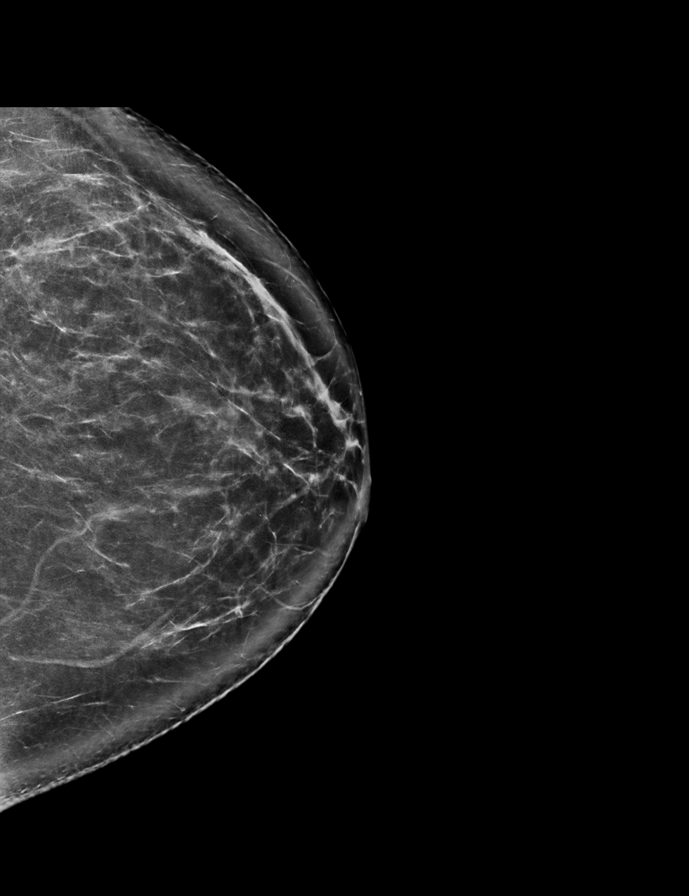

[L CC tomo · 2 of 85 frames shown]
[frame 28/85]
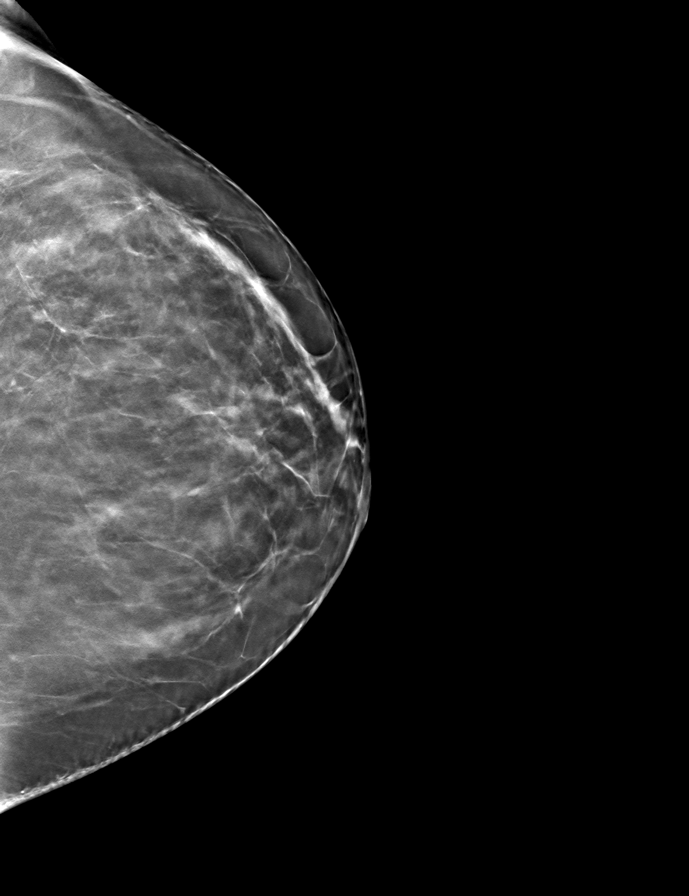
[frame 43/85]
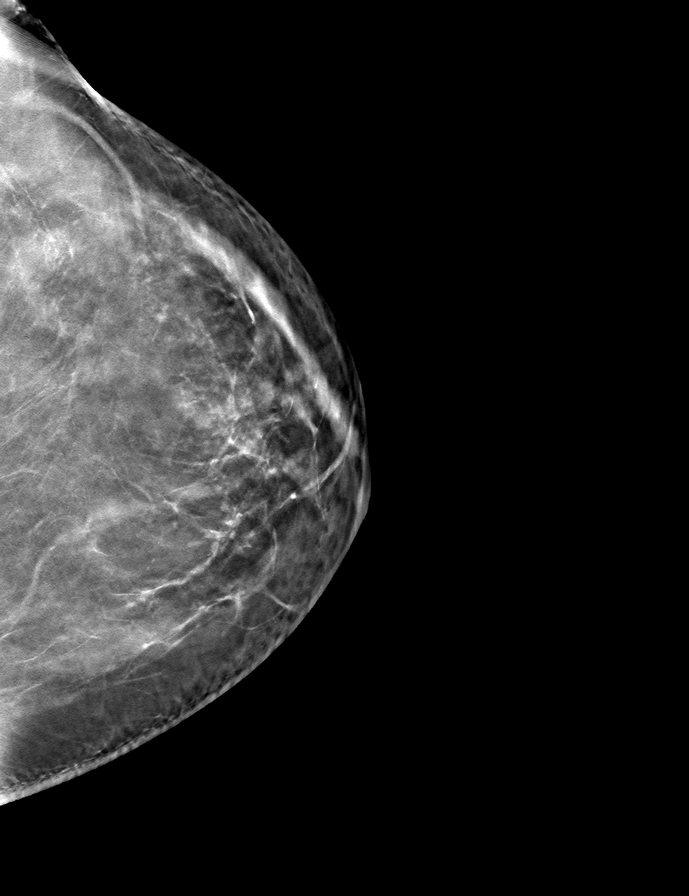

[R MLO tomo · tomo slice 44/87.0]
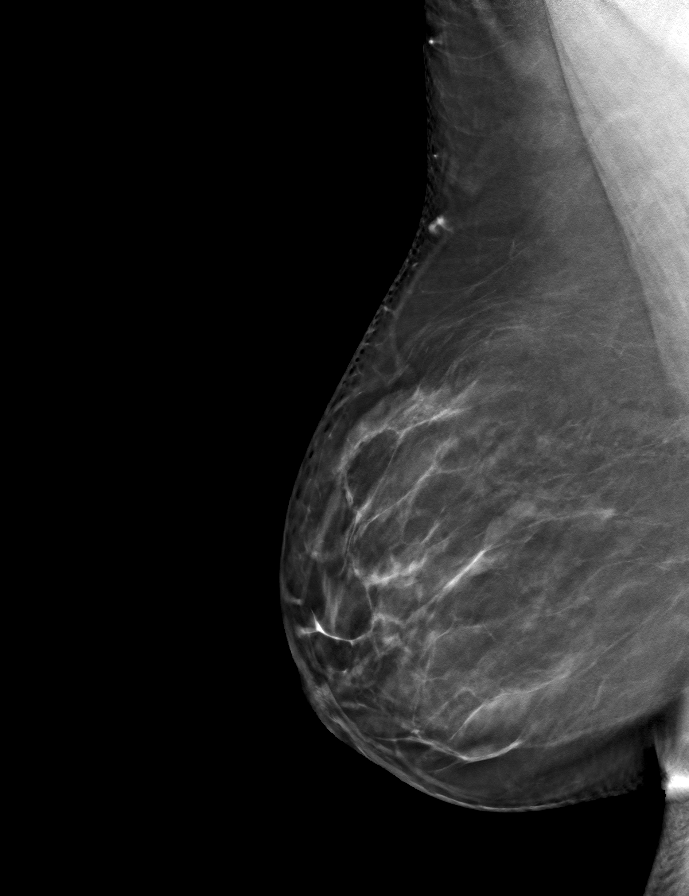

[L MLO tomo · tomo slice 43/85.0]
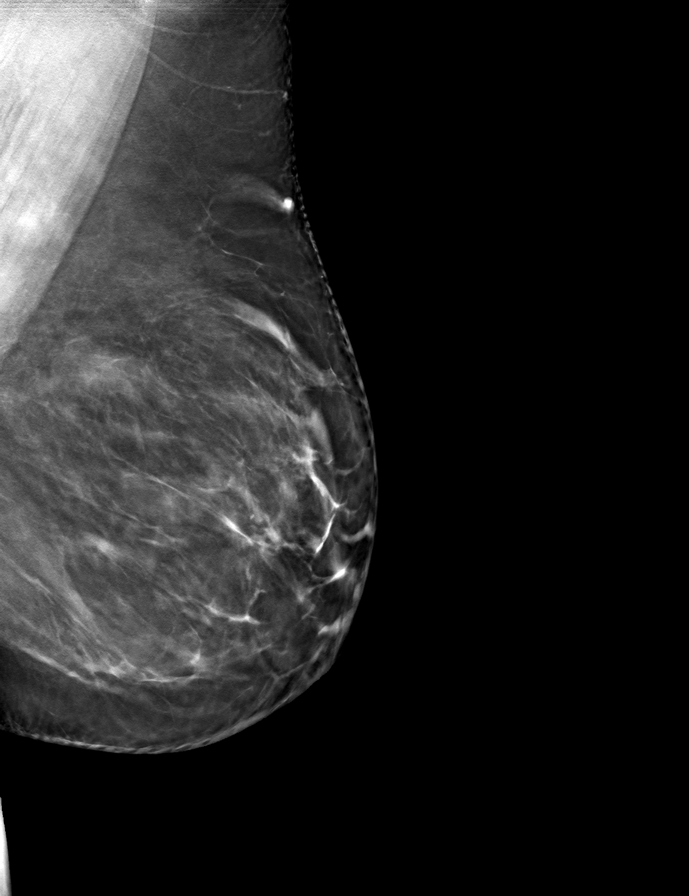

[R CC tomo · tomo slice 40/79.0]
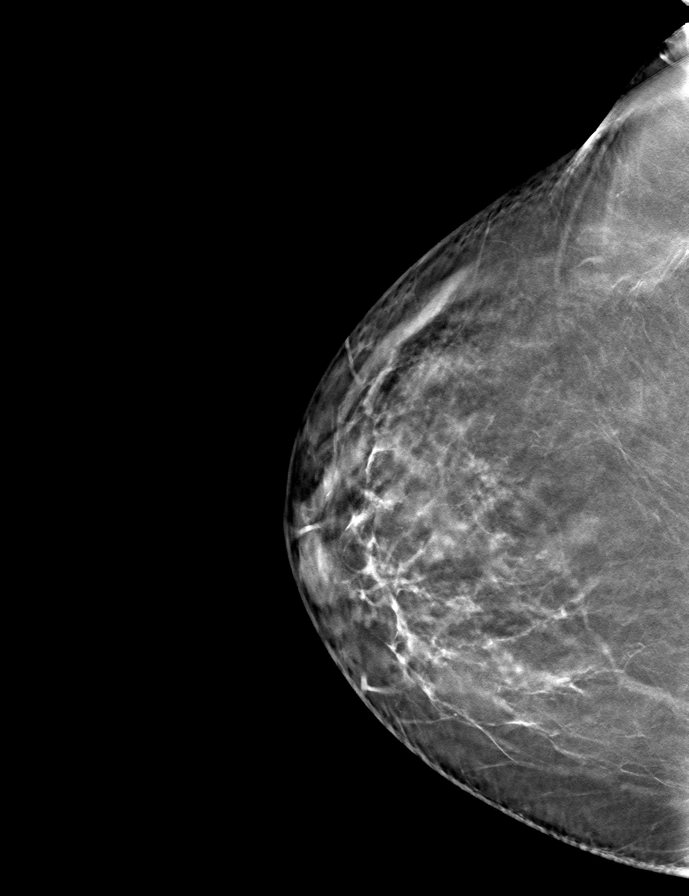

[9 of 24 positions shown; findings below may reference images not displayed]

ACR Breast Density Category b: There are scattered areas of
fibroglandular density.
FINDINGS: There are no findings suspicious for malignancy.
IMPRESSION: No mammographic evidence of malignancy. A result letter of this
screening mammogram will be mailed directly to the patient.

RECOMMENDATION:
Screening mammogram in one year. (Code:51-O-LD2)

BI-RADS CATEGORY  1: Negative.

## 2023-03-21 ENCOUNTER — Other Ambulatory Visit: Payer: Self-pay | Admitting: Neurology

## 2023-03-21 DIAGNOSIS — M542 Cervicalgia: Secondary | ICD-10-CM

## 2023-04-25 ENCOUNTER — Encounter: Payer: Self-pay | Admitting: Neurology

## 2023-04-26 ENCOUNTER — Ambulatory Visit
Admission: RE | Admit: 2023-04-26 | Discharge: 2023-04-26 | Disposition: A | Discharge status: Home / Self Care | Payer: Managed Care, Other (non HMO) | Source: Ambulatory Visit | Attending: Neurology | Admitting: Neurology

## 2023-04-26 DIAGNOSIS — M542 Cervicalgia: Secondary | ICD-10-CM

## 2023-04-29 ENCOUNTER — Other Ambulatory Visit: Payer: Managed Care, Other (non HMO)

## 2023-05-09 ENCOUNTER — Other Ambulatory Visit: Payer: Managed Care, Other (non HMO)

## 2023-06-13 ENCOUNTER — Other Ambulatory Visit: Payer: Self-pay | Admitting: Gastroenterology

## 2023-06-19 ENCOUNTER — Ambulatory Visit (HOSPITAL_BASED_OUTPATIENT_CLINIC_OR_DEPARTMENT_OTHER): Payer: Managed Care, Other (non HMO) | Admitting: Anesthesiology

## 2023-06-19 ENCOUNTER — Ambulatory Visit (HOSPITAL_COMMUNITY)
Admission: RE | Admit: 2023-06-19 | Discharge: 2023-06-19 | Disposition: A | Discharge status: Home / Self Care | Payer: Managed Care, Other (non HMO) | Source: Ambulatory Visit | Attending: Gastroenterology | Admitting: Gastroenterology

## 2023-06-19 ENCOUNTER — Encounter (HOSPITAL_COMMUNITY): Payer: Self-pay | Admitting: Gastroenterology

## 2023-06-19 ENCOUNTER — Encounter (HOSPITAL_COMMUNITY)
Admission: RE | Disposition: A | Discharge status: Home / Self Care | Payer: Self-pay | Source: Ambulatory Visit | Attending: Gastroenterology

## 2023-06-19 ENCOUNTER — Ambulatory Visit (HOSPITAL_COMMUNITY): Payer: Self-pay | Admitting: Anesthesiology

## 2023-06-19 DIAGNOSIS — Z1211 Encounter for screening for malignant neoplasm of colon: Secondary | ICD-10-CM | POA: Insufficient documentation

## 2023-06-19 DIAGNOSIS — G4733 Obstructive sleep apnea (adult) (pediatric): Secondary | ICD-10-CM | POA: Diagnosis not present

## 2023-06-19 DIAGNOSIS — E6689 Other obesity not elsewhere classified: Secondary | ICD-10-CM | POA: Diagnosis not present

## 2023-06-19 DIAGNOSIS — J45909 Unspecified asthma, uncomplicated: Secondary | ICD-10-CM | POA: Insufficient documentation

## 2023-06-19 DIAGNOSIS — D123 Benign neoplasm of transverse colon: Secondary | ICD-10-CM

## 2023-06-19 DIAGNOSIS — Z6841 Body Mass Index (BMI) 40.0 and over, adult: Secondary | ICD-10-CM | POA: Diagnosis not present

## 2023-06-19 DIAGNOSIS — Z83719 Family history of colon polyps, unspecified: Secondary | ICD-10-CM | POA: Diagnosis not present

## 2023-06-19 DIAGNOSIS — E119 Type 2 diabetes mellitus without complications: Secondary | ICD-10-CM | POA: Diagnosis not present

## 2023-06-19 DIAGNOSIS — Z800 Family history of malignant neoplasm of digestive organs: Secondary | ICD-10-CM | POA: Diagnosis not present

## 2023-06-19 DIAGNOSIS — I10 Essential (primary) hypertension: Secondary | ICD-10-CM | POA: Insufficient documentation

## 2023-06-19 DIAGNOSIS — K219 Gastro-esophageal reflux disease without esophagitis: Secondary | ICD-10-CM | POA: Diagnosis not present

## 2023-06-19 DIAGNOSIS — Z9884 Bariatric surgery status: Secondary | ICD-10-CM | POA: Insufficient documentation

## 2023-06-19 HISTORY — PX: BIOPSY: SHX5522

## 2023-06-19 HISTORY — PX: COLONOSCOPY WITH PROPOFOL: SHX5780

## 2023-06-19 LAB — GLUCOSE, CAPILLARY: Glucose-Capillary: 108 mg/dL — ABNORMAL HIGH (ref 70–99)

## 2023-06-19 SURGERY — COLONOSCOPY WITH PROPOFOL
Anesthesia: Monitor Anesthesia Care

## 2023-06-19 MED ORDER — OXYCODONE HCL 5 MG PO TABS: 5.0000 mg | ORAL_TABLET | Freq: Once | ORAL | Status: DC | PRN

## 2023-06-19 MED ORDER — PROPOFOL 500 MG/50ML IV EMUL
INTRAVENOUS | Status: DC | PRN
  Administered 2023-06-19: 100 ug/kg/min via INTRAVENOUS

## 2023-06-19 MED ORDER — LACTATED RINGERS IV SOLN: INTRAVENOUS | Status: DC | PRN

## 2023-06-19 MED ORDER — HYDROMORPHONE HCL 1 MG/ML IJ SOLN: 0.2500 mg | INTRAMUSCULAR | Status: DC | PRN

## 2023-06-19 MED ORDER — SODIUM CHLORIDE 0.9 % IV SOLN: 12.5000 mg | INTRAVENOUS | Status: DC | PRN

## 2023-06-19 MED ORDER — OXYCODONE HCL 5 MG/5ML PO SOLN: 5.0000 mg | Freq: Once | ORAL | Status: DC | PRN

## 2023-06-19 MED ORDER — PROPOFOL 10 MG/ML IV BOLUS
INTRAVENOUS | Status: DC | PRN
  Administered 2023-06-19 (×5): 20 mg via INTRAVENOUS

## 2023-06-19 MED ORDER — LIDOCAINE 2% (20 MG/ML) 5 ML SYRINGE
INTRAMUSCULAR | Status: DC | PRN
  Administered 2023-06-19: 100 mg via INTRAVENOUS

## 2023-06-19 MED ORDER — SODIUM CHLORIDE 0.9 % IV SOLN: INTRAVENOUS | Status: DC

## 2023-06-19 SURGICAL SUPPLY — 20 items
ELECT REM PT RETURN 9FT ADLT (ELECTROSURGICAL)
ELECTRODE REM PT RTRN 9FT ADLT (ELECTROSURGICAL) IMPLANT
FLOOR PAD 36X40 (MISCELLANEOUS) ×2
FORCEPS BIOP RAD 4 LRG CAP 4 (CUTTING FORCEPS) IMPLANT
FORCEPS BIOP RJ4 240 W/NDL (CUTTING FORCEPS)
FORCEPS BXJMBJMB 240X2.8X (CUTTING FORCEPS) IMPLANT
INJECTOR/SNARE I SNARE (MISCELLANEOUS) IMPLANT
LUBRICANT JELLY 4.5OZ STERILE (MISCELLANEOUS) IMPLANT
MANIFOLD NEPTUNE II (INSTRUMENTS) IMPLANT
NDL SCLEROTHERAPY 25GX240 (NEEDLE) IMPLANT
NEEDLE SCLEROTHERAPY 25GX240 (NEEDLE)
PAD FLOOR 36X40 (MISCELLANEOUS) ×2 IMPLANT
PROBE APC STR FIRE (PROBE) IMPLANT
PROBE INJECTION GOLD 7FR (MISCELLANEOUS) IMPLANT
SNARE ROTATE MED OVAL 20MM (MISCELLANEOUS) IMPLANT
SYR 50ML LL SCALE MARK (SYRINGE) IMPLANT
TRAP SPECIMEN MUCOUS 40CC (MISCELLANEOUS) IMPLANT
TUBING ENDO SMARTCAP PENTAX (MISCELLANEOUS) IMPLANT
TUBING IRRIGATION ENDOGATOR (MISCELLANEOUS) ×2 IMPLANT
WATER STERILE IRR 1000ML POUR (IV SOLUTION) IMPLANT

## 2023-06-19 NOTE — Anesthesia Procedure Notes (Signed)
Date/Time: 06/19/2023 9:27 AM  Performed by: Florene Route, CRNAOxygen Delivery Method: Simple face mask

## 2023-06-19 NOTE — Transfer of Care (Signed)
Immediate Anesthesia Transfer of Care Note  Patient: Danielle Lin  Procedure(s) Performed: COLONOSCOPY WITH PROPOFOL  Patient Location: Endoscopy Unit  Anesthesia Type:MAC  Level of Consciousness: awake and alert   Airway & Oxygen Therapy: Patient Spontanous Breathing and Patient connected to face mask oxygen  Post-op Assessment: Report given to RN and Post -op Vital signs reviewed and stable  Post vital signs: Reviewed and stable  Last Vitals:  Vitals Value Taken Time  BP 119/49 06/19/23 0956  Temp    Pulse 72 06/19/23 0958  Resp 14 06/19/23 0958  SpO2 100 % 06/19/23 0958  Vitals shown include unfiled device data.  Last Pain:  Vitals:   06/19/23 0824  TempSrc: Temporal  PainSc: 0-No pain         Complications: No notable events documented.

## 2023-06-19 NOTE — Discharge Instructions (Signed)
Colonoscopy ° °Post procedure instructions: ° °Read the instructions outlined below and refer to this sheet in the next few weeks. These discharge instructions provide you with general information on caring for yourself after you leave the hospital. Your doctor may also give you specific instructions. While your treatment has been planned according to the most current medical practices available, unavoidable complications occasionally occur. If you have any problems or questions after discharge, call Dr. Outlaw at [facility] ([phone]). ° °HOME CARE INSTRUCTIONS ° °ACTIVITY: °· You may resume your regular activity, but move at a slower pace for the next 24 hours.  °· Take frequent rest periods for the next 24 hours.  °· Walking will help get rid of the air and reduce the bloated feeling in your belly (abdomen).  °· No driving for 24 hours (because of the medicine (anesthesia) used during the test).  °· You may shower.  °· Do not sign any important legal documents or operate any machinery for 24 hours (because of the anesthesia used during the test).  °NUTRITION: °· Drink plenty of fluids.  °· You may resume your normal diet as instructed by your doctor.  °· Begin with a light meal and progress to your normal diet. Heavy or fried foods are harder to digest and may make you feel sick to your stomach (nauseated).  °· Avoid alcoholic beverages for 24 hours or as instructed.  °MEDICATIONS: °· You may resume your normal medications unless your doctor tells you otherwise.  °WHAT TO EXPECT TODAY: °· Some feelings of bloating in the abdomen.  °· Passage of more gas than usual.  °· Spotting of blood in your stool or on the toilet paper.  °IF YOU HAD POLYPS REMOVED DURING THE COLONOSCOPY: °· No aspirin products for 7 days or as instructed.  °· No alcohol for 7 days or as instructed.  °· Eat a soft diet for the next 24 hours.  ° °FINDING OUT THE RESULTS OF YOUR TEST ° °Not all test results are available during your  visit. If your test results are not back during the visit, make an appointment with your caregiver to find out the results. Do not assume everything is normal if you have not heard from your caregiver or the medical facility. It is important for you to follow up on all of your test results.  ° ° ° °SEEK IMMEDIATE MEDICAL CARE IF: ° °· You have more than a spotting of blood in your stool.  °· Your belly is swollen (abdominal distention).  °· You are nauseated or vomiting.  °· You have a fever.  °· You have abdominal pain or discomfort that is severe or gets worse throughout the day.  ° ° °Document Released: 11/29/2003 Document Revised: 12/27/2010 Document Reviewed: 11/27/2007 °Exit[employer]® Patient Information ©2012 [employer]. ° °

## 2023-06-19 NOTE — Op Note (Signed)
[facility] Patient Name: Danielle Lin Procedure Date: 06/19/2023 MRN: [id number] Attending MD: Willis Modena , MD, [id number] Date of Birth: 10-Jul-1975 CSN: [id number] Age: 48 Admit Type: Outpatient Procedure:                Colonoscopy Indications:              Screening for colorectal malignant neoplasm, This                            is the patient's first colonoscopy Providers:                Willis Modena, MD, Fransisca Connors, Rhodia Albright,                            Technician Referring MD:              Medicines:                Monitored Anesthesia Care Complications:            No immediate complications. Estimated Blood Loss:     Estimated blood loss: none. Procedure:                Pre-Anesthesia Assessment:                           - Prior to the procedure, a History and Physical                            was performed, and patient medications and                            allergies were reviewed. The patient's tolerance of                            previous anesthesia was also reviewed. The risks                            and benefits of the procedure and the sedation                            options and risks were discussed with the patient.                            All questions were answered, and informed consent                            was obtained. Prior Anticoagulants: The patient has                            taken no anticoagulant or antiplatelet agents. ASA                            Grade Assessment: III - A patient with severe                            systemic  disease. After reviewing the risks and                            benefits, the patient was deemed in satisfactory                            condition to undergo the procedure.                           After obtaining informed consent, the colonoscope                            was passed under direct vision. Throughout the                            procedure, the  patient's blood pressure, pulse, and                            oxygen saturations were monitored continuously. The                            [id number]-HQ190L ([id number]) Olympus colonoscope was                            introduced through the anus and advanced to the the                            cecum, identified by appendiceal orifice and                            ileocecal valve. The ileocecal valve, appendiceal                            orifice, and rectum were photographed. The entire                            colon was examined. The colonoscopy was performed                            without difficulty. The patient tolerated the                            procedure well. The quality of the bowel                            preparation was good. Scope In: 9:36:18 AM Scope Out: 9:51:41 AM Scope Withdrawal Time: 0 hours 10 minutes 18 seconds  Total Procedure Duration: 0 hours 15 minutes 23 seconds  Findings:      The perianal and digital rectal examinations were normal.      A 3 mm polyp was found in the transverse colon. The polyp was sessile.       The polyp was removed with a cold biopsy forceps. Resection and       retrieval were complete.      The exam was  otherwise without abnormality on direct and retroflexion       views. Impression:               - One 3 mm polyp in the transverse colon, removed                            with a cold biopsy forceps. Resected and retrieved.                           - The examination was otherwise normal on direct                            and retroflexion views. Moderate Sedation:      None Recommendation:           - Patient has a contact number available for                            emergencies. The signs and symptoms of potential                            delayed complications were discussed with the                            patient. Return to normal activities tomorrow.                            Written discharge instructions were  provided to the                            patient.                           - Discharge patient to home (via wheelchair).                           - Resume previous diet today.                           - Continue present medications.                           - Await pathology results.                           - Repeat colonoscopy (date not yet determined) for                            surveillance based on pathology results.                           - Return to GI clinic PRN.                           - Return to referring physician as previously  scheduled. Procedure Code(s):        --- Professional ---                           [phone], Colonoscopy, flexible; with biopsy, single                            or multiple Diagnosis Code(s):        --- Professional ---                           Z12.11, Encounter for screening for malignant                            neoplasm of colon                           D12.3, Benign neoplasm of transverse colon (hepatic                            flexure or splenic flexure) CPT copyright 2022 [employer]. All rights reserved. The codes documented in this report are preliminary and upon coder review may  be revised to meet current compliance requirements. Willis Modena, MD 06/19/2023 10:04:41 AM This report has been signed electronically. Number of Addenda: 0

## 2023-06-19 NOTE — H&P (Signed)
[facility] H/P Note  Chief Complaint: colon cancer screening  HPI: Danielle Lin is an 48 y.o. female.  Here colon cancer screening.  No prior colonoscopy.  Grandparent possible colon cancer.  Brother colon polyps.  No abdominal pain, change in bowel habits, blood in stool, unintentional weight loss.  Past Medical History:  Diagnosis Date   Anxiety    Arthritis    Asthma    Depression    Diabetes mellitus type 2, uncomplicated (HCC)    pt reports pre DM before weight lose surgery   GERD without esophagitis    Heavy menses    Hypertension    IDA (iron deficiency anemia)    Obstructive sleep apnea on CPAP     Past Surgical History:  Procedure Laterality Date   arm surgery Left    BARIATRIC SURGERY     CARPAL TUNNEL RELEASE Bilateral    CHOLECYSTECTOMY     GASTROPLASTY DUODENAL SWITCH     KNEE ARTHROSCOPY Left 02/19/2022   Procedure: ARTHROSCOPY KNEE CHONDRAL DEBRIDEMENT MEDIAL AND LATERAL COMPARTMENT;  Surgeon: Jodi Geralds, MD;  Location: [facility];  Service: Orthopedics;  Laterality: Left;    Medications Prior to Admission  Medication Sig Dispense Refill   acetaminophen (TYLENOL) 500 MG tablet Take 1,000 mg by mouth every 6 (six) hours as needed for moderate pain.     buPROPion (WELLBUTRIN XL) 300 MG 24 hr tablet Take 300 mg by mouth daily.     CALCIUM-VITAMIN D PO Take 2 tablets by mouth daily.     citalopram (CELEXA) 40 MG tablet Take 40 mg by mouth daily.     Cyanocobalamin (B-12 SL) Place 1 tablet under the tongue daily.     furosemide (LASIX) 20 MG tablet Take 20 mg by mouth daily.     hydrOXYzine (ATARAX) 25 MG tablet Take 25-50 mg by mouth daily.     Iron-Vit C-Vit B12-Folic Acid 100-250-0.025-1 MG TABS Take 1 tablet by mouth daily. 30 each 2   Multiple Vitamin (MULTI-VITAMIN) tablet Take 1 tablet by mouth daily.     NON FORMULARY Pt uses a cpap nightly     omeprazole (PRILOSEC) 20 MG capsule Take 20 mg by mouth 2 (two) times daily before a meal.      ondansetron (ZOFRAN) 4 MG tablet Take 4 mg by mouth every 8 (eight) hours as needed for vomiting or nausea.     tiZANidine (ZANAFLEX) 4 MG tablet Take 4 mg by mouth 2 (two) times daily as needed.     albuterol (VENTOLIN HFA) 108 (90 Base) MCG/ACT inhaler Inhale 1 puff into the lungs every 6 (six) hours as needed for wheezing or shortness of breath.     benzonatate (TESSALON) 200 MG capsule Take 200 mg by mouth 3 (three) times daily as needed.     fluconazole (DIFLUCAN) 150 MG tablet Take 1 tablet (150 mg total) by mouth daily. Take one tablet today.  May repeat in 3 days. 2 tablet 0   levonorgestrel (LILETTA) 19.5 MCG/DAY IUD IUD 1 each by Intrauterine route once.     meloxicam (MOBIC) 15 MG tablet Take 15 mg by mouth daily.     nystatin cream (MYCOSTATIN) Apply to affected area 2 times daily 30 g 2   traMADol (ULTRAM) 50 MG tablet Take by mouth every 6 (six) hours as needed.      Allergies:  Allergies  Allergen Reactions   Tetanus Toxoid Rash   Tetanus-Diphtheria Toxoids Td Rash    Raised rash and hardness  at injection site    Family History  Problem Relation Age of Onset   Bladder Cancer Mother    Uterine cancer Mother    Breast cancer Maternal Grandmother     Social History:  reports that she has never smoked. She has never used smokeless tobacco. She reports that she does not drink alcohol and does not use drugs.   ROS: As per HPI, all others negative   Blood pressure (!) 163/65, pulse 68, temperature 98 F (36.7 C), temperature source Temporal, resp. rate 16, height 4\' 11"  (1.499 m), weight 115.7 kg, SpO2 97%. General appearance: Overweight NECK:  Thick, supple HEENT:  Lillie/AT, anicteric sclera CV:  Regular LUNGS:  No visible distress ABD:  Soft, non-tender NEURO:  No encephalopathy  Results for orders placed or performed during the hospital encounter of 06/19/23 (from the past 48 hours)  Glucose, capillary     Status: Abnormal   Collection Time: 06/19/23  8:59 AM   Result Value Ref Range   Glucose-Capillary 108 (H) 70 - 99 mg/dL    Comment: Glucose reference range applies only to samples taken after fasting for at least 8 hours.   No results found.  Assessment/Plan   Colon cancer screening. Obesity, BMI > 50. Colonoscopy. Risks (bleeding, infection, bowel perforation that could require surgery, sedation-related changes in cardiopulmonary systems), benefits (identification and possible treatment of source of symptoms, exclusion of certain causes of symptoms), and alternatives (watchful waiting, radiographic imaging studies, empiric medical treatment) of colonoscopy were explained to patient/family in detail and patient wishes to proceed.   Danielle Lin 06/19/2023, 9:15 AM

## 2023-06-19 NOTE — Anesthesia Postprocedure Evaluation (Signed)
Anesthesia Post Note  Patient: Danielle Lin  Procedure(s) Performed: COLONOSCOPY WITH PROPOFOL     Patient location during evaluation: PACU Anesthesia Type: MAC Level of consciousness: awake and alert Pain management: pain level controlled Vital Signs Assessment: post-procedure vital signs reviewed and stable Respiratory status: spontaneous breathing, nonlabored ventilation and respiratory function stable Cardiovascular status: blood pressure returned to baseline and stable Postop Assessment: no apparent nausea or vomiting Anesthetic complications: no   No notable events documented.  Last Vitals:  Vitals:   06/19/23 1010 06/19/23 1020  BP: (!) 142/72 (!) 163/79  Pulse: 75 73  Resp: 17 19  Temp:    SpO2: 98% 97%    Last Pain:  Vitals:   06/19/23 1020  TempSrc:   PainSc: 0-No pain                 Lowella Curb

## 2023-06-19 NOTE — Anesthesia Preprocedure Evaluation (Signed)
Anesthesia Evaluation  Patient identified by MRN, date of birth, ID band Patient awake    Reviewed: Allergy & Precautions, NPO status , Patient's Chart, lab work & pertinent test results  Airway Mallampati: III  TM Distance: >3 FB Neck ROM: Full    Dental no notable dental hx. (+) Teeth Intact, Dental Advisory Given   Pulmonary asthma , sleep apnea and Continuous Positive Airway Pressure Ventilation    Pulmonary exam normal breath sounds clear to auscultation       Cardiovascular hypertension, Pt. on medications Normal cardiovascular exam Rhythm:Regular Rate:Normal     Neuro/Psych   Anxiety Depression       GI/Hepatic ,GERD  Medicated,,  Endo/Other  diabetes, Type 2  Class 4 obesity  Renal/GU Lab Results      Component                Value               Date                       K                        4.2                 02/15/2022                    Musculoskeletal  (+) Arthritis , Osteoarthritis,    Abdominal  (+) + obese  Peds  Hematology Lab Results      Component                Value               Date                            HGB                      12.9                02/15/2022                HCT                      40.8                02/15/2022                PLT                      366                 02/15/2022              Anesthesia Other Findings   Reproductive/Obstetrics                             Anesthesia Physical Anesthesia Plan  ASA: 3  Anesthesia Plan: MAC   Post-op Pain Management: Minimal or no pain anticipated   Induction: Intravenous  PONV Risk Score and Plan: 2 and Treatment may vary due to age or medical condition and Propofol infusion  Airway Management Planned: Simple Face Mask  Additional Equipment: None  Intra-op Plan:   Post-operative Plan:   Informed Consent: I have reviewed  the patients History and Physical, chart, labs and  discussed the procedure including the risks, benefits and alternatives for the proposed anesthesia with the patient or authorized representative who has indicated his/her understanding and acceptance.     Dental advisory given  Plan Discussed with:   Anesthesia Plan Comments:         Anesthesia Quick Evaluation

## 2023-06-20 LAB — SURGICAL PATHOLOGY

## 2023-06-21 ENCOUNTER — Encounter (HOSPITAL_COMMUNITY): Payer: Self-pay | Admitting: Gastroenterology

## 2023-08-09 ENCOUNTER — Other Ambulatory Visit: Payer: Self-pay | Admitting: Physician Assistant

## 2023-08-09 DIAGNOSIS — Z0000 Encounter for general adult medical examination without abnormal findings: Secondary | ICD-10-CM

## 2023-09-05 ENCOUNTER — Ambulatory Visit
Admission: RE | Admit: 2023-09-05 | Discharge: 2023-09-05 | Disposition: A | Discharge status: Home / Self Care | Source: Ambulatory Visit | Attending: Physician Assistant | Admitting: Physician Assistant

## 2023-09-05 DIAGNOSIS — Z0000 Encounter for general adult medical examination without abnormal findings: Secondary | ICD-10-CM

## 2024-05-28 ENCOUNTER — Encounter: Payer: Self-pay | Admitting: Internal Medicine

## 2024-05-28 ENCOUNTER — Ambulatory Visit: Admission: EM | Admit: 2024-05-28 | Discharge: 2024-05-28 | Disposition: A | Discharge status: Home / Self Care

## 2024-05-28 DIAGNOSIS — Z8709 Personal history of other diseases of the respiratory system: Secondary | ICD-10-CM | POA: Diagnosis not present

## 2024-05-28 DIAGNOSIS — B9689 Other specified bacterial agents as the cause of diseases classified elsewhere: Secondary | ICD-10-CM | POA: Diagnosis not present

## 2024-05-28 DIAGNOSIS — H109 Unspecified conjunctivitis: Secondary | ICD-10-CM

## 2024-05-28 DIAGNOSIS — J0100 Acute maxillary sinusitis, unspecified: Secondary | ICD-10-CM | POA: Diagnosis not present

## 2024-05-28 DIAGNOSIS — Z760 Encounter for issue of repeat prescription: Secondary | ICD-10-CM

## 2024-05-28 MED ORDER — ALBUTEROL SULFATE HFA 108 (90 BASE) MCG/ACT IN AERS: 1.0000 | INHALATION_SPRAY | Freq: Four times a day (QID) | RESPIRATORY_TRACT | 0 refills | Status: AC | PRN

## 2024-05-28 MED ORDER — AMOXICILLIN-POT CLAVULANATE 875-125 MG PO TABS: 1.0000 | ORAL_TABLET | Freq: Two times a day (BID) | ORAL | 0 refills | Status: AC

## 2024-05-28 MED ORDER — POLYMYXIN B-TRIMETHOPRIM 10000-0.1 UNIT/ML-% OP SOLN: 1.0000 [drp] | Freq: Four times a day (QID) | OPHTHALMIC | 0 refills | Status: AC

## 2024-05-28 NOTE — ED Triage Notes (Signed)
 Sinus pressure and pain, congestion, headache x 1 week. Bilateral eye redness, pt states she has green/yellow mucus in her eyes that drains x 1 day. Taking tylenol  sinus and headache.

## 2024-05-28 NOTE — ED Provider Notes (Signed)
 " [facility]    CSN: [id number] Arrival date & time: 05/28/24  1003      History   Chief Complaint Chief Complaint  Patient presents with   Facial Pain   Eye Drainage    HPI Danielle Lin is a 49 y.o. female.  Patient presents with 1 week history of congestion, sinus pressure, sinus pain, postnasal drainage, cough headache.  No fever, shortness of breath, wheezing.  She has been treating her symptoms with Tylenol  sinus medication.  She also presents with bilateral eye redness and purulent drainage x 1 day.  No treatments at home.  No eye trauma, change in vision, eye pain.  Patient also reports history of asthma.  Her albuterol  inhaler is expired and she needs a refill.  The history is provided by the patient and medical records.    Past Medical History:  Diagnosis Date   Anxiety    Arthritis    Asthma    Depression    Diabetes mellitus type 2, uncomplicated (HCC)    pt reports pre DM before weight lose surgery   GERD without esophagitis    Heavy menses    Hypertension    IDA (iron  deficiency anemia)    Obstructive sleep apnea on CPAP     Patient Active Problem List   Diagnosis Date Noted   Chronic pain of left knee 06/19/2022   Status post arthroscopic surgery of left knee 06/19/2022   Lymphedema 06/19/2022   Iron  deficiency anemia due to chronic blood loss 08/13/2018    Past Surgical History:  Procedure Laterality Date   arm surgery Left    BARIATRIC SURGERY     BIOPSY  06/19/2023   Procedure: BIOPSY;  Surgeon: Burnette Fallow, MD;  Location: [facility];  Service: Gastroenterology;;   CARPAL TUNNEL RELEASE Bilateral    CHOLECYSTECTOMY     COLONOSCOPY WITH PROPOFOL  N/A 06/19/2023   Procedure: COLONOSCOPY WITH PROPOFOL ;  Surgeon: Burnette Fallow, MD;  Location: [facility];  Service: Gastroenterology;  Laterality: N/A;   GASTROPLASTY DUODENAL SWITCH     KNEE ARTHROSCOPY Left 02/19/2022   Procedure: ARTHROSCOPY KNEE CHONDRAL DEBRIDEMENT MEDIAL AND  LATERAL COMPARTMENT;  Surgeon: Yvone Rush, MD;  Location: [facility];  Service: Orthopedics;  Laterality: Left;    OB History   No obstetric history on file.      Home Medications    Prior to Admission medications  Medication Sig Start Date End Date Taking? Authorizing Provider  albuterol  (VENTOLIN  HFA) 108 (90 Base) MCG/ACT inhaler Inhale 1-2 puffs into the lungs every 6 (six) hours as needed. 05/28/24  Yes Corlis Burnard DEL, NP  amoxicillin -clavulanate (AUGMENTIN ) 875-125 MG tablet Take 1 tablet by mouth every 12 (twelve) hours. 05/28/24  Yes Corlis Burnard DEL, NP  buPROPion (WELLBUTRIN XL) 300 MG 24 hr tablet Take 300 mg by mouth daily. 07/27/18  Yes [provider]  CALCIUM-VITAMIN D  PO Take 2 tablets by mouth daily.   Yes [provider]  Cyanocobalamin (B-12 SL) Place 1 tablet under the tongue daily.   Yes [provider]  furosemide (LASIX) 20 MG tablet Take 20 mg by mouth daily.   Yes [provider]  hydrOXYzine (ATARAX) 25 MG tablet Take 25-50 mg by mouth daily. 06/09/21  Yes [provider]  omeprazole (PRILOSEC) 20 MG capsule Take 20 mg by mouth 2 (two) times daily before a meal.   Yes [provider]  pregabalin (LYRICA) 75 MG capsule Take 75 milligrams twice daily, and can an additional  75 milligrams as needed 05/14/24  Yes [provider]  tirzepatide (ZEPBOUND) 5 MG/0.5ML Pen Inject 5 mg into the skin.   Yes [provider]  trimethoprim -polymyxin b  (POLYTRIM ) ophthalmic solution Place 1 drop into both eyes 4 (four) times daily for 7 days. 05/28/24 06/04/24 Yes Corlis Burnard DEL, NP  venlafaxine XR (EFFEXOR-XR) 75 MG 24 hr capsule Take 75 mg by mouth.   Yes [provider]  acetaminophen  (TYLENOL ) 500 MG tablet Take 1,000 mg by mouth every 6 (six) hours as needed for moderate pain.    [provider]  benzonatate (TESSALON) 200 MG capsule Take 200 mg by mouth 3 (three) times daily as needed. 07/02/22    [provider]  citalopram (CELEXA) 40 MG tablet Take 40 mg by mouth daily. Patient not taking: Reported on 05/28/2024    [provider]  fluconazole  (DIFLUCAN ) 150 MG tablet Take 1 tablet (150 mg total) by mouth daily. Take one tablet today.  May repeat in 3 days. 07/21/22   Corlis Burnard DEL, NP  Iron -Vit C-Vit B12-Folic Acid  100-250-0.025-1 MG TABS Take 1 tablet by mouth daily. 07/11/18   Viviann Pastor, MD  levonorgestrel (LILETTA) 19.5 MCG/DAY IUD IUD 1 each by Intrauterine route once. 09/08/18 09/07/24  [provider]  meloxicam (MOBIC) 15 MG tablet Take 15 mg by mouth daily.    [provider]  Multiple Vitamin (MULTI-VITAMIN) tablet Take 1 tablet by mouth daily.    [provider]  NON FORMULARY Pt uses a cpap nightly    [provider]  nystatin  cream (MYCOSTATIN ) Apply to affected area 2 times daily 07/21/22   Corlis Burnard DEL, NP  ondansetron  (ZOFRAN ) 4 MG tablet Take 4 mg by mouth every 8 (eight) hours as needed for vomiting or nausea. 07/22/15   [provider]  tiZANidine (ZANAFLEX) 4 MG tablet Take 4 mg by mouth 2 (two) times daily as needed. 06/14/22   [provider]  traMADol  (ULTRAM ) 50 MG tablet Take by mouth every 6 (six) hours as needed.    [provider]    Family History Family History  Problem Relation Age of Onset   Bladder Cancer Mother    Uterine cancer Mother    Breast cancer Maternal Grandmother     Social History Social History[1]   Allergies   Tetanus toxoid and Tetanus-diphtheria toxoids td   Review of Systems Review of Systems  Constitutional:  Negative for chills and fever.  HENT:  Positive for congestion, postnasal drip, rhinorrhea, sinus pressure and sinus pain. Negative for ear pain and sore throat.   Eyes:  Positive for discharge and redness. Negative for pain and visual disturbance.  Respiratory:  Positive for cough. Negative for shortness of breath.      Physical  Exam Triage Vital Signs ED Triage Vitals  Encounter Vitals Group     BP 05/28/24 1040 136/83     Girls Systolic BP Percentile --      Girls Diastolic BP Percentile --      Boys Systolic BP Percentile --      Boys Diastolic BP Percentile --      Pulse Rate 05/28/24 1040 76     Resp 05/28/24 1040 18     Temp 05/28/24 1040 97.7 F (36.5 C)     Temp src --      SpO2 05/28/24 1040 95 %     Weight --      Height --      Head  Circumference --      Peak Flow --      Pain Score 05/28/24 1101 8     Pain Loc --      Pain Education --      Exclude from Growth Chart --    No data found.  Updated Vital Signs BP 136/83   Pulse 76   Temp 97.7 F (36.5 C)   Resp 18   LMP  (LMP Unknown)   SpO2 95%   Visual Acuity Right Eye Distance: 20/25 Left Eye Distance: 20/25 Bilateral Distance: 20/25  Right Eye Near:   Left Eye Near:    Bilateral Near:     Physical Exam Constitutional:      General: She is not in acute distress. HENT:     Right Ear: Tympanic membrane normal.     Left Ear: Tympanic membrane normal.     Nose: Congestion and rhinorrhea present.     Mouth/Throat:     Mouth: Mucous membranes are moist.     Pharynx: Oropharynx is clear.  Eyes:     General: Lids are normal. Vision grossly intact.     Extraocular Movements: Extraocular movements intact.     Conjunctiva/sclera:     Right eye: Right conjunctiva is injected.     Left eye: Left conjunctiva is injected.     Pupils: Pupils are equal, round, and reactive to light.     Comments: Conjunctiva injected, R>L.  Cardiovascular:     Rate and Rhythm: Normal rate and regular rhythm.     Heart sounds: Normal heart sounds.  Pulmonary:     Effort: Pulmonary effort is normal. No respiratory distress.     Breath sounds: Normal breath sounds. No wheezing.  Neurological:     Mental Status: She is alert.      UC Treatments / Results  Labs (all labs ordered are listed, but only abnormal results are displayed) Labs  Reviewed - No data to display  EKG   Radiology No results found.  Procedures Procedures (including critical care time)  Medications Ordered in UC Medications - No data to display  Initial Impression / Assessment and Plan / UC Course  I have reviewed the triage vital signs and the nursing notes.  Pertinent labs & imaging results that were available during my care of the patient were reviewed by me and considered in my medical decision making (see chart for details).    Acute sinusitis, history of asthma, medication refill, bilateral bacterial conjunctivitis.  Afebrile and vital signs are stable.  Lungs are clear and O2 sat is 95% on room air.  Patient's sinus symptoms have been present for a week and are not improving with OTC treatment.  Treating today with Augmentin .  She has history of asthma and her albuterol  inhaler is expired.  Albuterol  inhaler sent to pharmacy for use as needed for asthma.  Treating conjunctivitis with Polytrim  eyedrops.  Instructed patient to follow-up with her PCP.  ED precautions given.  Education provided on sinus infection and bacterial conjunctivitis.  She agrees to plan of care.  Final Clinical Impressions(s) / UC Diagnoses   Final diagnoses:  Acute non-recurrent maxillary sinusitis  History of asthma  Bacterial conjunctivitis of both eyes  Medication refill     Discharge Instructions      Take the Augmentin  as directed for your sinus infection.    A refill of your albuterol  inhaler was sent to your pharmacy.  Use this as directed for your asthma.  Use the eyedrops as directed for pinkeye.    Follow up with your primary care provider.  Go to the emergency department if you have worsening symptoms.        ED Prescriptions     Medication Sig Dispense Auth. Provider   amoxicillin -clavulanate (AUGMENTIN ) 875-125 MG tablet Take 1 tablet by mouth every 12 (twelve) hours. 14 tablet Corlis Burnard DEL, NP   trimethoprim -polymyxin b  (POLYTRIM )  ophthalmic solution Place 1 drop into both eyes 4 (four) times daily for 7 days. 10 mL Corlis Burnard DEL, NP   albuterol  (VENTOLIN  HFA) 108 (90 Base) MCG/ACT inhaler Inhale 1-2 puffs into the lungs every 6 (six) hours as needed. 18 g Corlis Burnard DEL, NP      PDMP not reviewed this encounter.    [1]  Social History Tobacco Use   Smoking status: Never   Smokeless tobacco: Never  Vaping Use   Vaping status: Never Used  Substance Use Topics   Alcohol use: No   Drug use: No     Corlis Burnard DEL, NP 05/28/24 1154  "

## 2024-05-28 NOTE — Discharge Instructions (Addendum)
 Take the Augmentin  as directed for your sinus infection.    A refill of your albuterol  inhaler was sent to your pharmacy.  Use this as directed for your asthma.    Use the eyedrops as directed for pinkeye.    Follow up with your primary care provider.  Go to the emergency department if you have worsening symptoms.
